# Patient Record
Sex: Female | Born: 1955 | Race: White | Hispanic: No | Marital: Married | Smoking: Current every day smoker
Health system: Southern US, Community
[De-identification: ages and names within clinical notes are randomized; demographics above are authoritative.]

## PROBLEM LIST (undated history)

## (undated) ENCOUNTER — Emergency Department (HOSPITAL_COMMUNITY): Discharge status: Absent without leave | Payer: Medicare Other

## (undated) DIAGNOSIS — F32A Depression, unspecified: Secondary | ICD-10-CM

## (undated) DIAGNOSIS — F1210 Cannabis abuse, uncomplicated: Secondary | ICD-10-CM

## (undated) DIAGNOSIS — Z973 Presence of spectacles and contact lenses: Secondary | ICD-10-CM

## (undated) DIAGNOSIS — R918 Other nonspecific abnormal finding of lung field: Secondary | ICD-10-CM

## (undated) DIAGNOSIS — G8929 Other chronic pain: Secondary | ICD-10-CM

## (undated) DIAGNOSIS — T148XXA Other injury of unspecified body region, initial encounter: Secondary | ICD-10-CM

## (undated) DIAGNOSIS — Z720 Tobacco use: Secondary | ICD-10-CM

## (undated) DIAGNOSIS — M545 Low back pain, unspecified: Secondary | ICD-10-CM

## (undated) DIAGNOSIS — M1990 Unspecified osteoarthritis, unspecified site: Secondary | ICD-10-CM

## (undated) DIAGNOSIS — M542 Cervicalgia: Secondary | ICD-10-CM

## (undated) DIAGNOSIS — R519 Headache, unspecified: Secondary | ICD-10-CM

## (undated) DIAGNOSIS — J449 Chronic obstructive pulmonary disease, unspecified: Secondary | ICD-10-CM

## (undated) DIAGNOSIS — Z972 Presence of dental prosthetic device (complete) (partial): Secondary | ICD-10-CM

## (undated) DIAGNOSIS — R0600 Dyspnea, unspecified: Secondary | ICD-10-CM

## (undated) DIAGNOSIS — M76899 Other specified enthesopathies of unspecified lower limb, excluding foot: Secondary | ICD-10-CM

## (undated) DIAGNOSIS — M79609 Pain in unspecified limb: Secondary | ICD-10-CM

## (undated) DIAGNOSIS — M1710 Unilateral primary osteoarthritis, unspecified knee: Secondary | ICD-10-CM

## (undated) DIAGNOSIS — N059 Unspecified nephritic syndrome with unspecified morphologic changes: Secondary | ICD-10-CM

## (undated) HISTORY — DX: Pain in unspecified limb: M79.609

## (undated) HISTORY — PX: TONSILLECTOMY: SUR1361

## (undated) HISTORY — PX: ABDOMINAL HYSTERECTOMY: SHX81

## (undated) HISTORY — DX: Other specified enthesopathies of unspecified lower limb, excluding foot: M76.899

## (undated) HISTORY — DX: Unilateral primary osteoarthritis, unspecified knee: M17.10

## (undated) HISTORY — DX: Low back pain, unspecified: M54.50

## (undated) HISTORY — DX: Cervicalgia: M54.2

## (undated) HISTORY — DX: Low back pain: M54.5

## (undated) HISTORY — PX: BREAST BIOPSY: SHX20

## (undated) HISTORY — PX: BACK SURGERY: SHX140

## (undated) HISTORY — PX: HEMORRHOID SURGERY: SHX153

---

## 1998-09-07 ENCOUNTER — Ambulatory Visit (HOSPITAL_COMMUNITY): Admission: RE | Admit: 1998-09-07 | Discharge: 1998-09-07 | Payer: Self-pay | Admitting: Orthopedic Surgery

## 1998-10-18 ENCOUNTER — Ambulatory Visit (HOSPITAL_COMMUNITY): Admission: RE | Admit: 1998-10-18 | Discharge: 1998-10-18 | Payer: Self-pay | Admitting: Orthopedic Surgery

## 1998-10-18 ENCOUNTER — Encounter: Payer: Self-pay | Admitting: Orthopedic Surgery

## 1999-07-27 ENCOUNTER — Encounter: Payer: Self-pay | Admitting: Orthopedic Surgery

## 1999-07-27 ENCOUNTER — Encounter (INDEPENDENT_AMBULATORY_CARE_PROVIDER_SITE_OTHER): Payer: Self-pay | Admitting: Specialist

## 1999-07-27 ENCOUNTER — Inpatient Hospital Stay (HOSPITAL_COMMUNITY): Admission: RE | Admit: 1999-07-27 | Discharge: 1999-07-30 | Payer: Self-pay | Admitting: Orthopedic Surgery

## 2000-05-09 ENCOUNTER — Other Ambulatory Visit: Admission: RE | Admit: 2000-05-09 | Discharge: 2000-05-09 | Payer: Self-pay | Admitting: Internal Medicine

## 2001-05-23 ENCOUNTER — Other Ambulatory Visit: Admission: RE | Admit: 2001-05-23 | Discharge: 2001-05-23 | Payer: Self-pay | Admitting: Internal Medicine

## 2001-11-05 ENCOUNTER — Encounter (INDEPENDENT_AMBULATORY_CARE_PROVIDER_SITE_OTHER): Payer: Self-pay | Admitting: Specialist

## 2001-11-05 ENCOUNTER — Encounter: Payer: Self-pay | Admitting: Orthopedic Surgery

## 2001-11-06 ENCOUNTER — Inpatient Hospital Stay (HOSPITAL_COMMUNITY): Admission: EM | Admit: 2001-11-06 | Discharge: 2001-11-07 | Payer: Self-pay | Admitting: Orthopedic Surgery

## 2002-07-26 ENCOUNTER — Emergency Department (HOSPITAL_COMMUNITY): Admission: EM | Admit: 2002-07-26 | Discharge: 2002-07-27 | Payer: Self-pay | Admitting: Emergency Medicine

## 2002-12-28 ENCOUNTER — Other Ambulatory Visit: Admission: RE | Admit: 2002-12-28 | Discharge: 2002-12-28 | Payer: Self-pay | Admitting: Obstetrics and Gynecology

## 2003-01-06 ENCOUNTER — Ambulatory Visit (HOSPITAL_COMMUNITY): Admission: RE | Admit: 2003-01-06 | Discharge: 2003-01-06 | Payer: Self-pay | Admitting: General Surgery

## 2003-01-06 ENCOUNTER — Encounter: Payer: Self-pay | Admitting: General Surgery

## 2003-01-26 ENCOUNTER — Encounter (INDEPENDENT_AMBULATORY_CARE_PROVIDER_SITE_OTHER): Payer: Self-pay

## 2003-01-26 ENCOUNTER — Ambulatory Visit (HOSPITAL_COMMUNITY): Admission: RE | Admit: 2003-01-26 | Discharge: 2003-01-26 | Payer: Self-pay | Admitting: General Surgery

## 2003-04-05 ENCOUNTER — Encounter: Admission: RE | Admit: 2003-04-05 | Discharge: 2003-04-05 | Payer: Self-pay | Admitting: Neurosurgery

## 2003-04-05 ENCOUNTER — Encounter: Payer: Self-pay | Admitting: Neurosurgery

## 2003-05-12 ENCOUNTER — Encounter: Payer: Self-pay | Admitting: Neurosurgery

## 2003-05-17 ENCOUNTER — Inpatient Hospital Stay (HOSPITAL_COMMUNITY): Admission: RE | Admit: 2003-05-17 | Discharge: 2003-05-21 | Payer: Self-pay | Admitting: Neurosurgery

## 2003-05-17 ENCOUNTER — Encounter: Payer: Self-pay | Admitting: Neurosurgery

## 2003-06-22 ENCOUNTER — Encounter: Admission: RE | Admit: 2003-06-22 | Discharge: 2003-06-22 | Payer: Self-pay | Admitting: Neurosurgery

## 2003-06-22 ENCOUNTER — Encounter: Payer: Self-pay | Admitting: Neurosurgery

## 2003-07-14 ENCOUNTER — Ambulatory Visit (HOSPITAL_COMMUNITY): Admission: RE | Admit: 2003-07-14 | Discharge: 2003-07-14 | Payer: Self-pay | Admitting: Neurosurgery

## 2003-08-02 ENCOUNTER — Other Ambulatory Visit: Admission: RE | Admit: 2003-08-02 | Discharge: 2003-08-02 | Payer: Self-pay | Admitting: Obstetrics and Gynecology

## 2003-08-26 ENCOUNTER — Encounter: Admission: RE | Admit: 2003-08-26 | Discharge: 2003-08-26 | Payer: Self-pay | Admitting: Neurosurgery

## 2003-08-26 ENCOUNTER — Encounter: Payer: Self-pay | Admitting: Neurosurgery

## 2003-12-28 ENCOUNTER — Encounter: Admission: RE | Admit: 2003-12-28 | Discharge: 2003-12-28 | Payer: Self-pay | Admitting: Neurosurgery

## 2004-08-22 ENCOUNTER — Emergency Department (HOSPITAL_COMMUNITY): Admission: EM | Admit: 2004-08-22 | Discharge: 2004-08-22 | Payer: Self-pay | Admitting: *Deleted

## 2004-08-24 ENCOUNTER — Other Ambulatory Visit: Admission: RE | Admit: 2004-08-24 | Discharge: 2004-08-24 | Payer: Self-pay | Admitting: Obstetrics and Gynecology

## 2005-05-14 ENCOUNTER — Ambulatory Visit (HOSPITAL_BASED_OUTPATIENT_CLINIC_OR_DEPARTMENT_OTHER): Admission: RE | Admit: 2005-05-14 | Discharge: 2005-05-14 | Payer: Self-pay | Admitting: Otolaryngology

## 2005-05-14 ENCOUNTER — Ambulatory Visit (HOSPITAL_COMMUNITY): Admission: RE | Admit: 2005-05-14 | Discharge: 2005-05-14 | Payer: Self-pay | Admitting: Otolaryngology

## 2005-05-14 ENCOUNTER — Encounter (INDEPENDENT_AMBULATORY_CARE_PROVIDER_SITE_OTHER): Payer: Self-pay | Admitting: Specialist

## 2005-09-10 ENCOUNTER — Other Ambulatory Visit: Admission: RE | Admit: 2005-09-10 | Discharge: 2005-09-10 | Payer: Self-pay | Admitting: Obstetrics and Gynecology

## 2005-11-26 ENCOUNTER — Ambulatory Visit: Payer: Self-pay | Admitting: Internal Medicine

## 2005-12-13 ENCOUNTER — Ambulatory Visit: Payer: Self-pay | Admitting: Internal Medicine

## 2006-09-26 ENCOUNTER — Other Ambulatory Visit: Admission: RE | Admit: 2006-09-26 | Discharge: 2006-09-26 | Payer: Self-pay | Admitting: Obstetrics and Gynecology

## 2007-06-24 ENCOUNTER — Ambulatory Visit (HOSPITAL_COMMUNITY): Admission: RE | Admit: 2007-06-24 | Discharge: 2007-06-24 | Payer: Self-pay | Admitting: Anesthesiology

## 2008-01-14 ENCOUNTER — Encounter: Admission: RE | Admit: 2008-01-14 | Discharge: 2008-01-14 | Payer: Self-pay | Admitting: Internal Medicine

## 2008-03-15 ENCOUNTER — Emergency Department (HOSPITAL_COMMUNITY): Admission: EM | Admit: 2008-03-15 | Discharge: 2008-03-15 | Payer: Self-pay | Admitting: Family Medicine

## 2008-12-20 ENCOUNTER — Encounter: Admission: RE | Admit: 2008-12-20 | Discharge: 2008-12-20 | Payer: Self-pay | Admitting: Internal Medicine

## 2009-11-29 DIAGNOSIS — M489 Spondylopathy, unspecified: Secondary | ICD-10-CM | POA: Insufficient documentation

## 2009-11-29 DIAGNOSIS — F419 Anxiety disorder, unspecified: Secondary | ICD-10-CM | POA: Insufficient documentation

## 2009-11-29 DIAGNOSIS — E785 Hyperlipidemia, unspecified: Secondary | ICD-10-CM | POA: Insufficient documentation

## 2010-03-28 ENCOUNTER — Ambulatory Visit: Payer: Self-pay | Admitting: Internal Medicine

## 2010-04-06 ENCOUNTER — Ambulatory Visit (HOSPITAL_COMMUNITY): Admission: RE | Admit: 2010-04-06 | Discharge: 2010-04-06 | Payer: Self-pay | Admitting: Internal Medicine

## 2010-06-28 ENCOUNTER — Ambulatory Visit: Payer: Self-pay | Admitting: Internal Medicine

## 2010-09-01 ENCOUNTER — Encounter (INDEPENDENT_AMBULATORY_CARE_PROVIDER_SITE_OTHER): Payer: Self-pay | Admitting: Family Medicine

## 2010-09-01 ENCOUNTER — Ambulatory Visit: Payer: Self-pay | Admitting: Internal Medicine

## 2010-09-01 LAB — CONVERTED CEMR LAB
ALT: <8 units/L (ref 0–35)
AST: 12 units/L (ref 0–37)
Basophils Absolute: 0 10*3/uL (ref 0.0–0.1)
Basophils Relative: 0 % (ref 0–1)
Cholesterol: 157 mg/dL (ref 0–200)
Creatinine, Ser: 0.53 mg/dL (ref 0.40–1.20)
Eosinophils Absolute: 0.1 10*3/uL (ref 0.0–0.7)
Eosinophils Relative: 2 % (ref 0–5)
HCT: 43.8 % (ref 36.0–46.0)
Hemoglobin: 14.5 g/dL (ref 12.0–15.0)
MCHC: 33.1 g/dL (ref 30.0–36.0)
Monocytes Absolute: 0.5 10*3/uL (ref 0.1–1.0)
RDW: 13 % (ref 11.5–15.5)
TSH: 1.064 microintl units/mL (ref 0.350–4.500)
Total Bilirubin: 0.3 mg/dL (ref 0.3–1.2)
Total CHOL/HDL Ratio: 3.4
VLDL: 28 mg/dL (ref 0–40)
Vitamin B-12: >2000 pg/mL — ABNORMAL HIGH (ref 211–911)

## 2010-09-18 ENCOUNTER — Ambulatory Visit (HOSPITAL_COMMUNITY): Admission: RE | Admit: 2010-09-18 | Discharge: 2010-09-18 | Payer: Self-pay | Admitting: Internal Medicine

## 2010-10-27 ENCOUNTER — Encounter
Admission: RE | Admit: 2010-10-27 | Discharge: 2010-12-13 | Payer: Self-pay | Source: Home / Self Care | Attending: Physical Medicine and Rehabilitation | Admitting: Physical Medicine and Rehabilitation

## 2010-11-01 ENCOUNTER — Ambulatory Visit: Payer: Self-pay | Admitting: Physical Medicine and Rehabilitation

## 2010-11-15 ENCOUNTER — Ambulatory Visit: Payer: Self-pay | Admitting: Physical Medicine and Rehabilitation

## 2010-12-13 ENCOUNTER — Ambulatory Visit: Payer: Self-pay | Admitting: Physical Medicine and Rehabilitation

## 2011-01-19 ENCOUNTER — Encounter
Admission: RE | Admit: 2011-01-19 | Discharge: 2011-01-26 | Payer: Self-pay | Source: Home / Self Care | Attending: Physical Medicine and Rehabilitation | Admitting: Physical Medicine and Rehabilitation

## 2011-01-26 ENCOUNTER — Ambulatory Visit
Admission: RE | Admit: 2011-01-26 | Discharge: 2011-01-26 | Payer: Self-pay | Source: Home / Self Care | Attending: Physical Medicine and Rehabilitation | Admitting: Physical Medicine and Rehabilitation

## 2011-02-26 ENCOUNTER — Encounter: Payer: Self-pay | Attending: Physical Medicine and Rehabilitation

## 2011-02-26 ENCOUNTER — Ambulatory Visit (HOSPITAL_BASED_OUTPATIENT_CLINIC_OR_DEPARTMENT_OTHER): Payer: Self-pay | Admitting: Physical Medicine and Rehabilitation

## 2011-02-26 DIAGNOSIS — K5900 Constipation, unspecified: Secondary | ICD-10-CM | POA: Insufficient documentation

## 2011-02-26 DIAGNOSIS — M1710 Unilateral primary osteoarthritis, unspecified knee: Secondary | ICD-10-CM

## 2011-02-26 DIAGNOSIS — M542 Cervicalgia: Secondary | ICD-10-CM

## 2011-02-26 DIAGNOSIS — F17200 Nicotine dependence, unspecified, uncomplicated: Secondary | ICD-10-CM | POA: Insufficient documentation

## 2011-02-26 DIAGNOSIS — M545 Low back pain, unspecified: Secondary | ICD-10-CM

## 2011-02-26 DIAGNOSIS — G894 Chronic pain syndrome: Secondary | ICD-10-CM

## 2011-02-26 DIAGNOSIS — R059 Cough, unspecified: Secondary | ICD-10-CM | POA: Insufficient documentation

## 2011-02-26 DIAGNOSIS — R05 Cough: Secondary | ICD-10-CM | POA: Insufficient documentation

## 2011-02-26 DIAGNOSIS — M76899 Other specified enthesopathies of unspecified lower limb, excluding foot: Secondary | ICD-10-CM | POA: Insufficient documentation

## 2011-02-26 DIAGNOSIS — G5780 Other specified mononeuropathies of unspecified lower limb: Secondary | ICD-10-CM | POA: Insufficient documentation

## 2011-03-27 ENCOUNTER — Ambulatory Visit: Payer: Self-pay

## 2011-03-27 ENCOUNTER — Ambulatory Visit (HOSPITAL_BASED_OUTPATIENT_CLINIC_OR_DEPARTMENT_OTHER): Payer: Self-pay

## 2011-03-27 ENCOUNTER — Encounter: Payer: Self-pay | Attending: Physical Medicine and Rehabilitation

## 2011-03-27 DIAGNOSIS — M545 Low back pain, unspecified: Secondary | ICD-10-CM | POA: Insufficient documentation

## 2011-03-27 DIAGNOSIS — M1710 Unilateral primary osteoarthritis, unspecified knee: Secondary | ICD-10-CM | POA: Insufficient documentation

## 2011-03-27 DIAGNOSIS — Z79899 Other long term (current) drug therapy: Secondary | ICD-10-CM | POA: Insufficient documentation

## 2011-03-27 DIAGNOSIS — M542 Cervicalgia: Secondary | ICD-10-CM | POA: Insufficient documentation

## 2011-03-27 DIAGNOSIS — M25569 Pain in unspecified knee: Secondary | ICD-10-CM | POA: Insufficient documentation

## 2011-03-27 DIAGNOSIS — F17200 Nicotine dependence, unspecified, uncomplicated: Secondary | ICD-10-CM | POA: Insufficient documentation

## 2011-03-27 DIAGNOSIS — G8929 Other chronic pain: Secondary | ICD-10-CM | POA: Insufficient documentation

## 2011-03-27 DIAGNOSIS — M546 Pain in thoracic spine: Secondary | ICD-10-CM | POA: Insufficient documentation

## 2011-03-27 DIAGNOSIS — Z981 Arthrodesis status: Secondary | ICD-10-CM | POA: Insufficient documentation

## 2011-03-27 DIAGNOSIS — M2040 Other hammer toe(s) (acquired), unspecified foot: Secondary | ICD-10-CM | POA: Insufficient documentation

## 2011-03-27 DIAGNOSIS — M79609 Pain in unspecified limb: Secondary | ICD-10-CM

## 2011-04-24 ENCOUNTER — Encounter: Payer: Self-pay | Attending: Physical Medicine and Rehabilitation

## 2011-04-24 DIAGNOSIS — M25569 Pain in unspecified knee: Secondary | ICD-10-CM | POA: Insufficient documentation

## 2011-04-24 DIAGNOSIS — M545 Low back pain, unspecified: Secondary | ICD-10-CM | POA: Insufficient documentation

## 2011-04-24 DIAGNOSIS — M542 Cervicalgia: Secondary | ICD-10-CM

## 2011-04-24 DIAGNOSIS — Z981 Arthrodesis status: Secondary | ICD-10-CM | POA: Insufficient documentation

## 2011-04-24 DIAGNOSIS — M546 Pain in thoracic spine: Secondary | ICD-10-CM | POA: Insufficient documentation

## 2011-04-24 DIAGNOSIS — Z79899 Other long term (current) drug therapy: Secondary | ICD-10-CM | POA: Insufficient documentation

## 2011-04-24 DIAGNOSIS — G8929 Other chronic pain: Secondary | ICD-10-CM | POA: Insufficient documentation

## 2011-04-24 DIAGNOSIS — M1710 Unilateral primary osteoarthritis, unspecified knee: Secondary | ICD-10-CM

## 2011-04-24 DIAGNOSIS — M79609 Pain in unspecified limb: Secondary | ICD-10-CM | POA: Insufficient documentation

## 2011-05-18 NOTE — Op Note (Signed)
NAMEMEGHNA, HAGMANN              ACCOUNT NO.:  [id number]   MEDICAL RECORD NO.:  [id number]          PATIENT TYPE:  AMB   LOCATION:  DSC                          FACILITY:  [facility]   PHYSICIAN:  Jefry H. Pollyann Kennedy, MD     DATE OF BIRTH:  [birth year]   DATE OF PROCEDURE:  05/14/2005  DATE OF DISCHARGE:                                 OPERATIVE REPORT   PREOPERATIVE DIAGNOSIS:  Right attic cholesteatoma.   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   PROCEDURE:  Right tympanoplasty with mastoidectomy canal wall up.   SURGEON:  Dr. Pollyann Kennedy   ANESTHESIA:  General endotracheal anesthesia was used.   COMPLICATIONS:  None.   FINDINGS:  Large cholesteatoma sac with erosion of the scutum.  The sac was  filling the attic and the aditus and part of the superior mastoid cavity as  well as the anterior epitympanum.  The cholesteatoma was all lateral to the  body of the incus.  The ossicular chain was intact.  There were no  dehiscences of the otic capsule, tegmen, or the facial nerve.   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  She has a history of eustachian tube dysfunction in the past and  a had ventilation tube in that right ear several years back.  Risks,  benefits, alternatives, and complications of the procedure were explained to  the patient, and she did understand and agreed to surgery.   PROCEDURE:  The patient was taken to the operating room and placed on the  operating table in a supine position.  Following induction of general  endotracheal anesthesia, table was turned, and the patient was draped in a  standard fashion.  The right ear was draped for otologic surgery.  The  microscope was brought into view.  Xylocaine with epinephrine was  infiltrated into four quadrants of the external auditory canal as well as  the post auricular  sulcus.  Radial canal incisions were created with a  sickle knife at 4 o'clock and 8 o'clock.  A round knife was used to connect  the incisions lateral to the annulus.  The skin was very thin and inflamed  in this area, and the skin tore very easily during the incisions.  Postauricular incision was created, and the ear was dissected forward.  A  graft was harvested, loose areolar tissue superficial to the temporalis  fascia.  This was pressed and dried on the back table.  The linea and  temporalis and mastoid periosteum were incised with the scalpel, and the ear  was brought forward with the periosteum and the vascular strip.  Perkins  retractor was used to secure the flap in place.  The ear canal and drum were  then examined.  A large amount of crusting material was carefully dissected  off of the attic area using an otologic pick.  Behind this was a large  amount of cholesteatoma matrix.  A round knife was used to elevate the  tympanomeatal flap off the annulus.  The chorda tympani nerve was identified  and preserved.  The hypotympanum and mesotympanum were clear.  The  epitympanum was where all the disease was localized to.  The incus long  process, malleus long process, and stapes round window and oval window were  all clear of any disease.  The mastoidectomy was then accomplished.  A large  cutting bur was used to open the mastoid cortex.  The air cells were  entered.  The canal wall was preserved.  The cholesteatoma matrix was  identified in the aditus area, and this area was opened widely up to the  tegmen, keeping the tegmen intact and anteriorly into the root of the  zygoma.  Down on the canal wall as well, the bridge was opened, and the  facial recess was explored as well.  The external genu of the facial nerve  was without dehiscence, and the disease was all lateral to this and  superior.  The mastoid sac was carefully dissected out of the aditus and the  attic area.  The sac was  dissected off of the body of the incus without any  evidence of localization medial to the incus.  The anterior epitympanum was  also dissected free of all cholesteatoma matrix.  A large amount of matrix  was sent for pathologic evaluation.  Careful inspection revealed no further  evidence of epithelium in the area.  The mastoid cavity was then polished  with various sizes of diamond burs, clearing out all granulation tissue and  then planed mucosal tissue.  The mastoid cavity and middle ear were packed  with saline-soaked Gelfoam.  The graft was then placed medial to the  tympanic membrane remnant and up, covering the entire atticotomy defect up  onto the ear canal bone.  The native tympanomeatal flap was replaced to its  normal position.  The ear canal was packed with Ciprodex-soaked Gelfoam.  The mastoid periosteum was reapproximated with chromic suture.  The  postauricular incision was reapproximated with a chromic suture in a  subcuticular fashion.  Benzoin and Steri-Strips were applied.  The ear  canals then reinspected, and a vascular strip was laid down on the posterior  ear canal bone.  An additional load Gelfoam packing was used.  The external  meatus was then packed with Bacitracin and a cotton ball, and a mastoid  dressing was applied.  The patient was then awakened, extubated, and  transferred to recovery in stable condition.      JHR/MEDQ  D:  05/14/2005  T:  05/14/2005  Job:  [id number]

## 2011-05-18 NOTE — Op Note (Signed)
[facility]  Patient:    Alyssa [name], Alyssa [name] Visit Number: [id number] MRN: [id number]          Service Type: SUR Location: 4W [id number] 01 Attending Physician:  Skip Mayer Dictated by:   Georges Lynch Darrelyn Hillock, M.D. Proc. Date: 11/05/01 Admit Date:  11/05/2001                             Operative Report  PREOPERATIVE DIAGNOSES: 1. Large herniated disk, central and to the right at the L3-4. 2. Spinal stenosis at L3-4. 3. Previous central decompressive lumbar laminectomy and diskectomy at L4-5 by    Dr. Fannie Knee a few years ago.  POSTOPERATIVE DIAGNOSES: 1. Large herniated disk, central and to the right at the L3-4. 2. Spinal stenosis at L3-4. 3. Previous central decompressive lumbar laminectomy and diskectomy at L4-5 by    Dr. Fannie Knee a few years ago.  OPERATION: 1. Complete bilateral decompressive lumbar laminectomy at L3-4. 2. Foraminotomy at L3-4 bilaterally. 3. Microdiskectomy at L3-4 on the right.  SURGEON:  Georges Lynch. Darrelyn Hillock, M.D.  ASSISTANT:  Javier Docker, M.D.  DESCRIPTION OF PROCEDURE:  Under general anesthesia, the patient on the spinal frame, routine orthopedic prepping and draping of the back was carried out. She had 1 g of IV Ancef.  At this time, two needles were placed in the back for localization purposes.  Note, she did have previous surgery as I mentioned before by Dr. Fannie Knee a few years ago.  At this time, we went down and made the incision in the lower lumbar region after she had 1 g of IV Ancef.  Following this, the muscle was stripped from the lamina of L3 and L4 region, and we then inserted self-retaining retractors, and several x-rays were taken to verify our exact position.  We went down and removed the remaining part of the spinous process of L3, and a portion of 2 above, and went distally to remove all the scar tissue, and we did a complete bilateral decompressive lumbar laminectomy at L3-4.  There was quite a bit of  scarring over the dura.  We easily removed that, and removed the remaining ligamentum flavum in that area.  She had a marked thickening of the ligamentum flavum that was contributing to her spinal stenosis.  We experienced a sufficient amount of venous bleeding from the lateral recesses, and we were able to easily cauterize the veins with a bipolar.  We gently retracted the dura, identified the root at L3-4, and noted a large extruded disk.  We were able to easily tease out with the nerve hook.  Once this was done, and the nerve root was freed up, we then packed the area above and below with cottonoid, and went down and made a cruciate incision in the posterior longitudinal ligament, and did a complete diskectomy.  We went completely across the midline.  We utilized the hockey stick and the Epstein curet to clean out the space.  After this was done, we were able to easily pass the hockey stick up under the dura, as well as down into the foramen.  We also decompressed the opposite left side as well, but we did just decompress the lateral recess on the opposite side.  We thoroughly irrigated out the area.  We had good hemostasis. Several passes were made into the disk space to make sure there were no other loose pieces of disk in the space or  outside the space.  We then loosely applied some thrombin soaked Gelfoam.  We had good hemostasis.  We closed the wound in layers in the usual fashion.  A sterile Neosporin dressing was applied. Dictated by:   Georges Lynch Darrelyn Hillock, M.D. Attending Physician:  Skip Mayer DD:  11/05/01 TD:  11/06/01 Job: [id number]

## 2011-05-18 NOTE — H&P (Signed)
[facility]  Patient:    Alyssa [name], Alyssa [name] Visit Number: [id number] MRN: [id number]          Service Type: Attending:  Georges Lynch. Darrelyn Hillock, M.D. Dictated by:   Marcie Bal Troncale, P.A.C. Adm. Date:  11/05/01                           History and Physical  DATE OF BIRTH:  1956-03-02  SOCIAL SECURITY NUMBER:  [phone]  CHIEF COMPLAINT:  Low back pain.  HISTORY OF PRESENT ILLNESS:  Alyssa [name] is a 55 year old female who presents with complaints of lower back pain.  She has previously undergone a central decompressive laminectomy by Dr. Paul Dykes. Grant Ruts. at level L4-5 in the year 2000.  She had done well after this surgery.  However, November 13th of this summer, she ended up tripping and falling, re-injuring her lower back. She began having lower back pain as well as radicular symptoms into her right more than left leg, with numbing, tingling and some pain.  Because of her findings, she underwent an MRI study which showed a focal right-sided paracentral disk protrusion at L3-4 which was encroaching the nerve roots within the canal.  Because of this finding and her clinical symptoms, it was felt she may benefit from surgical intervention.  REVIEW OF SYSTEMS:  She reports a history of migraine headaches.  No fever or chills.  No diplopia or blurred vision.  Does report insomnia.  Currently having some rhinorrhea she feels secondary to her allergies.  No earache or sore throat.  No chest pain, shortness of breath or cough.  No abdominal pain, nausea, vomiting or diarrhea.  Does report constipation secondary to pain medications.  No melena or bright red blood per rectum.  No urinary frequency, dysuria or hematuria.  PAST MEDICAL HISTORY:  Significant for Brights disease (glomerulonephritis at age 51); also reports "nerves."  Denies CAD, MI, stroke, seizures, cancer, diabetes, hypertension or lung disease.  ALLERGIES:  CELEBREX, causing it to "hurt  her stomach."  CURRENT MEDICATIONS: 1. Vioxx. 2. Robaxin. 3. Percocet, which she thinks may be giving her nightmares. 4. Xanax 0.5 mg -- she takes one during the day and one at night.  PAST SURGICAL HISTORY:  As per the HPI as well as a hysterectomy and hemorrhoid and fistula surgery.  FAMILY HISTORY:  Significant for hypertension in her father.  Mother had arthritis, thyroid disorder and heart disease.  SOCIAL HISTORY:  She smokes a half a pack of cigarettes a day.  She denies alcohol use.  She is married and has two adult children who are alive and well.  Patient is a homemaker.  Dr. Pearletha Furl. Jacky Kindle is her medical physician.  PHYSICAL EXAMINATION:  VITALS:  She is afebrile.  Pulse 78.  Respiratory rate 16.  Blood pressure 130/88.  GENERAL:  This is a well-developed, well-nourished female in no acute distress.  HEENT:  Head is normocephalic, atraumatic.  Pupils are equal, round and reactive to light and extraocular movements are grossly intact.  Oropharynx clear without redness, exudate or lesions.  She does have dentures.  NECK:  Supple without no cervical lymphadenopathy.  CHEST:  Clear to auscultation bilaterally with no wheezes or crackles.  HEART:  Regular rate and rhythm with no murmurs, rubs, or gallops.  ABDOMEN:  Soft, nontender, nondistended, with no masses.  She has positive bowel sounds.  BREASTS:  Not examined as not pertinent to present illness.  GENITOURINARY:  Not examined as not pertinent to present illness.  EXTREMITIES:  She has a positive straight leg raise in both lower extremities, 2+ DP and PT pulses, 5/5 motor strength in both lower extremities.  Sensation is grossly intact to light touch.  IMPRESSION: 1. Herniated nucleus pulposus, L3-4. 2. "Nerves." 3. History of glomerulonephritis.  PLAN:  Patient will be admitted to [facility] to undergo a central laminectomy and microdiskectomy, L3-4, on November 05, 2001 by Dr. Georges Lynch. Gioffre.  Preoperative labs and signed surgical consents will be obtained.  All questions have been encouraged and answered. Dictated by:   Marcie Bal Troncale, P.A.C. Attending:  Georges Lynch. Darrelyn Hillock, M.D. DD:  10/30/01 TD:  10/30/01 Job: [id number]

## 2011-05-18 NOTE — Op Note (Signed)
[name], Alyssa [name]                        ACCOUNT NO.:  [id number]   MEDICAL RECORD NO.:  [id number]                   PATIENT TYPE:  AMB   LOCATION:  DAY                                  FACILITY:  [facility]   PHYSICIAN:  Timothy E. Earlene Plater, M.D.              DATE OF BIRTH:  November 02, 1956   DATE OF PROCEDURE:  01/26/2003  DATE OF DISCHARGE:                                 OPERATIVE REPORT   PREOPERATIVE DIAGNOSIS:  Mass, right breast.   POSTOPERATIVE DIAGNOSIS:  Mass, right breast.   PROCEDURE:  Excision of mass, right breast.   SURGEON:  Timothy E. Earlene Plater, M.D.   ANESTHESIA:  Local with standby.   INDICATIONS:  The patient is 65 and presented with a new painful mass in the  right breast.  Mammograms are negative.  CT scans of the chest and axilla  were, otherwise, unremarkable and because of her fear of cancer and the  presence of a painful mass, we elected to proceed with an excisional biopsy  at this time.  She is in full agreement.  She was seen, evaluated by  anesthesia and identified, and the site marked in the permanent zone.   DESCRIPTION OF PROCEDURE:  She was taken to the operating room, placed  supine and IV sedation given.  She was turned into the correct position and  the right breast was prepped and draped in the usual fashion.  Marcaine  0.25% with epinephrine was used throughout for local anesthesia.  The mass  was located high in the left upper outer quadrant in the tail of the breast  at the junction of the breast and axilla.   A curvilinear incision made in the skin lines, the subcutaneous tissue  dissected, the deep tissue identified.  Thickened fibroglandular tissue was  identified and this was completely removed in two segments.  Bleeding was  controlled with cautery.  Careful bimanual exploration of the wound and  axilla revealed no other questionable areas.  Bleeding controlled, the wound  closed in layers with 3-0 Monocryl, Steri-Strips applied, counts  correct,  and a dry sterile dressing applied.   She was removed to the recovery room in good condition.  Written verbal  instructions were given.  I gave her Darvocet-N 100 mg, #36, for pain. She  is on chronic pain medications, otherwise.  She will see Dr. Thyra Breed  tomorrow and return to Dr. Fredia Beets for her routine care.  Followup in my  office in two weeks.                                               Timothy E. Earlene Plater, M.D.    TED/MEDQ  D:  01/26/2003  T:  01/26/2003  Job:  [id number]   cc:   Geoffry Paradise, M.D.  Lavina Hamman, M.D.   Thyra Breed, M.D.

## 2011-05-18 NOTE — Discharge Summary (Signed)
   Alyssa [name], Alyssa [name]                        ACCOUNT NO.:  [id number]   MEDICAL RECORD NO.:  [id number]                   PATIENT TYPE:  INP   LOCATION:  3013                                 FACILITY:  [facility]   PHYSICIAN:  Kathaleen Maser. Pool, M.D.                 DATE OF BIRTH:  October 17, 1956   DATE OF ADMISSION:  05/17/2003  DATE OF DISCHARGE:  05/21/2003                                 DISCHARGE SUMMARY   SERVICE:  Neurosurgery.   FINAL DIAGNOSIS:  L3-4, L4-5, and L5-S1 degenerative disk disease/stenosis  with chronic pain.   OPERATIONS, TREATMENTS:  L3-4, L4-5, and L5-S1 decompression and fusion with  instrumentation.   HOSPITAL COURSE:  [name] was admitted to the hospital and taken to the  operating room where she underwent an uncomplicated L3-4, L4-5, and L5-S1  decompression and fusion with instrumentation.  Postoperatively she did very  well.  Her back and lower extremity pain are much improved.  Strength and  sensation are intact.  The wound is healing well.   CONDITION ON DISCHARGE:  Improved.   DISCHARGE DISPOSITION:  The patient will follow up in one week in my office.                                               Henry A. Pool, M.D.    HAP/MEDQ  D:  06/23/2003  T:  06/24/2003  Job:  [id number]

## 2011-05-18 NOTE — Op Note (Signed)
NAMEALANYA, VUKELICH                        ACCOUNT NO.:  [id number]   MEDICAL RECORD NO.:  [id number]                   PATIENT TYPE:  INP   LOCATION:  3172                                 FACILITY:  [facility]   PHYSICIAN:  Kathaleen Maser. Pool, M.D.                 DATE OF BIRTH:  [birth year]   DATE OF PROCEDURE:  05/17/2003  DATE OF DISCHARGE:                                 OPERATIVE REPORT   PREOPERATIVE DIAGNOSIS:  L3-4, L4-5 and L5-S1 degenerative disk disease with  instability and chronic pain.   POSTOPERATIVE DIAGNOSIS:  L3-4, L4-5 and L5-S1 degenerative disk disease  with instability and chronic pain.   OPERATION PERFORMED:  Re-exploration of L3-4 laminectomy with redo  laminectomy and foraminotomies.  L4-5 re-exploration of laminectomy with  decompressive foraminotomy.  L5-S1 decompressive laminectomy with  foraminotomy.  L3-4, L4-5 and L5-S1 posterior lumbar interbody fusion  utilizing tangent wedges and local autograft.  L3 through S1 posterolateral  fusion utilizing pedicle screw instrumentation and local autograft.   SURGEON:  Kathaleen Maser. Pool, M.D.   ASSISTANT:  Reinaldo Meeker, M.D.   ANESTHESIA:  General endotracheal.   INDICATIONS FOR PROCEDURE:  The patient is a 55 year old female with a  history of two previous lumbar laminectomies with chronic pain and radicular  symptoms failing all conservative management.  The patient has evidence of  severe facet arthropathy at L3-4 and L4-5 consistent with instability.  She  has severe degenerative disk disease at L3-4, L4-5 and most strikingly at L5-  S1.  We have discussed options available for management including the  possibility of undergoing three-level decompression, fusion and  instrumentation for hopeful improvement of the symptoms.  The patient is  aware of the possible risks and benefits of the surgery.  She wishes to  proceed with surgery.   DESCRIPTION OF PROCEDURE:  The patient was taken to the operating room  and  placed on the table in the supine position.  After adequate level of  anesthesia was achieved, the patient was positioned prone onto a Wilson  frame and appropriately padded.  The patient's lumbar region was prepped and  draped sterilely.  A 10 blade was used to make a linear skin incision  extending from L2 down to S1.  This was carried down sharply in the midline.  A subperiosteal dissection was then performed exposing the lamina of L2, L3,  L4, L5 and S1.  Deep self-retaining retractor was placed.  Intraoperative  fluoroscopy was used, the levels were confirmed.  The transverse processes  of L3, L4, L5 and the sacral ala were then dissected free bilaterally.  The  patient's previous laminectomy defects at L3-4 and L4-5 were dissected free  using dental instruments and the electrocautery.  Complete laminectomy was  then performed at L5 using Leksell rongeurs, Kerrison rongeurs and the high  speed drill to remove the entire lamina of L5 and the inferior  facet joint  complex at L5 bilaterally.  __________ facetectomy at S1 were also  performed.  Ligamentum flavum was then elevated and resected in piecemeal  fashion using Kerrison rongeurs.  The underlying thecal sac and exiting L5  and S1 nerve roots were identified.  Wide decompressive foraminotomies were  performed along the exiting course of the nerve roots.  Decompression then  proceeded to the L4-5 level.  The patient's wide laminectomy defect was  dissected free.  Residual lamina was removed using Leksell rongeurs,  Kerrison rongeurs and high speed drill.  Residual inferior facet joints were  removed bilaterally at L4 as well as residual L5 superior facet joint  complex.  Epidural scar was dissected free and removed.  The underlying  thecal sac and exiting L4 and L5 nerve roots were identified.  wide  foraminotomies were performed along the course of the exiting nerve roots.  Attention was then placed at L3-4.  Once again,  epidural scar was gradually  resected.  Complete laminectomy at L3 was performed as were complete  inferior facetectomies at L3.  Superior facetectomies at L4 were performed  bilaterally.  Ligamentum flavum and scar was elevated and resected piecemeal  fashion.  Underlying thecal sac and exiting L3 and L4 nerve roots were  identified and widely decompressed.  The epidural venous plexus was  coagulated and cut at all three levels.  Bilateral diskectomies were  performed at all three levels without difficulty.  Disk spaces were then  distracted at L3-4 on the left.  Thecal sac and nerve roots were protected  on the right.  Disk space was then reamed and then cut with a 10 mm tangent  chisel.  Soft tissues were removed from the interspace.  A 10 x 26 mm  tangent wedge was then impacted into place and recessed approximately 1 mm  from the posterior cortical margin.  Distractor was removed from the  patient's left side.  The thecal sac and nerve roots were then protected on  the left side and the disk space was once again cut with a 10 mm box cutter  and then cut with a 10 mm chisel.  Soft tissue was removed.  Morselized  autograft was packed in the interspace.  A second 5 mm tangent wedge was  then placed and recessed approximately 2 mm posterior to the cortical  surface.  The procedure was then repeated at the L4-5 level and then later  repeated at the L5-S1 level, this time using 8 mm tangent wedges at L5-S1.  Transverse processes of L3, 4, 5, and S1 were isolated.  The pedicles were  identified using surface landmarks and intraoperative fluoroscopy.  Superficial bone overlying the pedicle was removed using the high speed  drill.  Each pedicle was then probed using pedicle awl.  Each pedicle awl  track was then tapped with a 5.25 mm screw tap.  6.75 x 45 mm pedicle screws  were placed at L3, 6.75 x 40 mm screws were placed at L4, L5.  6.75 x 35 mm screws were placed bilaterally at S1.  The  transverse processes and sacral  ala were then decorticated using a high speed drill.  Morselized autograft  was packed posterolaterally.  A titanium rod was then cut and contoured and  placed over the screw head of L3 to S1.  This was then attached to the screw  heads using locking caps.  The locking caps were then engaged in sequential  fashion with the construct under  compression.  A transverse connector was  placed.  Final images revealed good position of bone grafts and hardware at  the proper operative level with normal alignment of the spine.  The wound  was then irrigated with antibiotic solution.  Gelfoam was placed topically  for hemostasis which was found to be good.  The wound was then closed in  layers with Vicryl sutures.  A medium Hemovac drain was left in the epidural  space.  Steri-Strips and sterile dressing were applied.  There were no  complications.  The patient tolerated the procedure well and returned to the  recovery room postoperatively.                                                Henry A. Pool, M.D.    HAP/MEDQ  D:  05/17/2003  T:  05/17/2003  Job:  [id number]

## 2011-05-21 ENCOUNTER — Encounter: Payer: Self-pay | Attending: Physical Medicine and Rehabilitation | Admitting: Physical Medicine and Rehabilitation

## 2011-05-21 DIAGNOSIS — G8929 Other chronic pain: Secondary | ICD-10-CM | POA: Insufficient documentation

## 2011-05-21 DIAGNOSIS — M545 Low back pain, unspecified: Secondary | ICD-10-CM

## 2011-05-21 DIAGNOSIS — M542 Cervicalgia: Secondary | ICD-10-CM

## 2011-05-21 DIAGNOSIS — M2040 Other hammer toe(s) (acquired), unspecified foot: Secondary | ICD-10-CM | POA: Insufficient documentation

## 2011-05-21 DIAGNOSIS — M79609 Pain in unspecified limb: Secondary | ICD-10-CM | POA: Insufficient documentation

## 2011-05-21 DIAGNOSIS — M546 Pain in thoracic spine: Secondary | ICD-10-CM | POA: Insufficient documentation

## 2011-05-21 DIAGNOSIS — M76899 Other specified enthesopathies of unspecified lower limb, excluding foot: Secondary | ICD-10-CM | POA: Insufficient documentation

## 2011-05-21 DIAGNOSIS — R252 Cramp and spasm: Secondary | ICD-10-CM | POA: Insufficient documentation

## 2011-05-21 DIAGNOSIS — M25569 Pain in unspecified knee: Secondary | ICD-10-CM | POA: Insufficient documentation

## 2011-05-21 DIAGNOSIS — F17200 Nicotine dependence, unspecified, uncomplicated: Secondary | ICD-10-CM | POA: Insufficient documentation
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
## 2011-06-19 ENCOUNTER — Encounter: Payer: Self-pay | Attending: Physical Medicine and Rehabilitation

## 2011-06-19 DIAGNOSIS — M545 Low back pain, unspecified: Secondary | ICD-10-CM

## 2011-06-19 DIAGNOSIS — M2040 Other hammer toe(s) (acquired), unspecified foot: Secondary | ICD-10-CM | POA: Insufficient documentation

## 2011-06-19 DIAGNOSIS — R209 Unspecified disturbances of skin sensation: Secondary | ICD-10-CM | POA: Insufficient documentation

## 2011-06-19 DIAGNOSIS — M549 Dorsalgia, unspecified: Secondary | ICD-10-CM | POA: Insufficient documentation

## 2011-06-19 DIAGNOSIS — F341 Dysthymic disorder: Secondary | ICD-10-CM | POA: Insufficient documentation

## 2011-06-19 DIAGNOSIS — M25569 Pain in unspecified knee: Secondary | ICD-10-CM | POA: Insufficient documentation

## 2011-06-19 DIAGNOSIS — M542 Cervicalgia: Secondary | ICD-10-CM

## 2011-06-19 DIAGNOSIS — M1710 Unilateral primary osteoarthritis, unspecified knee: Secondary | ICD-10-CM | POA: Insufficient documentation

## 2011-06-19 DIAGNOSIS — M62838 Other muscle spasm: Secondary | ICD-10-CM | POA: Insufficient documentation

## 2011-06-19 DIAGNOSIS — M79609 Pain in unspecified limb: Secondary | ICD-10-CM

## 2011-07-17 ENCOUNTER — Encounter: Payer: Self-pay | Attending: Neurosurgery | Admitting: Neurosurgery

## 2011-07-17 DIAGNOSIS — M961 Postlaminectomy syndrome, not elsewhere classified: Secondary | ICD-10-CM

## 2011-07-17 DIAGNOSIS — M1710 Unilateral primary osteoarthritis, unspecified knee: Secondary | ICD-10-CM

## 2011-07-17 DIAGNOSIS — G8929 Other chronic pain: Secondary | ICD-10-CM | POA: Insufficient documentation

## 2011-07-17 DIAGNOSIS — M542 Cervicalgia: Secondary | ICD-10-CM | POA: Insufficient documentation

## 2011-07-17 DIAGNOSIS — M25579 Pain in unspecified ankle and joints of unspecified foot: Secondary | ICD-10-CM | POA: Insufficient documentation

## 2011-07-17 DIAGNOSIS — M545 Low back pain, unspecified: Secondary | ICD-10-CM | POA: Insufficient documentation

## 2011-07-17 DIAGNOSIS — Z981 Arthrodesis status: Secondary | ICD-10-CM | POA: Insufficient documentation

## 2011-07-17 DIAGNOSIS — M25569 Pain in unspecified knee: Secondary | ICD-10-CM | POA: Insufficient documentation

## 2011-07-18 NOTE — Assessment & Plan Note (Signed)
Alyssa [name] is patient of Dr. Pamelia Hoit, who was seen for a long history of chronic low back pain status post multiple surgeries and fusion in 2004, by Dr. Jordan Likes.  She also has some knee pain and cervicalgia, but her main complaints are back pain.  She did ask me about her right ankle today, swelling and being painful from time to time, but she declined any kind of investigative or diagnostic treatment.  She rates her pain on average 4-5, sharp burning pain. Activity level is 2-5. Sleep patterns are poor.  Pain is worse in the evening; walking and standing tend to aggravate, heat and medication tend to help.  She walks without assistance.  She walk about 5 minutes. She does not climb steps.  She drives.  She is unemployed.  She is a housewife.  REVIEW OF SYSTEMS:  Notable for those difficulties as described above as well as some night sweats, constipation, paresthesias, spasms, depression, anxiety, no suicidal thoughts, or aberrant behaviors.  PAST MEDICAL HISTORY:  Unchanged.  SOCIAL HISTORY:  Married, lives with her family.  FAMILY HISTORY:  Unchanged.  PHYSICAL EXAMINATION:  VITAL SIGNS:  Her blood pressure is 131/70, pulse 80, respirations 19, O2 sats 96 on room air. MUSCULOSKELETAL:  Motor strength is good in lower extremities. Sensation is intact.  Constitutionally, within normal limits.  She appears depressed and very tired as well as having complaints of having to wait in the past, she was glad she did not wait so long today.  Her gait is normal.  She was little slow to rise from the seated position.  IMPRESSION: 1. Chronic low back pain status post surgeries. 2. Bilateral knee pain, osteoarthritis. 3. Right ankle fracture with lingering pain. 4. Cervicalgia. 5. Neuropathic pain.  PLAN:  Refilled morphine IR 50 mg one p.o. t.i.d. #90 with no refill. She has already got an appointment to follow up with Dr. Pamelia Hoit in 1 month.  Her Oswestry score is 50.  Her  questions were encouraged and answered.     Shevon Sian L. Blima Dessert Electronically Signed    RLW/MedQ D:  07/17/2011 13:50:23  T:  07/18/2011 02:43:18  Job #:  [id number]

## 2011-08-15 ENCOUNTER — Encounter: Payer: Self-pay | Attending: Physical Medicine and Rehabilitation | Admitting: Physical Medicine and Rehabilitation

## 2011-08-15 DIAGNOSIS — M25579 Pain in unspecified ankle and joints of unspecified foot: Secondary | ICD-10-CM | POA: Insufficient documentation

## 2011-08-15 DIAGNOSIS — Z981 Arthrodesis status: Secondary | ICD-10-CM | POA: Insufficient documentation

## 2011-08-15 DIAGNOSIS — M545 Low back pain, unspecified: Secondary | ICD-10-CM | POA: Insufficient documentation

## 2011-08-15 DIAGNOSIS — M79609 Pain in unspecified limb: Secondary | ICD-10-CM

## 2011-08-15 DIAGNOSIS — M76899 Other specified enthesopathies of unspecified lower limb, excluding foot: Secondary | ICD-10-CM | POA: Insufficient documentation

## 2011-08-15 DIAGNOSIS — M1710 Unilateral primary osteoarthritis, unspecified knee: Secondary | ICD-10-CM | POA: Insufficient documentation

## 2011-08-15 DIAGNOSIS — G5600 Carpal tunnel syndrome, unspecified upper limb: Secondary | ICD-10-CM | POA: Insufficient documentation

## 2011-08-15 DIAGNOSIS — M542 Cervicalgia: Secondary | ICD-10-CM | POA: Insufficient documentation

## 2011-08-15 DIAGNOSIS — G8929 Other chronic pain: Secondary | ICD-10-CM | POA: Insufficient documentation

## 2011-08-15 DIAGNOSIS — Z79899 Other long term (current) drug therapy: Secondary | ICD-10-CM | POA: Insufficient documentation

## 2011-08-16 NOTE — Assessment & Plan Note (Signed)
Alyssa [name] is a pleasant 55 year old married woman who is a patient of Dr. Carolyne Fiscal from [facility].  Alyssa [name] was last seen by me on May 21, 2011.  Alyssa [name] has a long history of chronic low back pain beginning at age 20.  Alyssa [name] has had multiple spinal surgeries.  Her last surgery was fusion from L3 to the sacrum by Dr. Jordan Likes in May 2003.  Alyssa [name] also has chronic knee pain secondary to osteoarthritis as well as cervicalgia intermittently.  Alyssa [name] is back in today and reports average pain with meds about 4 on a scale of 10.  Pain is described as burning, tingling, aching, predominately parascapular, mid-back, low back, knees.  Pain is typically worse toward the evening, worse with activities, improves with rest, heat, medication.  Alyssa [name] reports good relief with current meds.  [facility] through [facility] include morphine 15 mg three times a day.  FUNCTIONAL STATUS:  Alyssa [name] can walk about 5 minutes at a time.  Alyssa [name] is able to drive.  Alyssa [name] is independent with self care, needs assistance with heavier household tasks.  Alyssa [name] also helps take care of her mother and sister.  REVIEW OF SYSTEMS:  Alyssa [name] reports occasional bladder problems, weakness, numbness, tingling, spasms, depression, anxiety.  Denies suicidal ideation.  Alyssa [name] also reports occasional constipation, abdominal pain, limb swelling, coughing.  I asked her to follow with primary care physician.  I did have a discussion with her today regarding management of her constipation while on morphine.  I discussed the importance of using a stool softener and increasing fiber intake as well as fluids and occasionally using bowel stimulants.  No changes in past medical, social, or family history.  Alyssa [name] continues to smoke a pack cigarettes a day, caution against that.  PHYSICAL EXAMINATION:  VITAL SIGNS:  Blood pressure is 134/87, pulse is 69, respirations 16, 97% saturation on room air. GENERAL:  Well-developed mildly obese woman who does not  appear in any distress. NEUROLOGIC:  Alyssa [name] is oriented x3.  Speech is clear.  Affect is bright. Alyssa [name] is alert, cooperative and pleasant.  Follows commands without difficulty, answers questions appropriately.  Cranial nerves, coordinations are intact.  Reflexes are 2+ in the biceps, triceps, brachioradialis, 2+ patellar and Achilles tendons.  Toes are downgoing. No clonus, tremors are noted or abnormal tone noted.  Manual muscle testing reveals good strength in both upper and lower extremities. Decreased sensation to light touch in the lower extremities is appreciated.  Transitions from sitting to standing is without difficulty.  Gait is stable, tandem gait.  Romberg tests are all performed adequately.  Mild limitations in lumbar motion are appreciated. Right ankle no point tenderness, warm, no erythema.  IMPRESSION: 1. Lumbago, status post 3 spinal surgeries outlined in previous notes,     last surgery was done in May 2004, L3-S1 fusion with posterior     pedicle screws. 2. Bilateral knee osteoarthritis. 3. Chronic ankle pain status post remote fracture. 4. Cervicalgia intermittently. 5. Left lower extremity pain, most likely neuropathic in nature with     cramping in the left lower extremity at times.  Currently not a     problem. 6. History of carpal tunnel syndrome. 7. Bilateral trochanteric bursitis, worse on left than on right,     stable.  PLAN:  The patient is complaining a little more of the right ankle pain. I have examined it.  Alyssa [name] is not interested in any workup at this time for this ankle.  We will refill her  pain medication, morphine immediate release 15 mg one p.o. t.i.d. #90.  Last urine drug screen was done April 25, 2011, and was consistent.  Her pill counts are appropriate. Alyssa [name] takes her medications as prescribed without any aberrant behavior. We will continue to monitor her pain medication usage.  We will have nurse practitioner see over the next couple of months.   I will see her back in 3 months.  I have answered all her questions.  Alyssa [name] is comfortable with our plan at this time.  Should her right ankle to be more problematic for her, I have suggested we consider radiographs.  Alyssa [name] will think about it in the upcoming visit.     Brantley Stage, M.D. Electronically Signed    DMK/MedQ D:  08/15/2011 14:41:02  T:  08/16/2011 00:41:03  Job #:  [id number]

## 2011-09-12 ENCOUNTER — Encounter: Payer: Self-pay | Attending: Neurosurgery | Admitting: Neurosurgery

## 2011-09-12 DIAGNOSIS — M545 Low back pain, unspecified: Secondary | ICD-10-CM | POA: Insufficient documentation

## 2011-09-12 DIAGNOSIS — R252 Cramp and spasm: Secondary | ICD-10-CM | POA: Insufficient documentation

## 2011-09-12 DIAGNOSIS — M1710 Unilateral primary osteoarthritis, unspecified knee: Secondary | ICD-10-CM

## 2011-09-12 DIAGNOSIS — M25579 Pain in unspecified ankle and joints of unspecified foot: Secondary | ICD-10-CM | POA: Insufficient documentation

## 2011-09-12 DIAGNOSIS — G8929 Other chronic pain: Secondary | ICD-10-CM | POA: Insufficient documentation

## 2011-09-12 DIAGNOSIS — M79609 Pain in unspecified limb: Secondary | ICD-10-CM | POA: Insufficient documentation

## 2011-09-12 DIAGNOSIS — M542 Cervicalgia: Secondary | ICD-10-CM

## 2011-09-12 NOTE — Assessment & Plan Note (Signed)
This is a patient of Dr. Leretha Dykes, is seen for a long history of chronic low back pain.  She has also had some ankle pain and leg pain. The patient tells me she has been having a lot of cramps in her lower extremities.  Dr. Pamelia Hoit recommended at her last appointment that we get some x-rays to look at the ankles and she has again declined due to cost.  She reports her pain is unchanged, basically rating at 4-5, it is a sharp, burning, aching type pain.  General activity level is 2-5. Sleep patterns are poor.  Pain is worse with walking and standing. Rest, heat, and medication tend to help.  She walks without assistance. She can walk about 5 minutes at a time.  She does not climb steps.  She does not drive.  She works as a housewife and caregiver.  She needs some help with household duties.  REVIEW OF SYSTEMS:  Notable for difficulties as described above as well as some night sweats, constipation, some paresthesias, spasms, depression, and anxiety.  There are no suicidal thoughts or aberrant behaviors.  Her Oswestry score is 50.  PAST MEDICAL HISTORY:  Unchanged.  SOCIAL HISTORY:  She is married, lives with her family.  FAMILY HISTORY:  Unchanged.  PHYSICAL EXAMINATION:  VITAL SIGNS:  Her blood pressure is 137/78, pulse 83, respirations 16, O2 sats 94 on room air. MUSCULOSKELETAL:  Her motor strength is normal and within normal limits in her upper and lower extremities.  Sensation is intact. NEUROLOGIC:  Constitutionally, she is within normal limits.  She is alert and oriented x3.  ASSESSMENT: 1. Lumbago. 2. Bilateral knee osteoarthritis. 3. Chronic ankle pain. 4. Cervicalgia. 5. Generalized lower extremity pain.  PLAN: 1. Refill morphine sulfate IR 15 one p.o. t.i.d. p.r.n., 90 with no     refill. 2. We are going to try on Flexeril 10 mg 1 p.o. b.i.d., 60 with no     refill. 3. I did talk to her about a non-prescription remedy for her cramps,     told her she could  use yellow mustard sublingually when her cramps     are worst, it tends to work, I am not sure of the scientific     modalities behind it, but I know from personal experience it does     work and she said she would try this.  We will see her back here in     the clinic in 1 month.  Questions were encouraged and answered.     Wilfred Dayrit L. Blima Dessert Electronically Signed    RLW/MedQ D:  09/12/2011 14:10:40  T:  09/12/2011 21:31:08  Job #:  [id number]

## 2011-10-10 ENCOUNTER — Encounter: Payer: Self-pay | Attending: Neurosurgery | Admitting: Neurosurgery

## 2011-10-10 DIAGNOSIS — M1710 Unilateral primary osteoarthritis, unspecified knee: Secondary | ICD-10-CM

## 2011-10-10 DIAGNOSIS — M76899 Other specified enthesopathies of unspecified lower limb, excluding foot: Secondary | ICD-10-CM

## 2011-10-10 DIAGNOSIS — M545 Low back pain, unspecified: Secondary | ICD-10-CM | POA: Insufficient documentation

## 2011-10-10 DIAGNOSIS — M542 Cervicalgia: Secondary | ICD-10-CM

## 2011-10-10 DIAGNOSIS — G8929 Other chronic pain: Secondary | ICD-10-CM | POA: Insufficient documentation

## 2011-10-10 DIAGNOSIS — M79609 Pain in unspecified limb: Secondary | ICD-10-CM | POA: Insufficient documentation

## 2011-10-10 NOTE — Assessment & Plan Note (Signed)
This patient of Dr. Pamelia Hoit is seen for history of chronic low back pain.  She also has history of low back surgery with Dr. Jordan Likes.  She rates her pain today, unchanged at about a 4-5.  It is a sharp burning, aching type pain.  General activity level is 2-5.  Sleep patterns are poor.  Pain is worse walking and standing.  Rest, heat, and medication tend to help.  She walks without assistance.  She can walk about 5 minutes at a time.  She does not climb steps.  She can drive.  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  REVIEW OF SYSTEMS:  Notable for the difficulties described above as well as constipation, night sweats, tingling, numbness, spasm, depression, anxiety.  No suicidal thoughts or aberrant behaviors.  Oswestry score is 52.  Last UDS was consistent and she is due for 1 now.  Pill count is correct.  PAST MEDICAL HISTORY/SOCIAL HISTORY/FAMILY HISTORY:  Unchanged.  PHYSICAL EXAMINATION:  Blood pressure 110/58, pulse 57 respirations 14, O2 sats 94 on room air.  She has normal reflexes and sensation.  Her strength appears to be intact in the lower extremities. Constitutionally within normal limits.  She is alert and oriented x3. She has a normal gait.  ASSESSMENT: 1. Lumbago. 2. Bilateral knee osteoarthritis. 3. Cervicalgia. 4. Neuropathic lower extremity pain. 5. History of bilateral trochanter bursitis.  PLAN:  Refill morphine sulfate 15 mg IR 1 p.o. t.i.d. p.r.n. #90 with no refill, Flexeril 10 mg 1 p.o. b.i.d. p.r.n., #60 with 3 refills. Questions were encouraged and answered.  She will see Dr. Pamelia Hoit next month.     Cyndal Kasson L. Blima Dessert Electronically Signed    RLW/MedQ D:  10/10/2011 14:17:31  T:  10/10/2011 21:23:40  Job #:  [id number]

## 2011-11-12 ENCOUNTER — Encounter: Payer: Self-pay | Attending: Physical Medicine and Rehabilitation | Admitting: Physical Medicine and Rehabilitation

## 2011-11-12 DIAGNOSIS — M545 Low back pain, unspecified: Secondary | ICD-10-CM

## 2011-11-12 DIAGNOSIS — M1710 Unilateral primary osteoarthritis, unspecified knee: Secondary | ICD-10-CM | POA: Insufficient documentation

## 2011-11-12 DIAGNOSIS — IMO0002 Reserved for concepts with insufficient information to code with codable children: Secondary | ICD-10-CM | POA: Insufficient documentation

## 2011-11-12 DIAGNOSIS — M79609 Pain in unspecified limb: Secondary | ICD-10-CM | POA: Insufficient documentation

## 2011-11-12 DIAGNOSIS — M76899 Other specified enthesopathies of unspecified lower limb, excluding foot: Secondary | ICD-10-CM | POA: Insufficient documentation

## 2011-11-12 DIAGNOSIS — G8929 Other chronic pain: Secondary | ICD-10-CM | POA: Insufficient documentation

## 2011-11-12 DIAGNOSIS — M542 Cervicalgia: Secondary | ICD-10-CM | POA: Insufficient documentation

## 2011-11-12 DIAGNOSIS — X58XXXS Exposure to other specified factors, sequela: Secondary | ICD-10-CM | POA: Insufficient documentation

## 2011-11-12 DIAGNOSIS — M25579 Pain in unspecified ankle and joints of unspecified foot: Secondary | ICD-10-CM | POA: Insufficient documentation

## 2011-11-12 DIAGNOSIS — Z981 Arthrodesis status: Secondary | ICD-10-CM | POA: Insufficient documentation

## 2011-11-12 NOTE — Assessment & Plan Note (Signed)
Alyssa [name] is a 55 year old woman who is seen here at our [facility] for multiple chronic pain complaints. She has a long history of low back pain status post L3-S1 fusion back in May 2004.  She has bilateral knee osteoarthritis, chronic ankle pain status post remote fracture, intermittent cervicalgia, history of carpal tunnel syndrome in the past and bilateral trochanteric bursitis.  Her average pain is between a 4 and 5 with medications.  She describes her pain is sharp, burning, aching in nature.  Pain interferes with her activity moderately.  Sleep tendency poor.  Pain is worse with walking and standing and improves with rest, heat, medication.  She reports complete relief with her current meds.  FUNCTIONAL STATUS:  She can walk 5 minutes at a time.  She is able to drive.  She is independent with self-care.  She helps take care of her mother and does grocery shopping for her sister.  She reports a new problem with possible prolapse of her bladder.  She is going to see a Careers adviser in a couple of days.  She has an appointment on Wednesday.  She also reports intermittent numbness, tingling, spasms, depression, anxiety.  Denies suicidal ideation.  REVIEW OF SYSTEMS:  Otherwise noncontributory.  PAST MEDICAL/ SOCIAL/FAMILY HISTORY:  Unchanged.  PHYSICAL EXAMINATION:  VITAL SIGNS:  Blood pressure is 133/75, pulse 54, respiration 18, 93% saturated on room air.  She is 191 pounds and is 5 feet 5 inch. NEUROLOGIC:  She is well-developed obese woman who does not appear in any distress.  She is oriented x3.  Speech is clear.  Affect is bright. She is alert, cooperative, and pleasant.  Follows commands without difficulty.  Answers my questions appropriately.  Cranial nerves, coordination are grossly intact.  Her reflexes are diminished in upper and lower extremities.  No abnormal tone, clonus, or tremors are noted. Motor strength is good in both lower  extremities without focal deficit. No new sensory changes are apparent.  She has limitations in lumbar motion in all planes.  She transitions from sitting to standing.  Her gait is slow, nonantalgic.  Lumbar motion is limited in all planes.  IMPRESSION: 1. Lumbago status post 3 spinal surgeries outlined in previous notes.     Last surgery was done May 2004, L3-S1 fusion with pedicle screws. 2. Bilateral knee osteoarthritis.\ 3. Chronic ankle pain status post remote fracture. 4. Intermittent cervicalgia. 5. Intermittent neuropathic lower extremity pain. 6. History of carpal tunnel syndrome. 7. Bilateral trochanteric bursitis, worse on left than on the right.  PLAN:  Ms. Sinning has limited financial resources.  At this time, she prefers just to have her pain medication refilled, morphine sulfate IR 15 mg 1 p.o. t.i.d. #90.  She has concerns about possible upcoming surgery for her possible bladder prolapse.  I have asked her to make sure she has her surgeon contact our clinic if he intends to prescribe narcotics.  We understand that he may be managing her postop as well. Her last urine drug screen was October 10, 2011 and it was consistent.  I have answered all her questions.  She is comfortable with treatment plan.  She will see nurse practitioner next month and I will see her back in January.     Brantley Stage, M.D. Electronically Signed    DMK/MedQ D:  11/12/2011 13:28:52  T:  11/12/2011 19:36:00  Job #:  [id number]

## 2011-12-11 ENCOUNTER — Encounter: Payer: Self-pay | Attending: Neurosurgery | Admitting: Neurosurgery

## 2011-12-11 DIAGNOSIS — M1710 Unilateral primary osteoarthritis, unspecified knee: Secondary | ICD-10-CM | POA: Insufficient documentation

## 2011-12-11 DIAGNOSIS — M25579 Pain in unspecified ankle and joints of unspecified foot: Secondary | ICD-10-CM | POA: Insufficient documentation

## 2011-12-11 DIAGNOSIS — M545 Low back pain, unspecified: Secondary | ICD-10-CM | POA: Insufficient documentation

## 2011-12-11 DIAGNOSIS — G8929 Other chronic pain: Secondary | ICD-10-CM | POA: Insufficient documentation

## 2011-12-11 DIAGNOSIS — M542 Cervicalgia: Secondary | ICD-10-CM | POA: Insufficient documentation

## 2011-12-11 DIAGNOSIS — G894 Chronic pain syndrome: Secondary | ICD-10-CM

## 2011-12-12 NOTE — Assessment & Plan Note (Signed)
This is a patient of Dr. Pamelia Hoit who has seen for low back, lower extremity pain as well as neck pain.  She reports no change in her pain. It is at a 4.  It is a sharp, burning, aching type pain.  General activity level is 2-5.  Sleep patterns are poor.  Pain is worse in the evening.  Walking and standing aggravate.  Rest, heat and medication help.  She walks without assistance.  She walk about 5 minutes at a time.  She does not climb steps.  She does drive.  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  REVIEW OF SYSTEMS:  Notable for difficulties described above as well as some weight gain, night sweats, constipation, paresthesias, spasms, depression, anxiety.  No suicidal thoughts or aberrant behaviors.  Last pill count and UDS consistent.  PAST MEDICAL HISTORY, SOCIAL HISTORY AND FAMILY HISTORY:  Unchanged.  PHYSICAL EXAMINATION:  Blood pressure is 122/75, pulse 90, respirations 20, O2 sats 98 on room air.  Motor strength and sensation are intact. Constitutionally, she is within normal limits.  She is alert and oriented x3.  She has a normal gait.  ASSESSMENT: 1. Lumbago. 2. Bilateral knee osteoarthritis. 3. Chronic right ankle pain. 4. Cervicalgia.  PLAN:  Refill morphine sulfate 15 mg IR 1 p.o. t.i.d. p.r.n., 90 with no refills.  Questions were encouraged and answered.  I will see her back in a month.     Alyssa [name] Electronically Signed    RLW/MedQ D:  12/11/2011 13:32:10  T:  12/12/2011 02:24:49  Job #:  [id number]

## 2012-01-09 ENCOUNTER — Encounter: Payer: Self-pay | Attending: Neurosurgery | Admitting: Neurosurgery

## 2012-01-09 DIAGNOSIS — M545 Low back pain, unspecified: Secondary | ICD-10-CM | POA: Insufficient documentation

## 2012-01-09 DIAGNOSIS — E669 Obesity, unspecified: Secondary | ICD-10-CM | POA: Insufficient documentation

## 2012-01-09 DIAGNOSIS — M542 Cervicalgia: Secondary | ICD-10-CM

## 2012-01-09 DIAGNOSIS — G8929 Other chronic pain: Secondary | ICD-10-CM | POA: Insufficient documentation

## 2012-01-09 DIAGNOSIS — M1710 Unilateral primary osteoarthritis, unspecified knee: Secondary | ICD-10-CM

## 2012-01-09 DIAGNOSIS — M25579 Pain in unspecified ankle and joints of unspecified foot: Secondary | ICD-10-CM | POA: Insufficient documentation

## 2012-01-09 DIAGNOSIS — M79609 Pain in unspecified limb: Secondary | ICD-10-CM | POA: Insufficient documentation

## 2012-01-10 NOTE — Assessment & Plan Note (Signed)
This is a patient of Dr. Leretha Dykes seen for low back and lower extremity pain.  She reports no change in her pain at a 4.  It is a sharp, burning, and aching-type pain.  General activity level is 2-5. Pain is worse in the evening.  Sleep patterns are poor.  Walking and standing aggravate.  Rest, heat, and medication help.  She walks without assistance.  She can climb steps.  She does not drive.  She works as a Museum/gallery exhibitions officer.  She needs help with household duties and shopping.  REVIEW OF SYSTEMS:  Notable for difficulties as described above as well as some night sweats, weight gain, paresthesias, spasms, depression, and anxiety.  No suicidal thoughts or aberrant behaviors.  Pill count and UDS is consistent.  PAST MEDICAL HISTORY/SOCIAL HISTORY/FAMILY HISTORY:  Unchanged.  PHYSICAL EXAMINATION:  VITAL SIGNS:  Blood pressure is 122/68, pulse 84, respirations 14, and O2 sat is 97 on room air. NEUROLOGIC:  Motor strength and sensation are intact. CONSTITUTIONAL:  She is mildly obese.  She is alert and oriented x3. She has normal gait.  ASSESSMENT: 1. Lumbago. 2. Bilateral knee osteoarthritis. 3. Chronic right ankle pain. 4. Cervicalgia.  PLAN:  Refill morphine sulfate 15 mg 1 p.o. t.i.d., #90 with no refill. Questions were encouraged and answered.  She will be back in a month.     Jann Milkovich L. Blima Dessert Electronically Signed    RLW/MedQ D:  01/09/2012 14:34:19  T:  01/10/2012 05:16:41  Job #:  [id number]

## 2012-02-08 ENCOUNTER — Encounter: Payer: Self-pay | Attending: Physical Medicine and Rehabilitation | Admitting: Physical Medicine and Rehabilitation

## 2012-02-08 DIAGNOSIS — M545 Low back pain, unspecified: Secondary | ICD-10-CM

## 2012-02-08 DIAGNOSIS — G894 Chronic pain syndrome: Secondary | ICD-10-CM

## 2012-02-08 DIAGNOSIS — M79609 Pain in unspecified limb: Secondary | ICD-10-CM

## 2012-02-08 DIAGNOSIS — M1710 Unilateral primary osteoarthritis, unspecified knee: Secondary | ICD-10-CM | POA: Insufficient documentation

## 2012-02-08 DIAGNOSIS — G5600 Carpal tunnel syndrome, unspecified upper limb: Secondary | ICD-10-CM | POA: Insufficient documentation

## 2012-02-08 DIAGNOSIS — M25579 Pain in unspecified ankle and joints of unspecified foot: Secondary | ICD-10-CM | POA: Insufficient documentation

## 2012-02-08 DIAGNOSIS — Z8781 Personal history of (healed) traumatic fracture: Secondary | ICD-10-CM | POA: Insufficient documentation

## 2012-02-08 DIAGNOSIS — F17200 Nicotine dependence, unspecified, uncomplicated: Secondary | ICD-10-CM | POA: Insufficient documentation

## 2012-02-08 DIAGNOSIS — M542 Cervicalgia: Secondary | ICD-10-CM | POA: Insufficient documentation

## 2012-02-08 DIAGNOSIS — M76899 Other specified enthesopathies of unspecified lower limb, excluding foot: Secondary | ICD-10-CM | POA: Insufficient documentation

## 2012-02-08 DIAGNOSIS — G8929 Other chronic pain: Secondary | ICD-10-CM | POA: Insufficient documentation

## 2012-02-08 DIAGNOSIS — M25569 Pain in unspecified knee: Secondary | ICD-10-CM | POA: Insufficient documentation

## 2012-02-08 DIAGNOSIS — Z981 Arthrodesis status: Secondary | ICD-10-CM | POA: Insufficient documentation

## 2012-02-09 NOTE — Assessment & Plan Note (Signed)
HISTORY OF PRESENT ILLNESS:  [name] is a pleasant 56 year old woman who is followed [facility] at [facility].  She is back in today for refill of her pain medications.  She is seen here for chronic pain complaints predominantly related to her knees and her low back.  She was last seen by a nurse practitioner last month.  Her average pain is about 4 on a scale of 10 with medication.  She reports essentially complete relief with the current medications she uses.  Pain interferes moderately with activity levels.  Sleep is affected. Pain is worse with walking and standing.  Improves with heat and rest as well as medications.  Pain is described as sharp, burning, and aching in nature.  FUNCTIONAL STATUS:  She can walk about 5 minutes.  She has difficulty with stairs, especially going down.  She is able to drive.  She is independent with self-care.  REVIEW OF SYSTEMS:  Negative for problems with bowel or bladder.  Denies suicidal ideation.  Admits to depression, anxiety, spasms with numbness and tingling.  Other problems are noncontributory.  I asked her to follow up with her primary care for these problems.  PAST MEDICAL HISTORY:  Unchanged.  She continues to smoke.  I cautioned her against this.  FAMILY HISTORY:  Unchanged as well.  MEDICATIONS PRESCRIB[facility]OUGH [facility]:  Morphine sulfate immediate release 15 mg 3 times a day and p.r.n. Flexeril.  PHYSICAL EXAMINATION:  VITAL SIGNS:  Blood pressure is 111/67, pulse 73, respirations 16, 98% saturated on room air, weight is 193 pounds and is 64 inches tall. GENERAL:  She is a well-developed, obese woman who does not appear in any distress.  She is oriented x3. NEUROLOGIC:  Speech is clear.  Affect is bright.  She is alert, cooperative, and pleasant.  Follows commands without difficulty. Answers my questions appropriately.  Cranial nerves and coordination are intact.  Reflexes  are diminished in the lower extremities without abnormal tone, clonus, or tremors.  Motor strength is 5/5 at hip flexors, knee extensors, knee flexors, dorsiflexors, and plantar flexors.  She reports numbness in both feet.  She is able to transition from sitting to standing easily.  Her gait is normal, non-antalgic.  Tandem gait and Romberg test are performed adequately.  Limitations are noted in lumbar motion in all planes. She has crepitus, especially in the left knee and some mild lateral joint line tenderness is noted as well.  She has some tenderness along the trochanter on the right.  IMPRESSION: 1. Bilateral knee osteoarthritis. 2. Lumbago, status post 3 lumbar surgeries.  Last surgery was done in     May 2004, fusion of L3 to the sacrum with posterior pedicle screws. 3. Right trochanteric bursitis. 4. Chronic ankle pain status post remote fracture. 5. Intermittent cervicalgia. 6. Left lower extremity pain, most likely neuropathic in nature with     intermittent cramping of the lower extremities at times. 7. History of carpal tunnel syndrome.  PLAN:  The patient is not interested in radiographs at this time or any type of injection or physical therapy to help manage her pain.  She is requesting a refill of her morphine.  Her last urine drug screen was done on October 10, 2011 and was consistent.  Her pill counts are appropriate.  She takes her medications as prescribed.  No evidence of aberrant behavior has been observed.  I have answered all her questions. She is comfortable with  the plan.     Brantley Stage, M.D.    DMK/MedQ D:  02/08/2012 14:06:15  T:  02/09/2012 06:58:10  Job #:  [id number]

## 2012-03-04 ENCOUNTER — Encounter: Payer: Self-pay | Admitting: *Deleted

## 2012-03-04 ENCOUNTER — Ambulatory Visit: Payer: Self-pay | Admitting: Neurosurgery

## 2012-03-04 ENCOUNTER — Encounter: Payer: Self-pay | Attending: Neurosurgery | Admitting: *Deleted

## 2012-03-04 VITALS — BP 127/67 | HR 67 | Resp 18 | Ht 64.0 in | Wt 192.0 lb

## 2012-03-04 DIAGNOSIS — M545 Low back pain, unspecified: Secondary | ICD-10-CM | POA: Insufficient documentation

## 2012-03-04 DIAGNOSIS — M542 Cervicalgia: Secondary | ICD-10-CM | POA: Insufficient documentation

## 2012-03-04 DIAGNOSIS — G8929 Other chronic pain: Secondary | ICD-10-CM | POA: Insufficient documentation

## 2012-03-04 DIAGNOSIS — F17200 Nicotine dependence, unspecified, uncomplicated: Secondary | ICD-10-CM | POA: Insufficient documentation

## 2012-03-04 DIAGNOSIS — M25569 Pain in unspecified knee: Secondary | ICD-10-CM | POA: Insufficient documentation

## 2012-03-04 DIAGNOSIS — M76899 Other specified enthesopathies of unspecified lower limb, excluding foot: Secondary | ICD-10-CM | POA: Insufficient documentation

## 2012-03-04 DIAGNOSIS — G8921 Chronic pain due to trauma: Secondary | ICD-10-CM | POA: Insufficient documentation

## 2012-03-04 DIAGNOSIS — Z981 Arthrodesis status: Secondary | ICD-10-CM | POA: Insufficient documentation

## 2012-03-04 DIAGNOSIS — G5600 Carpal tunnel syndrome, unspecified upper limb: Secondary | ICD-10-CM | POA: Insufficient documentation

## 2012-03-04 DIAGNOSIS — M79609 Pain in unspecified limb: Secondary | ICD-10-CM | POA: Insufficient documentation

## 2012-03-04 DIAGNOSIS — M1710 Unilateral primary osteoarthritis, unspecified knee: Secondary | ICD-10-CM | POA: Insufficient documentation

## 2012-03-04 MED ORDER — MORPHINE SULFATE 15 MG PO TABS: 15.0000 mg | ORAL_TABLET | Freq: Three times a day (TID) | ORAL | Status: DC | PRN

## 2012-03-04 NOTE — Progress Notes (Signed)
Had fever, chills, vomiting, diarrhea over weekend. Better now although fatigued. Recent rt wrist tendon injury, occurred while removing laundry from machine. Opioid Informed Consent read and signed today.

## 2012-04-01 ENCOUNTER — Encounter: Payer: Self-pay | Admitting: Physical Medicine and Rehabilitation

## 2012-04-09 ENCOUNTER — Ambulatory Visit: Payer: Self-pay | Admitting: Physical Medicine and Rehabilitation

## 2012-04-14 ENCOUNTER — Encounter: Payer: Self-pay | Admitting: Physical Medicine and Rehabilitation

## 2012-04-14 ENCOUNTER — Encounter: Payer: Self-pay | Attending: Neurosurgery | Admitting: *Deleted

## 2012-04-14 VITALS — BP 135/83 | HR 68 | Resp 16 | Ht 65.0 in | Wt 192.0 lb

## 2012-04-14 DIAGNOSIS — M79609 Pain in unspecified limb: Secondary | ICD-10-CM | POA: Insufficient documentation

## 2012-04-14 DIAGNOSIS — M25579 Pain in unspecified ankle and joints of unspecified foot: Secondary | ICD-10-CM | POA: Insufficient documentation

## 2012-04-14 DIAGNOSIS — E669 Obesity, unspecified: Secondary | ICD-10-CM | POA: Insufficient documentation

## 2012-04-14 DIAGNOSIS — G8929 Other chronic pain: Secondary | ICD-10-CM | POA: Insufficient documentation

## 2012-04-14 DIAGNOSIS — M545 Low back pain, unspecified: Secondary | ICD-10-CM | POA: Insufficient documentation

## 2012-04-14 DIAGNOSIS — M542 Cervicalgia: Secondary | ICD-10-CM | POA: Insufficient documentation

## 2012-04-14 DIAGNOSIS — M1710 Unilateral primary osteoarthritis, unspecified knee: Secondary | ICD-10-CM | POA: Insufficient documentation

## 2012-04-14 MED ORDER — MORPHINE SULFATE 15 MG PO TABS: 15.0000 mg | ORAL_TABLET | Freq: Three times a day (TID) | ORAL | Status: DC | PRN

## 2012-04-14 NOTE — Progress Notes (Signed)
Pt states pain medication is still giving her relief, no changes. Advised to keep medication in a safe place.

## 2012-04-14 NOTE — Progress Notes (Unsigned)
  Subjective:    Patient ID: Alyssa [name], female    DOB: 01/21/1956, 56 y.o.   MRN: [id number]  HPI  Pain Inventory Average Pain 4 Pain Right Now 4 My pain is sharp, burning and aching  In the last 24 hours, has pain interfered with the following? General activity 5 Relation with others 5 Enjoyment of life 2 What TIME of day is your pain at its worst? evening Sleep (in general) Poor  Pain is worse with: walking and standing Pain improves with: rest, heat/ice and medication Relief from Meds: 10  Mobility walk without assistance how many minutes can you walk? 5 ability to climb steps?  no do you drive?  yes Do you have any goals in this area?  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  no  Physicians involved in your care Any changes since last visit?  no      Review of Systems  Constitutional: Positive for diaphoresis and unexpected weight change.       Night sweats and weight gain  Gastrointestinal: Positive for constipation.  Musculoskeletal: Positive for back pain and arthralgias.       Spasms  Neurological: Positive for numbness.  Psychiatric/Behavioral: Positive for dysphoric mood. The patient is nervous/anxious.   All other systems reviewed and are negative.       Objective:   Physical Exam        Assessment & Plan:

## 2012-04-18 ENCOUNTER — Encounter: Payer: Self-pay | Admitting: Physical Medicine and Rehabilitation

## 2012-05-12 ENCOUNTER — Encounter: Payer: Self-pay | Attending: Neurosurgery | Admitting: Physical Medicine and Rehabilitation

## 2012-05-12 ENCOUNTER — Encounter: Payer: Self-pay | Admitting: Physical Medicine and Rehabilitation

## 2012-05-12 VITALS — BP 102/63 | HR 68 | Resp 16 | Ht 65.0 in | Wt 195.0 lb

## 2012-05-12 DIAGNOSIS — M25571 Pain in right ankle and joints of right foot: Secondary | ICD-10-CM | POA: Insufficient documentation

## 2012-05-12 DIAGNOSIS — M79609 Pain in unspecified limb: Secondary | ICD-10-CM | POA: Insufficient documentation

## 2012-05-12 DIAGNOSIS — IMO0002 Reserved for concepts with insufficient information to code with codable children: Secondary | ICD-10-CM

## 2012-05-12 DIAGNOSIS — E669 Obesity, unspecified: Secondary | ICD-10-CM | POA: Insufficient documentation

## 2012-05-12 DIAGNOSIS — M25562 Pain in left knee: Secondary | ICD-10-CM | POA: Insufficient documentation

## 2012-05-12 DIAGNOSIS — M25569 Pain in unspecified knee: Secondary | ICD-10-CM

## 2012-05-12 DIAGNOSIS — M545 Low back pain, unspecified: Secondary | ICD-10-CM | POA: Insufficient documentation

## 2012-05-12 DIAGNOSIS — G8929 Other chronic pain: Secondary | ICD-10-CM | POA: Insufficient documentation

## 2012-05-12 DIAGNOSIS — M542 Cervicalgia: Secondary | ICD-10-CM | POA: Insufficient documentation

## 2012-05-12 DIAGNOSIS — M1710 Unilateral primary osteoarthritis, unspecified knee: Secondary | ICD-10-CM | POA: Insufficient documentation

## 2012-05-12 DIAGNOSIS — M25579 Pain in unspecified ankle and joints of unspecified foot: Secondary | ICD-10-CM | POA: Insufficient documentation

## 2012-05-12 MED ORDER — MORPHINE SULFATE 15 MG PO TABS: 15.0000 mg | ORAL_TABLET | Freq: Three times a day (TID) | ORAL | Status: DC | PRN

## 2012-05-12 NOTE — Progress Notes (Signed)
Subjective:    Patient ID: Alyssa [name], female    DOB: 1956/07/27, 56 y.o.   MRN: [id number]  HPI   Alyssa [name] is a 56 year old woman who is [facility] at [facility] for multiple chronic pain complaints.  She has a long history of low back pain status post L3-S1 fusion back in  May 2004. She has bilateral knee osteoarthritis, chronic ankle pain  status post remote fracture, intermittent cervicalgia, history of carpal  tunnel syndrome in the past and bilateral trochanteric bursitis.  Her average pain is between a 4 and 5 with medications. She describes  her pain is sharp, burning, aching in nature. Pain interferes with her  activity moderately. Sleep tendency poor. Pain is worse with walking  and standing and improves with rest, heat, medication. She reports  complete relief with her current meds.     Problems in the past with bladder prolapse.    FUNCTIONAL STATUS: She can walk 5 minutes at a time. She is able to  drive. She is independent with self-care. She helps take care of her  mother and does grocery shopping for her sister.   She also reports intermittent numbness, tingling, spasms,  depression, anxiety. Denies suicidal ideation.          Pain Inventory Average Pain 4 Pain Right Now 4 My pain is sharp, burning and aching  In the last 24 hours, has pain interfered with the following? General activity 5 Relation with others 5 Enjoyment of life 2 What TIME of day is your pain at its worst? evening Sleep (in general) Poor  Pain is worse with: walking and standing Pain improves with: rest and heat/ice Relief from Meds: 10  Mobility walk without assistance how many minutes can you walk? 5 min ability to climb steps?  no do you drive?  yes Do you have any goals in this area?  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  no  Physicians involved in your care Any changes since last visit?  no       Review of Systems  Constitutional: Positive for diaphoresis and unexpected weight change.  HENT: Negative.   Eyes: Negative.   Respiratory: Negative.   Cardiovascular: Negative.   Gastrointestinal: Positive for constipation.  Genitourinary: Negative.   Musculoskeletal: Negative.   Skin: Negative.   Neurological: Negative.   Hematological: Negative.   Psychiatric/Behavioral: Negative.        Objective:   Physical ExamCranial nerves,  coordination are grossly intact.   Her reflexes are diminished in upper  and lower extremities. No abnormal tone, clonus, or tremors are noted.   SLR negative bilat    Motor strength is good in both lower extremities without focal deficit.    No new sensory changes are apparent. She has limitations in lumbar  motion in all planes.    She transitions from sitting to standing. Her gait is slow,  nonantalgic.   Lumbar motion is limited in all planes.               Assessment & Plan:  1. Lumbago status post 3 spinal surgeries outlined in previous notes.  Last surgery was done May 2004, L3-S1 fusion with pedicle screws.  2. Bilateral knee osteoarthritis.\  3. Right Chronic ankle pain status post remote fracture.  4. Intermittent cervicalgia.  5. Intermittent neuropathic lower extremity pain.  6. History of carpal tunnel syndrome.  7. Bilateral trochanteric bursitis, worse on left than on the right.    PLAN: Ms. Start has limited financial resources. At this time, she  prefers just to have her pain medication refilled, morphine sulfate IR  15 mg 1 p.o. t.i.d. #90.  Her last urine drug screen was  October 10, 2011 and it was consistent. I have answered all her  questions. She is comfortable with treatment plan. She will see nurse  practitioner next two months, [place] in 3 months

## 2012-05-12 NOTE — Patient Instructions (Signed)
F/u with PA over next 2 months and Azoria Abbett in 3 months

## 2012-06-05 DIAGNOSIS — K589 Irritable bowel syndrome without diarrhea: Secondary | ICD-10-CM | POA: Insufficient documentation

## 2012-06-12 ENCOUNTER — Encounter: Payer: Self-pay | Attending: Physical Medicine and Rehabilitation | Admitting: Physical Medicine and Rehabilitation

## 2012-06-12 ENCOUNTER — Encounter: Payer: Self-pay | Admitting: Physical Medicine and Rehabilitation

## 2012-06-12 VITALS — BP 134/76 | HR 73 | Resp 14 | Ht 65.0 in | Wt 194.0 lb

## 2012-06-12 DIAGNOSIS — R209 Unspecified disturbances of skin sensation: Secondary | ICD-10-CM | POA: Insufficient documentation

## 2012-06-12 DIAGNOSIS — M25569 Pain in unspecified knee: Secondary | ICD-10-CM | POA: Insufficient documentation

## 2012-06-12 DIAGNOSIS — M545 Low back pain, unspecified: Secondary | ICD-10-CM | POA: Insufficient documentation

## 2012-06-12 DIAGNOSIS — M25561 Pain in right knee: Secondary | ICD-10-CM

## 2012-06-12 DIAGNOSIS — K5900 Constipation, unspecified: Secondary | ICD-10-CM | POA: Insufficient documentation

## 2012-06-12 DIAGNOSIS — G8929 Other chronic pain: Secondary | ICD-10-CM | POA: Insufficient documentation

## 2012-06-12 DIAGNOSIS — M25579 Pain in unspecified ankle and joints of unspecified foot: Secondary | ICD-10-CM | POA: Insufficient documentation

## 2012-06-12 MED ORDER — MORPHINE SULFATE 15 MG PO TABS: 15.0000 mg | ORAL_TABLET | Freq: Three times a day (TID) | ORAL | Status: DC | PRN

## 2012-06-12 NOTE — Patient Instructions (Signed)
Continue with medication, continue with applying ice, at least every other hour for 10 min.. Advised patient to apply anti inflammatory creme to her right knee.

## 2012-06-12 NOTE — Progress Notes (Signed)
Subjective:    Patient ID: Alyssa [name], female    DOB: June 10, 1956, 56 y.o.   MRN: [id number]  HPI The patient complains about chronic knee pain, mildly swollen right knee . The patient also complains about numbness and tingling in both feet. The problem has gotten better since the last 2 weeks.   Pain Inventory Average Pain 4 Pain Right Now 4 My pain is sharp, burning and aching  In the last 24 hours, has pain interfered with the following? General activity 5 Relation with others 5 Enjoyment of life 2 What TIME of day is your pain at its worst? evening Sleep (in general) Poor  Pain is worse with: walking and standing Pain improves with: rest and heat/ice Relief from Meds: 10  Mobility walk without assistance how many minutes can you walk? 5 ability to climb steps?  no do you drive?  yes Do you have any goals in this area?  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  no  Physicians involved in your care Any changes since last visit?  no   Family History  Problem Relation Age of Onset  . Stroke Mother   . Diabetes Mother   . Diabetes Father    History   Social History  . Marital Status: Married    Spouse Name: N/A    Number of Children: N/A  . Years of Education: N/A   Social History Main Topics  . Smoking status: Current Everyday Smoker -- 1.0 packs/day  . Smokeless tobacco: Never Used  . Alcohol Use: None  . Drug Use: None  . Sexually Active: None   Other Topics Concern  . None   Social History Narrative  . None   Past Surgical History  Procedure Date  . Abdominal hysterectomy    Past Medical History  Diagnosis Date  . Cervicalgia   . Pain in limb   . Lumbago   . Primary localized osteoarthrosis, lower leg   . Enthesopathy of hip region    BP  134/76  Pulse 73  Resp 14  Ht 5\' 5"  (1.651 m)  Wt 194 lb (87.998 kg)  BMI 32.28 kg/m2  SpO2 95%     Review of Systems  Constitutional: Positive for unexpected weight change.  Gastrointestinal: Positive for constipation.  Psychiatric/Behavioral: Positive for dysphoric mood.  All other systems reviewed and are negative.       Objective:   Physical Exam Symmetric normal motor tone is noted throughout. Normal muscle bulk. Muscle testing reveals 5/5 muscle strength of the upper extremity, and 5/5 of the lower extremity. Full range of motion in upper and lower extremities. ROM of spine is  restricted. Fine motor movements are normal in both hands. Sensory is intact and symmetric to light touch, pinprick and proprioception. DTR in the upper and lower extremity are present and symmetric 2+. No clonus is noted.  Patient arises from chair with mild difficulty.  Mild effusion of right knee, in lateral aspect, no redness or heat noted.     Assessment & Plan:  This is a 56 year old female with 1. Chronic low back pain 2. Bilateral chronic knee pain 3. Chronic ankle pain on the right  Plan : Continue this medication, advised patient to apply ice on her right knee for 10 minutes every hour ; Also to elevate  the knee and to wear a brace when on her feet. I wanted to prescribe Voltaren gel for her, but she said that would be too expensive. Suggested that she should get aspirin cream over-the-counter and apply this tid to her right knee. Patient was very depressed and exhausted today, she is taking care of her mother and of her disabled child, she also has her daughter and her grand daughter living in the home which creates a lot of conflict. Advised the patient to think about her own health, and somehow try to change the current situation. Medication was refilled today. Follow up in 1 month with Dr. Pamelia Hoit.

## 2012-07-07 ENCOUNTER — Encounter: Payer: Self-pay | Attending: Neurosurgery | Admitting: Physical Medicine and Rehabilitation

## 2012-07-07 ENCOUNTER — Encounter: Payer: Self-pay | Admitting: Physical Medicine and Rehabilitation

## 2012-07-07 VITALS — BP 114/68 | HR 71 | Resp 16 | Wt 195.4 lb

## 2012-07-07 DIAGNOSIS — G8929 Other chronic pain: Secondary | ICD-10-CM | POA: Insufficient documentation

## 2012-07-07 DIAGNOSIS — M79609 Pain in unspecified limb: Secondary | ICD-10-CM | POA: Insufficient documentation

## 2012-07-07 DIAGNOSIS — M542 Cervicalgia: Secondary | ICD-10-CM | POA: Insufficient documentation

## 2012-07-07 DIAGNOSIS — M545 Low back pain, unspecified: Secondary | ICD-10-CM | POA: Insufficient documentation

## 2012-07-07 DIAGNOSIS — IMO0002 Reserved for concepts with insufficient information to code with codable children: Secondary | ICD-10-CM

## 2012-07-07 DIAGNOSIS — E669 Obesity, unspecified: Secondary | ICD-10-CM | POA: Insufficient documentation

## 2012-07-07 DIAGNOSIS — M25569 Pain in unspecified knee: Secondary | ICD-10-CM

## 2012-07-07 DIAGNOSIS — M25579 Pain in unspecified ankle and joints of unspecified foot: Secondary | ICD-10-CM | POA: Insufficient documentation

## 2012-07-07 DIAGNOSIS — M1710 Unilateral primary osteoarthritis, unspecified knee: Secondary | ICD-10-CM | POA: Insufficient documentation

## 2012-07-07 MED ORDER — MORPHINE SULFATE 15 MG PO TABS: 15.0000 mg | ORAL_TABLET | Freq: Three times a day (TID) | ORAL | Status: DC | PRN

## 2012-07-07 NOTE — Progress Notes (Signed)
Subjective:    Patient ID: Alyssa [name], female    DOB: 31-May-1956, 56 y.o.   MRN: [id number]  HPI  Alyssa [name] is a 56 year old woman who is seen for multiple chronic pain complaints.  She has a long history of low back pain status post L3-S1 fusion back in  May 2004. She has bilateral knee osteoarthritis, chronic ankle pain  status post remote fracture, intermittent cervicalgia, history of carpal  tunnel syndrome in the past and bilateral trochanteric bursitis.  Her average pain is between a 4 and 5 with medications. She describes  her pain is sharp, burning, aching in nature. Pain interferes with her  activity moderately. Sleep tendency poor. Pain is worse with walking  and standing and improves with rest, heat, medication. She reports  complete relief with her current meds.  Problems in the past with bladder prolapse.   Occasional abdominal pain, needs to F/u with PCP     Pain Inventory Average Pain 4 Pain Right Now 4 My pain is intermittent, sharp, burning, tingling and aching  In the last 24 hours, has pain interfered with the following? General activity 5 Relation with others 5 Enjoyment of life 2 What TIME of day is your pain at its worst? evening Sleep (in general) Poor  Pain is worse with: walking and standing Pain improves with: rest, heat/ice and medication Relief from Meds: 10  Mobility walk without assistance how many minutes can you walk? 5 ability to climb steps?  no do you drive?  yes  Function I need assistance with the following:  household duties and shopping  Neuro/Psych numbness tingling spasms depression anxiety  Prior Studies Any changes since last visit?  no  Physicians involved in your care Any changes since last visit?  no   Family History  Problem Relation Age of Onset  . Stroke Mother   . Diabetes Mother   . Diabetes Father    History   Social History  . Marital Status: Married    Spouse Name: N/A    Number of  Children: N/A  . Years of Education: N/A   Social History Main Topics  . Smoking status: Current Everyday Smoker -- 1.0 packs/day  . Smokeless tobacco: Never Used  . Alcohol Use: None  . Drug Use: None  . Sexually Active: None   Other Topics Concern  . None   Social History Narrative  . None   Past Surgical History  Procedure Date  . Abdominal hysterectomy    Past Medical History  Diagnosis Date  . Cervicalgia   . Pain in limb   . Lumbago   . Primary localized osteoarthrosis, lower leg   . Enthesopathy of hip region    BP 114/68  Pulse 71  Resp 16  Wt 195 lb 6.4 oz (88.633 kg)  SpO2 100%     Review of Systems  Constitutional: Positive for diaphoresis and unexpected weight change.  Gastrointestinal: Positive for constipation.  Musculoskeletal: Positive for back pain.       Spasms/cramps  Neurological: Positive for numbness.       Tingling  Psychiatric/Behavioral: Positive for dysphoric mood. The patient is nervous/anxious.   All other systems reviewed and are negative.       Objective:   Physical Exam Cranial nerves,  coordination are grossly intact.  Her reflexes are diminished in upper  and lower extremities. No abnormal tone, clonus, or tremors are noted.  SLR negative bilat  Motor strength is good in both lower extremities  without focal deficit.  No new sensory changes are apparent. She has limitations in lumbar  motion in all planes.  She transitions from sitting to standing. Her gait is slow,  nonantalgic.  Lumbar motion is limited in all planes.   Right knee is examined. Full range of motion is noted. Mild effusion appreciated No medial lateral or AP and stability appreciated  WithFlexion and extension crepitus is noted. Consistent with patellofemoral arthritis.       Assessment & Plan:  1. Lumbago status post 3 spinal surgeries outlined in previous notes.  Last surgery was done May 2004, L3-S1 fusion with pedicle screws.  2. Bilateral  knee osteoarthritis. She reports the knees overall are improved would like to defer injection at this time.Patellofemoral Pain noted today  3. Right Chronic ankle pain status post remote fracture.   4. Intermittent cervicalgia.   5. Intermittent neuropathic lower extremity pain.   6. History of carpal tunnel syndrome.   7. Bilateral trochanteric bursitis, worse on left than on the right.    Continue this medication, advised patient to apply ice on her right knee for 10 minutes every hour ; Also to elevate the knee and to wear a brace when on her feet. I would like to prescribe Voltaren gel for her, but she said that would be too expensive.  I have also recommended aspirin cream over-the-counter and apply this tid to her right knee.  Overall it is doing better.  Continue to use MSContin for chronic low back pain.  F/U PCP for abdominal pain.  Uses miralex and dulcolax for constipation, this is not a problem currently.  PLAN:   Ms. Ake has limited financial resources. At this time, she  prefers just to have her pain medication refilled, morphine sulfate IR  15 mg 1 p.o. t.i.d. #90.   Her last urine drug screen was  October 10, 2011 and it was consistent. I have answered all her  questions. She is comfortable with treatment plan.  The Opioid medication pill count was appropriate.  No reported significant side effects of Opioid medications noted.  No aberrant behavior noted.  Patient cautioned regarding operation of machinery or vehicles.  Patient understands risk and benefits of these medications.  There is no indication or report of risk to self or others.

## 2012-07-07 NOTE — Patient Instructions (Signed)
Patellofemoral Pain Your exam shows your knee pain is probably due to a problem with the knee cap, the patella. This problem is also called patellofemoral pain, runner's knee, or chondromalacia. Most of the time, this problem is due to overuse of the knee joint. Repeated bending and straightening can irritate the underside of the knee cap. When this happens, activities such as running, walking, climbing, biking or jumping usually produce pain. Pain may also occur after prolonged sitting. Other patellofemoral symptoms can include joint stiffness, swelling, and a snapping or grinding sensation with movement. Rest and rehabilitation are usually successful in treating this problem. Surgery is rarely needed. Treatment includes correcting any mechanical factors that could hurt the normal working of the knee. This could be weak thigh muscles or foot problems. Avoid repetitive activities of the knee until the pain and other symptoms improve. Apply ice packs over the knee for 20 to 30 minutes every 2 to 4 hours to reduce pain and swelling. Only take over-the-counter or prescription medicines for pain, discomfort, or fever as directed by your caregiver. Knee braces or neoprene sleeves may help reduce irritation. Rehabilitation exercises to strengthen the quad muscle are often prescribed when your symptoms are better. Call your caregiver for a follow-up exam to evaluate your response to treatment. Document Released: 01/24/2005 Document Revised: 12/06/2011 Document Reviewed: 12/17/2005 [employer] Patient Information 2012 [employer]. 

## 2012-08-01 ENCOUNTER — Ambulatory Visit: Payer: Self-pay | Admitting: Physical Medicine and Rehabilitation

## 2012-08-13 ENCOUNTER — Encounter: Payer: Self-pay | Admitting: Physical Medicine and Rehabilitation

## 2012-08-13 ENCOUNTER — Encounter: Payer: Self-pay | Attending: Neurosurgery | Admitting: Physical Medicine and Rehabilitation

## 2012-08-13 VITALS — BP 123/70 | HR 75 | Resp 14 | Ht 65.0 in | Wt 199.0 lb

## 2012-08-13 DIAGNOSIS — M25562 Pain in left knee: Secondary | ICD-10-CM

## 2012-08-13 DIAGNOSIS — IMO0002 Reserved for concepts with insufficient information to code with codable children: Secondary | ICD-10-CM

## 2012-08-13 DIAGNOSIS — M79609 Pain in unspecified limb: Secondary | ICD-10-CM | POA: Insufficient documentation

## 2012-08-13 DIAGNOSIS — M25569 Pain in unspecified knee: Secondary | ICD-10-CM

## 2012-08-13 DIAGNOSIS — G8929 Other chronic pain: Secondary | ICD-10-CM

## 2012-08-13 DIAGNOSIS — M25579 Pain in unspecified ankle and joints of unspecified foot: Secondary | ICD-10-CM | POA: Insufficient documentation

## 2012-08-13 DIAGNOSIS — M542 Cervicalgia: Secondary | ICD-10-CM | POA: Insufficient documentation

## 2012-08-13 DIAGNOSIS — E669 Obesity, unspecified: Secondary | ICD-10-CM | POA: Insufficient documentation

## 2012-08-13 DIAGNOSIS — M545 Low back pain, unspecified: Secondary | ICD-10-CM

## 2012-08-13 DIAGNOSIS — M1710 Unilateral primary osteoarthritis, unspecified knee: Secondary | ICD-10-CM | POA: Insufficient documentation

## 2012-08-13 MED ORDER — MORPHINE SULFATE 15 MG PO TABS: 15.0000 mg | ORAL_TABLET | Freq: Three times a day (TID) | ORAL | Status: DC | PRN

## 2012-08-13 NOTE — Progress Notes (Signed)
Subjective:    Patient ID: Alyssa [name], female    DOB: [birth year], 56 y.o.   MRN: [id number]  HPI  Alyssa [name] is a 57 year old woman who is seen for multiple chronic pain complaints.  She has a long history of low back pain status post L3-S1 fusion back in  May 2004.   She has bilateral knee osteoarthritis, chronic ankle pain  status post remote fracture, intermittent cervicalgia, history of carpal  tunnel syndrome in the past and bilateral trochanteric bursitis.   Her average pain is between a 4 and 5 with medications. She describes  her pain is sharp, burning, aching in nature. Pain interferes with her  activity moderately. Sleep tendency poor. Pain is worse with walking  and standing and improves with rest, heat, medication.   She reports complete relief with her current meds.    Occasional abdominal pain, needs to F/u with PCP    Patient on lyrica for many years.   Pain Inventory Average Pain 4 Pain Right Now 4 My pain is sharp, burning, tingling and aching  In the last 24 hours, has pain interfered with the following? General activity 5 Relation with others 5 Enjoyment of life 5 What TIME of day is your pain at its worst? evening Sleep (in general) Poor  Pain is worse with: walking and standing Pain improves with: heat/ice and medication Relief from Meds: 10  Mobility walk without assistance how many minutes can you walk? 5 ability to climb steps?  no do you drive?  yes Do you have any goals in this area?  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  yes  Neuro/Psych numbness tingling spasms depression anxiety  Prior Studies Any changes since last visit?  no  Physicians involved in your care Any changes since last visit?  no   Family History  Problem Relation Age of Onset  . Stroke Mother   . Diabetes Mother   . Diabetes  Father    History   Social History  . Marital Status: Married    Spouse Name: N/A    Number of Children: N/A  . Years of Education: N/A   Social History Main Topics  . Smoking status: Current Everyday Smoker -- 1.0 packs/day  . Smokeless tobacco: Never Used  . Alcohol Use: None  . Drug Use: None  . Sexually Active: None   Other Topics Concern  . None   Social History Narrative  . None   Past Surgical History  Procedure Date  . Abdominal hysterectomy    Past Medical History  Diagnosis Date  . Cervicalgia   . Pain in limb   . Lumbago   . Primary localized osteoarthrosis, lower leg   . Enthesopathy of hip region    BP 123/70  Pulse 75  Resp 14  Ht 5\' 5"  (1.651 m)  Wt 199 lb (90.266 kg)  BMI 33.12 kg/m2  SpO2 98%     Review of Systems  Constitutional: Positive for diaphoresis and unexpected weight change.  Gastrointestinal: Positive for constipation.  Musculoskeletal: Positive for myalgias, back pain, arthralgias and gait problem.  Neurological: Positive for numbness.  Psychiatric/Behavioral: Positive for dysphoric mood. The patient is nervous/anxious.   All other systems reviewed and are negative.       Objective:   Physical Exam  Cranial nerves,  coordination are grossly intact.  Her reflexes are diminished  in upper  and lower extremities. No abnormal tone, clonus, or tremors are noted.  SLR negative bilat  Motor strength is good in both lower extremities without focal deficit.  No new sensory changes are apparent. She has limitations in lumbar  motion in all planes.  She transitions from sitting to standing. Her gait is slow,  nonantalgic.  Lumbar motion is limited in all planes.  Right knee is examined. Full range of motion is noted. Mild effusion appreciated  No medial lateral or AP and stability appreciated  WithFlexion and extension crepitus is noted. Consistent with patellofemoral arthritis.        Assessment & Plan:  1. Lumbago  status post 3 spinal surgeries outlined in previous notes.      Last surgery was done May 2004, L3-S1 fusion with pedicle screws.  2. Bilateral knee osteoarthritis. She reports the knees overall are improved would like to defer injection at this time.Patellofemoral Pain noted today  3. Right Chronic ankle pain status post remote fracture.  4. Intermittent cervicalgia.  5. Intermittent neuropathic lower extremity pain.  6. History of carpal tunnel syndrome.  7. Bilateral trochanteric bursitis, worse on left than on the right.  Continue this medication, advised patient to apply ice on her right knee for 10 minutes every hour ; Also to elevate the knee and to wear a brace when on her feet. I would like to prescribe Voltaren gel for her, but she said that would be too expensive.  I have also recommended aspirin cream over-the-counter and apply this tid to her right knee.  Overall knee is doing better.  Continue to use MSContin for chronic low back pain.  F/U PCP for abdominal pain.  Uses miralex and dulcolax for constipation, this is not a problem currently.    PLAN:  [name] has limited financial resources. At this time, she  prefers just to have her pain medication refilled, morphine sulfate IR  15 mg 1 p.o. t.i.d. #90.     Her last urine drug screen was:   Notes Recorded by Caryl Ada, CMA on 04/18/2012 at 10:52 AM Consistent    Last Resulted: 04/14/12 10:33 AM                 I have answered all her  questions. She is comfortable with treatment plan.  The Opioid medication pill count was appropriate. No reported significant side effects of Opioid medications noted. No aberrant behavior noted. Patient cautioned regarding operation of machinery or vehicles. Patient understands risk and benefits of these medications. There is no indication or report of risk to self or others.

## 2012-08-13 NOTE — Patient Instructions (Addendum)
I have refilled your pain medications for you today.  Please make sure you keep them locked up and in a secure location.  Please refrain from driving or operating machinery while taking these medications.  I will see you back in one month.  Please don't forget to bring your bottles and so we can monitor your medications.    Hip Bursitis Bursitis is a puffiness (swelling) and soreness of a fluid-filled sac (bursa). This sac covers and protects the joint. HOME CARE Put ice on the injured area.  Put ice in a plastic bag.  Place a towel between your skin and the bag.  Leave the ice on for 15 to 20 minutes, 3 to 4 times a day.  Rest the painful joint as much as possible. Move your joint at least 4 times a day. When pain lessens, start normal, slow movements and normal activities.      Get a massage to lessen pain.    GET HELP RIGHT AWAY IF: Your pain increases or does not improve during treatment.  You have a fever.  You feel heat coming from the affected area.  You see redness and puffiness around the affected area.  You have any questions or concerns.  MAKE SURE YOU: Understand these instructions.  Will watch your condition.  Will get help right away if you are not well or get worse.  Document Released: 01/19/2011 Document Revised: 12/06/2011 Document Reviewed: 01/19/2011 [facility] Patient Information 2012 [place], [place].Hip Bursitis Bursitis is a swelling and soreness (inflammation) of a fluid-filled sac (bursa). This sac overlies and protects the joints.  CAUSES   Injury.   Overuse of the muscles surrounding the joint.   Arthritis.   Gout.   Infection.   Cold weather.   Inadequate warm-up and conditioning prior to activities.    The cause may not be known.  SYMPTOMS   Mild to severe irritation.   Tenderness and swelling over the outside of the hip.   Pain with motion of the hip.   If the bursa becomes infected, a fever may be present. Redness,  tenderness, and warmth will develop over the hip.  Symptoms usually lessen in 3 to 4 weeks with treatment, but can come back.   TREATMENT If conservative treatment does not work, your caregiver may advise draining the bursa and injecting cortisone into the area. This may speed up the healing process. This may also be used as an initial treatment of choice.   HOME CARE INSTRUCTIONS   Apply ice to the affected area for 15 to 20 minutes every 3 to 4 hours while awake for the first 2 days. Put the ice in a plastic bag and place a towel between the bag of ice and your skin.   Rest the painful joint as much as possible, but continue to put the joint through a normal range of motion at least 4 times per day. When the pain lessens, begin normal, slow movements and usual activities to help prevent stiffness of the hip.   Only take over-the-counter or prescription medicines for pain, discomfort, or fever as directed by your caregiver.   Use crutches to limit weight bearing on the hip joint, if advised.   Elevate your painful hip to reduce swelling. Use pillows for propping and cushioning your legs and hips.   Gentle massage may provide comfort and decrease swelling.  SEEK IMMEDIATE MEDICAL CARE IF:   Your pain increases even during treatment, or you are not improving.   You  have a fever.   You have heat and inflammation over the involved bursa.   You have any other questions or concerns.  MAKE SURE YOU:   Understand these instructions.   Will watch your condition.   Will get help right away if you are not doing well or get worse.  Document Released: 06/08/2002 Document Revised: 12/06/2011 Document Reviewed: 01/05/2009 [facility] Patient Information 2012 [place], [place].    Degenerative Arthritis You have osteoarthritis. This is the wear and tear arthritis that comes with aging. It is also called degenerative arthritis. This is common in people past middle age. It is caused by stress on  the joints. The large weight bearing joints of the lower extremities are most often affected. The knees, hips, back, neck, and hands can become painful, swollen, and stiff. This is the most common type of arthritis. It comes on with age, carrying too much weight, or from an injury. Treatment includes resting the sore joint until the pain and swelling improve. Crutches or a walker may be needed for severe flares. Only take over-the-counter or prescription medicines for pain, discomfort, or fever as directed by your caregiver. Local heat therapy may improve motion. Cortisone shots into the joint are sometimes used to reduce pain and swelling during flares. Osteoarthritis is usually not crippling and progresses slowly. There are things you can do to decrease pain:  Avoid high impact activities.   Exercise regularly.   Low impact exercises such as walking, biking and swimming help to keep the muscles strong and keep normal joint function.   Stretching helps to keep your range of motion.   Lose weight if you are overweight. This reduces joint stress.  In severe cases when you have pain at rest or increasing disability, joint surgery may be helpful. See your caregiver for follow-up treatment as recommended.  SEEK IMMEDIATE MEDICAL CARE IF:   You have severe joint pain.   Marked swelling and redness in your joint develops.   You develop a high fever.  Document Released: 12/17/2005 Document Revised: 12/06/2011 Document Reviewed: 05/19/2007 [facility] Patient Information 2012 [place], [place].Osteoarthritis Osteoarthritis is the most common form of arthritis. It is redness, soreness, and swelling (inflammation) affecting the cartilage. Cartilage acts as a cushion, covering the ends of bones where they meet to form a joint. CAUSES  Over time, the cartilage begins to wear away. This causes bone to rub on bone. This produces pain and stiffness in the affected joints. Factors that contribute to this problem  are:  Excessive body weight.   Age.   Overuse of joints.  SYMPTOMS   People with osteoarthritis usually experience joint pain, swelling, or stiffness.   Over time, the joint may lose its normal shape.   Small deposits of bone (osteophytes) may grow on the edges of the joint.   Bits of bone or cartilage can break off and float inside the joint space. This may cause more pain and damage.   Osteoarthritis can lead to depression, anxiety, feelings of helplessness, and limitations on daily activities.  The most commonly affected joints are in the:  Ends of the fingers.   Thumbs.   Neck.   Lower back.   Knees.   Hips.  DIAGNOSIS  Diagnosis is mostly based on your symptoms and exam. Tests may be helpful, including:  X-rays of the affected joint.   A computerized magnetic scan (MRI).   Blood tests to rule out other types of arthritis.   Joint fluid tests. This involves using a  needle to draw fluid from the joint and examining the fluid under a microscope.  TREATMENT  Goals of treatment are to control pain, improve joint function, maintain a normal body weight, and maintain a healthy lifestyle. Treatment approaches may include:  A prescribed exercise program with rest and joint relief.   Weight control with nutritional education.   Pain relief techniques such as:   Properly applied heat and cold.   Electric pulses delivered to nerve endings under the skin (transcutaneous electrical nerve stimulation, TENS).   Massage.   Certain supplements. Ask your caregiver before using any supplements, especially in combination with prescribed drugs.   Medicines to control pain, such as:   Acetaminophen.   Nonsteroidal anti-inflammatory drugs (NSAIDs), such as naproxen.   Narcotic or central-acting agents, such as tramadol. This drug carries a risk of addiction and is generally prescribed for short-term use.   Corticosteroids. These can be given orally or as injection. This  is a short-term treatment, not recommended for routine use.   Surgery to reposition the bones and relieve pain (osteotomy) or to remove loose pieces of bone and cartilage. Joint replacement may be needed in advanced states of osteoarthritis.  HOME CARE INSTRUCTIONS  Your caregiver can recommend specific types of exercise. These may include:  Strengthening exercises. These are done to strengthen the muscles that support joints affected by arthritis. They can be performed with weights or with exercise bands to add resistance.   Aerobic activities. These are exercises, such as brisk walking or low-impact aerobics, that get your heart pumping. They can help keep your lungs and circulatory system in shape.   Range-of-motion activities. These keep your joints limber.   Balance and agility exercises. These help you maintain daily living skills.  Learning about your condition and being actively involved in your care will help improve the course of your osteoarthritis. SEEK MEDICAL CARE IF:   You feel hot or your skin turns red.   You develop a rash in addition to your joint pain.   You have an oral temperature above 102 F (38.9 C).  FOR MORE INFORMATION  National Institute of Arthritis and Musculoskeletal and Skin Diseases: www.niams.http://www.myers.net/ [employer] on Aging: https://walker.com/ American College of Rheumatology: www.rheumatology.org Document Released: 12/17/2005 Document Revised: 12/06/2011 Document Reviewed: 03/30/2010 [facility] Patient Information 2012 [place], [place].Wear and Tear Disorders of the Knee (Arthritis, Osteoarthritis) Everyone will experience wear and tear injuries (arthritis, osteoarthritis) of the knee. These are the changes we all get as we age. They come from the joint stress of daily living. The amount of cartilage damage in your knee and your symptoms determine if you need surgery. Mild problems require approximately two months recovery time. More severe problems take  several months to recover. With mild problems, your surgeon may find worn and rough cartilage surfaces. With severe changes, your surgeon may find cartilage that has completely worn away and exposed the bone. Loose bodies of bone and cartilage, bone spurs (excess bone growth), and injuries to the menisci (cushions between the large bones of your leg) are also common. All of these problems can cause pain. For a mild wear and tear problem, rough cartilage may simply need to be shaved and smoothed. For more severe problems with areas of exposed bone, your surgeon may use an instrument for roughing up the bone surfaces to stimulate new cartilage growth. Loose bodies are usually removed. Torn menisci may be trimmed or repaired. ABOUT THE ARTHROSCOPIC PROCEDURE Arthroscopy is a surgical technique. It allows your  orthopedic surgeon to diagnose and treat your knee injury with accuracy. The surgeon looks into your knee through a small scope. The scope is like a small (pencil-sized) telescope. Arthroscopy is less invasive than open knee surgery. You can expect a more rapid recovery. After the procedure, you will be moved to a recovery area until most of the effects of the medication have worn off. Your caregiver will discuss the test results with you. RECOVERY The severity of the arthritis and the type of procedure performed will determine recovery time. Other important factors include age, physical condition, medical conditions, and the type of rehabilitation program. Strengthening your muscles after arthroscopy helps guarantee a better recovery. Follow your caregiver's instructions. Use crutches, rest, elevate, ice, and do knee exercises as instructed. Your caregivers will help you and instruct you with exercises and other physical therapy required to regain your mobility, muscle strength, and functioning following surgery. Only take over-the-counter or prescription medicines for pain, discomfort, or fever as directed  by your caregiver.  SEEK MEDICAL CARE IF:   There is increased bleeding (more than a small spot) from the wound.   You notice redness, swelling, or increasing pain in the wound.   Pus is coming from wound.   You develop an unexplained oral temperature above 102 F (38.9 C) , or as your caregiver suggests.   You notice a foul smell coming from the wound or dressing.   You have severe pain with motion of the knee.  SEEK IMMEDIATE MEDICAL CARE IF:   You develop a rash.   You have difficulty breathing.   You have any allergic problems.  MAKE SURE YOU:   Understand these instructions.   Will watch your condition.   Will get help right away if you are not doing well or get worse.  Document Released: 12/14/2000 Document Revised: 12/06/2011 Document Reviewed: 05/12/2008 [facility] Patient Information 2012 [place], [place].

## 2012-09-10 ENCOUNTER — Encounter: Payer: Self-pay | Attending: Neurosurgery | Admitting: Physical Medicine and Rehabilitation

## 2012-09-10 ENCOUNTER — Encounter: Payer: Self-pay | Admitting: Physical Medicine and Rehabilitation

## 2012-09-10 VITALS — BP 105/80 | HR 70 | Resp 16 | Ht 65.0 in | Wt 196.0 lb

## 2012-09-10 DIAGNOSIS — M1710 Unilateral primary osteoarthritis, unspecified knee: Secondary | ICD-10-CM | POA: Insufficient documentation

## 2012-09-10 DIAGNOSIS — M79609 Pain in unspecified limb: Secondary | ICD-10-CM | POA: Insufficient documentation

## 2012-09-10 DIAGNOSIS — M25569 Pain in unspecified knee: Secondary | ICD-10-CM

## 2012-09-10 DIAGNOSIS — M545 Low back pain, unspecified: Secondary | ICD-10-CM | POA: Insufficient documentation

## 2012-09-10 DIAGNOSIS — G8929 Other chronic pain: Secondary | ICD-10-CM | POA: Insufficient documentation

## 2012-09-10 DIAGNOSIS — M25579 Pain in unspecified ankle and joints of unspecified foot: Secondary | ICD-10-CM | POA: Insufficient documentation

## 2012-09-10 DIAGNOSIS — M25562 Pain in left knee: Secondary | ICD-10-CM

## 2012-09-10 DIAGNOSIS — IMO0002 Reserved for concepts with insufficient information to code with codable children: Secondary | ICD-10-CM

## 2012-09-10 DIAGNOSIS — E669 Obesity, unspecified: Secondary | ICD-10-CM | POA: Insufficient documentation

## 2012-09-10 DIAGNOSIS — M542 Cervicalgia: Secondary | ICD-10-CM | POA: Insufficient documentation

## 2012-09-10 MED ORDER — MORPHINE SULFATE 15 MG PO TABS: 15.0000 mg | ORAL_TABLET | Freq: Three times a day (TID) | ORAL | Status: DC | PRN

## 2012-09-10 NOTE — Progress Notes (Signed)
Subjective:    Patient ID: Alyssa [name], female    DOB: November 27, 1956, 56 y.o.   MRN: [id number]  HPI  Alyssa [name] is a 56 year old woman who is seen for multiple chronic pain complaints.   She has a long history of low back pain status post L3-S1 fusion back in   May 2004.    She has bilateral knee osteoarthritis, chronic ankle pain   status post remote fracture, intermittent cervicalgia, history of carpal   tunnel syndrome in the past and bilateral trochanteric bursitis.    Her average pain is between a 4 and 5 with medications. She describes   her pain is sharp, burning, aching in nature. Pain interferes with her   activity moderately. Sleep tendency poor. Pain is worse with walking   and standing and improves with rest, heat, medication.    She reports complete relief with her current meds.      Occasional abdominal pain, needs to F/u with PCP      Patient on lyrica for many years.      Pain Inventory Average Pain 4 Pain Right Now 4 My pain is sharp, burning, tingling and aching  In the last 24 hours, has pain interfered with the following? General activity 4 Relation with others 4 Enjoyment of life 1 What TIME of day is your pain at its worst? evening Sleep (in general) Poor  Pain is worse with: walking and standing Pain improves with: heat/ice and medication Relief from Meds: 10  Mobility walk without assistance ability to climb steps?  no do you drive?  yes  Function I need assistance with the following:  household duties and shopping  Neuro/Psych numbness tingling spasms depression anxiety  Prior Studies Any changes since last visit?  no  Physicians involved in your care Any changes since last visit?  no   Family History  Problem Relation Age of Onset  . Stroke Mother   . Diabetes Mother   . Diabetes Father    History   Social History  . Marital Status: Married    Spouse Name: N/A    Number of Children: N/A  . Years of  Education: N/A   Social History Main Topics  . Smoking status: Current Every Day Smoker -- 1.0 packs/day  . Smokeless tobacco: Never Used  . Alcohol Use: None  . Drug Use: None  . Sexually Active: None   Other Topics Concern  . None   Social History Narrative  . None   Past Surgical History  Procedure Date  . Abdominal hysterectomy    Past Medical History  Diagnosis Date  . Cervicalgia   . Pain in limb   . Lumbago   . Primary localized osteoarthrosis, lower leg   . Enthesopathy of hip region    There were no vitals taken for this visit.      Review of Systems  Constitutional: Positive for diaphoresis and unexpected weight change.  HENT: Negative.   Eyes: Negative.   Respiratory: Negative.   Gastrointestinal: Positive for constipation.  Genitourinary: Negative.   Musculoskeletal: Positive for back pain.  Neurological: Positive for numbness.       Tingling  Hematological: Negative.   Psychiatric/Behavioral:       Depression/Anxiety       Objective:   Physical Exam  Cranial nerves,   coordination are grossly intact.   Her reflexes are diminished in upper   and lower extremities. No abnormal tone, clonus, or tremors are noted.   SLR  negative bilat   Motor strength is good in both lower extremities without focal deficit.   No new sensory changes are apparent. She has limitations in lumbar   motion in all planes.   She transitions from sitting to standing. Her gait is slow,   nonantalgic.   Lumbar motion is limited in all planes.   Right knee is examined. Full range of motion is noted. Mild effusion appreciated   No medial lateral or AP and stability appreciated   WithFlexion and extension crepitus is noted. Consistent with patellofemoral arthritis.          Assessment & Plan:  1. Lumbago status post 3 spinal surgeries outlined in previous notes.       Last surgery was done May 2004, L3-S1 fusion with pedicle screws.    2. Bilateral knee  osteoarthritis. She reports the knees overall are improved would like to defer injection at this time.Patellofemoral Pain noted today    3. Right Chronic ankle pain status post remote fracture.    4. Intermittent cervicalgia.    5. Intermittent neuropathic lower extremity pain.    6. History of carpal tunnel syndrome.    7. Bilateral trochanteric bursitis, worse on left than on the right.    Continue this medication, advised patient to apply ice on her right knee for 10 minutes every hour ; Also to elevate the knee and to wear a brace when on her feet. I would like to prescribe Voltaren gel for her, but she said that would be too expensive.   I have also recommended aspirin cream over-the-counter and apply this tid to her right knee.   Overall knee is doing better.    Continue to use MSContin for chronic low back pain.   F/U PCP for abdominal pain.   Uses miralex and dulcolax for constipation, this is not a problem currently.      PLAN:   Alyssa [name] has limited financial resources. At this time, she   prefers just to have her pain medication refilled, morphine sulfate IR   15 mg 1 p.o. t.i.d. #90.   Her last urine drug screen was:  Notes Recorded by Caryl Ada, CMA on 04/18/2012 at 10:52 AM Consistent    Last Resulted: 04/14/12 10:33 AM                         I have answered all her   questions. She is comfortable with treatment plan.   The Opioid medication pill count was appropriate. No reported significant side effects of Opioid medications noted. No aberrant behavior noted. Patient cautioned regarding operation of machinery or vehicles. Patient understands risk and benefits of these medications. There is no indication or report of risk to self or others.

## 2012-09-10 NOTE — Patient Instructions (Addendum)
I will see you back in one month  Please keep your pain meds locked up in a secure location.

## 2012-10-08 ENCOUNTER — Encounter: Payer: Self-pay | Admitting: Physical Medicine and Rehabilitation

## 2012-10-08 ENCOUNTER — Encounter: Payer: Self-pay | Attending: Neurosurgery | Admitting: Physical Medicine and Rehabilitation

## 2012-10-08 VITALS — BP 106/62 | HR 78 | Resp 16 | Ht 64.0 in | Wt 194.0 lb

## 2012-10-08 DIAGNOSIS — M25579 Pain in unspecified ankle and joints of unspecified foot: Secondary | ICD-10-CM | POA: Insufficient documentation

## 2012-10-08 DIAGNOSIS — M25571 Pain in right ankle and joints of right foot: Secondary | ICD-10-CM

## 2012-10-08 DIAGNOSIS — E669 Obesity, unspecified: Secondary | ICD-10-CM | POA: Insufficient documentation

## 2012-10-08 DIAGNOSIS — M25569 Pain in unspecified knee: Secondary | ICD-10-CM

## 2012-10-08 DIAGNOSIS — M542 Cervicalgia: Secondary | ICD-10-CM | POA: Insufficient documentation

## 2012-10-08 DIAGNOSIS — M79609 Pain in unspecified limb: Secondary | ICD-10-CM | POA: Insufficient documentation

## 2012-10-08 DIAGNOSIS — M25562 Pain in left knee: Secondary | ICD-10-CM

## 2012-10-08 DIAGNOSIS — M1710 Unilateral primary osteoarthritis, unspecified knee: Secondary | ICD-10-CM | POA: Insufficient documentation

## 2012-10-08 DIAGNOSIS — G8929 Other chronic pain: Secondary | ICD-10-CM | POA: Insufficient documentation

## 2012-10-08 DIAGNOSIS — M545 Low back pain, unspecified: Secondary | ICD-10-CM | POA: Insufficient documentation

## 2012-10-08 DIAGNOSIS — IMO0002 Reserved for concepts with insufficient information to code with codable children: Secondary | ICD-10-CM

## 2012-10-08 MED ORDER — MORPHINE SULFATE 15 MG PO TABS: 15.0000 mg | ORAL_TABLET | Freq: Three times a day (TID) | ORAL | Status: DC | PRN

## 2012-10-08 NOTE — Patient Instructions (Addendum)
Please keep your medications locked up and in a secure location.  I will see you back in one month.

## 2012-10-08 NOTE — Progress Notes (Signed)
Subjective:    Patient ID: Alyssa [name], female    DOB: 1956-08-10, 56 y.o.   MRN: [id number]  HPI Alyssa [name] is a 56 year old woman who is seen for multiple chronic pain complaints.  She has a long history of low back pain status post L3-S1 fusion back in May 2004.   She is here for a refill of her morphine 15 mg 3 times a day.   She has bilateral knee osteoarthritis, chronic ankle pain  status post remote fracture, intermittent cervicalgia, history of carpal  tunnel syndrome in the past and bilateral trochanteric bursitis.    Her average pain is between a 4 and 5 with medications. She describes  her pain is sharp, burning, aching in nature. Pain interferes with her  activity moderately. Sleep tendency poor. Pain is worse with walking  and standing and improves with rest, heat, medication.  She reports complete relief with her current meds.    Occasional abdominal pain, needs to F/u with PCP    Patient on lyrica for many years.    Alyssa [name] reports current stress from family situation at home.   Pain Inventory Average Pain 4 Pain Right Now 4 My pain is sharp, burning, tingling and aching  In the last 24 hours, has pain interfered with the following? General activity 4 Relation with others 4 Enjoyment of life 1 What TIME of day is your pain at its worst? evening Sleep (in general) Poor  Pain is worse with: walking and standing Pain improves with: heat/ice and medication Relief from Meds: 10  Mobility walk without assistance ability to climb steps?  no do you drive?  yes  Function I need assistance with the following:  household duties and shopping  Neuro/Psych numbness tingling spasms depression anxiety  Prior Studies Any changes since last visit?  no  Physicians involved in your care Any changes since last visit?  no   Family History  Problem Relation Age of Onset  . Stroke Mother   . Diabetes Mother   . Diabetes Father    History    Social History  . Marital Status: Married    Spouse Name: N/A    Number of Children: N/A  . Years of Education: N/A   Social History Main Topics  . Smoking status: Current Every Day Smoker -- 1.0 packs/day  . Smokeless tobacco: Never Used  . Alcohol Use: None  . Drug Use: None  . Sexually Active: None   Other Topics Concern  . None   Social History Narrative  . None   Past Surgical History  Procedure Date  . Abdominal hysterectomy    Past Medical History  Diagnosis Date  . Cervicalgia   . Pain in limb   . Lumbago   . Primary localized osteoarthrosis, lower leg   . Enthesopathy of hip region    There were no vitals taken for this visit.      Review of Systems  Constitutional: Negative.   HENT: Negative.   Eyes: Negative.   Respiratory: Negative.   Cardiovascular: Negative.   Gastrointestinal: Negative.   Genitourinary: Negative.   Musculoskeletal: Positive for myalgias, back pain and arthralgias.  Skin: Negative.   Neurological: Positive for numbness.  Hematological: Negative.   Psychiatric/Behavioral: Negative.        Objective:   Physical Exam  Cranial nerves,  coordination are grossly intact.  Her reflexes are diminished in upper  and lower extremities. No abnormal tone, clonus, or tremors are noted.  SLR  negative bilat  Motor strength is good in both lower extremities without focal deficit.  No new sensory changes are apparent. She has limitations in lumbar  motion in all planes.  She transitions from sitting to standing. Her gait is slow,  nonantalgic.  Lumbar motion is limited in all planes.  Right knee is examined. Full range of motion is noted. Mild effusion appreciated  No medial lateral or AP and stability appreciated  With flexion and extension crepitus is noted. Consistent with patellofemoral arthritis.        Assessment & Plan:  1. Lumbago status post 3 spinal surgeries outlined in previous notes.  Last surgery was done May  2004, L3-S1 fusion with pedicle screws.  2. Bilateral knee osteoarthritis. She reports the knees overall are improved would like to defer injection at this time.Patellofemoral Pain noted today  3. Right Chronic ankle pain status post remote fracture.  4. Intermittent cervicalgia.  5. Intermittent neuropathic lower extremity pain.  6. History of carpal tunnel syndrome.   7. Trochanteric bursitis improved  8. Stressful home environment  I have suggested counseling to help her deal with her home environment. I think she would benefit but she defers this currently. She states finances and time are the barriers.  She denies harm to self of others.    Continue to use MSContin for chronic low back pain.   Uses miralex and dulcolax for constipation, this is not a problem currently.  PLAN:  Alyssa [name] has limited financial resources. At this time, she  prefers just to have her pain medication refilled, morphine sulfate IR  15 mg 1 p.o. t.i.d. #90.   Her last urine drug screen was:  Notes Recorded by Caryl Ada, CMA on 04/18/2012 at 10:52 AM  Consistent  Last Resulted: 04/14/12 10:33 AM   I have answered all her  questions. She is comfortable with treatment plan.  The Opioid medication pill count was appropriate. No reported significant side effects of Opioid medications noted. No aberrant behavior noted. Patient cautioned regarding operation of machinery or vehicles. Patient understands risk and benefits of these medications. There is no indication or report of risk to self or others.

## 2012-10-10 ENCOUNTER — Ambulatory Visit: Payer: Self-pay | Admitting: Physical Medicine and Rehabilitation

## 2012-11-03 ENCOUNTER — Encounter: Payer: Self-pay | Admitting: Physical Medicine and Rehabilitation

## 2012-11-03 ENCOUNTER — Encounter: Payer: Self-pay | Attending: Neurosurgery | Admitting: Physical Medicine and Rehabilitation

## 2012-11-03 VITALS — BP 122/65 | HR 72 | Resp 14 | Ht 64.0 in | Wt 191.0 lb

## 2012-11-03 DIAGNOSIS — IMO0002 Reserved for concepts with insufficient information to code with codable children: Secondary | ICD-10-CM

## 2012-11-03 DIAGNOSIS — M179 Osteoarthritis of knee, unspecified: Secondary | ICD-10-CM

## 2012-11-03 DIAGNOSIS — M549 Dorsalgia, unspecified: Secondary | ICD-10-CM | POA: Insufficient documentation

## 2012-11-03 DIAGNOSIS — M25571 Pain in right ankle and joints of right foot: Secondary | ICD-10-CM

## 2012-11-03 DIAGNOSIS — M25561 Pain in right knee: Secondary | ICD-10-CM

## 2012-11-03 DIAGNOSIS — Z5181 Encounter for therapeutic drug level monitoring: Secondary | ICD-10-CM

## 2012-11-03 DIAGNOSIS — M25579 Pain in unspecified ankle and joints of unspecified foot: Secondary | ICD-10-CM

## 2012-11-03 DIAGNOSIS — Z760 Encounter for issue of repeat prescription: Secondary | ICD-10-CM

## 2012-11-03 DIAGNOSIS — G8929 Other chronic pain: Secondary | ICD-10-CM

## 2012-11-03 DIAGNOSIS — M25569 Pain in unspecified knee: Secondary | ICD-10-CM

## 2012-11-03 DIAGNOSIS — M545 Low back pain, unspecified: Secondary | ICD-10-CM

## 2012-11-03 DIAGNOSIS — M1710 Unilateral primary osteoarthritis, unspecified knee: Secondary | ICD-10-CM

## 2012-11-03 MED ORDER — MORPHINE SULFATE 15 MG PO TABS: 15.0000 mg | ORAL_TABLET | Freq: Three times a day (TID) | ORAL | Status: DC | PRN

## 2012-11-03 NOTE — Patient Instructions (Addendum)
Please keep your pain medications locked up and in a secure location.   do not drive or operate machinery while taking this medicine as it can make you sedated  I will see you back in one month for refill of your medication.  - Weight can influence pain in more than one way. Carrying a small bag of groceries with a few items may not cause any difficulties, but a full 10 lb (4.5 kg) bag of groceries can cause or worsen hand or knee pain depending upon the vulnerable joints. Pain can also occur from carrying too much body weight.

## 2012-11-03 NOTE — Progress Notes (Signed)
Subjective:    Patient ID: Alyssa [name], female    DOB: 1956-09-13, 56 y.o.   MRN: [id number]  HPI Alyssa [name] is a 56 year old woman who is seen for multiple chronic pain complaints.  She has a long history of low back pain status post L3-S1 fusion back in May 2004.   She is here for a refill of her morphine 15 mg 3 times a day.   She has bilateral knee osteoarthritis, chronic ankle pain  status post remote fracture, intermittent cervicalgia, history of carpal  tunnel syndrome in the past and bilateral trochanteric bursitis.   Her average pain is between a 4 and 5 with medications. She describes  her pain is constant sharp, burning, aching in nature. Pain interferes with her  activity moderately. Sleep tendency poor. Pain is worse with walking  and standing and improves with rest, heat, medication.   She reports complete relief with her current meds.   Occasional abdominal pain, needs to F/u with PCP  Patient on lyrica for many years.   Alyssa [name] reports current stress from family situation at home.   Pain Inventory Average Pain 4 Pain Right Now 4 My pain is sharp, burning, tingling and aching  In the last 24 hours, has pain interfered with the following? General activity 4 Relation with others 4 Enjoyment of life 1 What TIME of day is your pain at its worst? evening Sleep (in general) Poor  Pain is worse with: walking and standing Pain improves with: heat/ice and medication Relief from Meds: 10  Mobility walk without assistance how many minutes can you walk? 5 ability to climb steps?  no do you drive?  yes Do you have any goals in this area?  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  no  Physicians involved in your care Any changes since last visit?  no   Family History    Problem Relation Age of Onset  . Stroke Mother   . Diabetes Mother   . Diabetes Father    History   Social History  . Marital Status: Married    Spouse Name: N/A    Number of Children: N/A  . Years of Education: N/A   Social History Main Topics  . Smoking status: Current Every Day Smoker -- 1.0 packs/day  . Smokeless tobacco: Never Used  . Alcohol Use: None  . Drug Use: None  . Sexually Active: None   Other Topics Concern  . None   Social History Narrative  . None   Past Surgical History  Procedure Date  . Abdominal hysterectomy    Past Medical History  Diagnosis Date  . Cervicalgia   . Pain in limb   . Lumbago   . Primary localized osteoarthrosis, lower leg   . Enthesopathy of hip region    BP 122/65  Pulse 72  Resp 14  Ht 5\' 4"  (1.626 m)  Wt 191 lb (86.637 kg)  BMI 32.79 kg/m2  SpO2 100%     Review of Systems  Constitutional: Positive for unexpected weight change.  Musculoskeletal: Positive for myalgias, back pain and arthralgias.  Neurological: Positive for numbness.  Psychiatric/Behavioral: Positive for dysphoric mood. The patient is nervous/anxious.   All other systems reviewed and are negative.       Objective:   Physical Exam Cranial nerves,  coordination are  grossly intact.  Her reflexes are diminished in upper  and lower extremities. No abnormal tone, clonus, or tremors are noted.  SLR negative bilat  Motor strength is good in both lower extremities without focal deficit.  No new sensory changes are apparent. She has limitations in lumbar  motion in all planes.  She transitions from sitting to standing. Her gait is slow,  nonantalgic stable. Lumbar motion is limited in all planes.  Right knee is examined. Full range of motion is noted. No  effusion appreciated. Crepitus with  flexion/extension No medial lateral or AP and stability appreciated  Consistent with patellofemoral arthritis.         Assessment & Plan:  1. Lumbago  status post 3 spinal surgeries outlined in previous notes. Stable Last surgery was done May 2004, L3-S1 fusion with pedicle screws.   2. Medication refill/monitering: noted below. 3. Bilateral knee osteoarthritis. She reports the knees overall are improved would like to defer injection at this time.Patellofemoral Pain noted today  4. Right Chronic ankle pain status post remote fracture. Stable 5. Intermittent cervicalgia. Stable 6. Intermittent neuropathic lower extremity pain. Stable 7. History of carpal tunnel syndrome. Stable 8. Trochanteric bursitis improved  9. Stressful home environment: recommend counseling  I have suggested counseling to help her deal with her home environment. I think she would benefit but she defers this currently. She states finances and time are the barriers.    She denies HI/SI  Plan:  Encourage to exercise and stay active.  Pt reports she is using bicycle and has lost 5 pounds   Continue to use MSContin for chronic low back pain.  Uses miralex and dulcolax for constipation, this is not a problem currently.   Alyssa [name] has limited financial resources. Her pain complaints are stable. At this time, she  prefers just to have her pain medication refilled, morphine sulfate IR  15 mg 1 p.o. t.i.d. #90.   Her last urine drug screen was:  Notes Recorded by Caryl Ada, CMA on 04/18/2012 at 10:52 AM  Consistent  Last Resulted: 04/14/12 10:33 AM  I have answered all her  questions. She is comfortable with treatment plan.  The Opioid medication pill count was appropriate. No reported significant side effects of Opioid medications noted. No aberrant behavior noted. Patient cautioned regarding operation of machinery or vehicles. Patient understands risk and benefits of these medications. There is no indication or report of risk to self or others.   [place] Controlled Substance Reporting accessed 11/04/2013showed no evidence of aberrent narcotic use

## 2012-12-01 ENCOUNTER — Encounter: Payer: Self-pay | Attending: Neurosurgery | Admitting: Physical Medicine and Rehabilitation

## 2012-12-01 ENCOUNTER — Encounter: Payer: Self-pay | Admitting: Physical Medicine and Rehabilitation

## 2012-12-01 VITALS — BP 130/74 | HR 72 | Resp 14 | Ht 65.0 in | Wt 195.0 lb

## 2012-12-01 DIAGNOSIS — M79609 Pain in unspecified limb: Secondary | ICD-10-CM | POA: Insufficient documentation

## 2012-12-01 DIAGNOSIS — M545 Low back pain, unspecified: Secondary | ICD-10-CM | POA: Insufficient documentation

## 2012-12-01 DIAGNOSIS — E669 Obesity, unspecified: Secondary | ICD-10-CM | POA: Insufficient documentation

## 2012-12-01 DIAGNOSIS — M25569 Pain in unspecified knee: Secondary | ICD-10-CM

## 2012-12-01 DIAGNOSIS — Z5181 Encounter for therapeutic drug level monitoring: Secondary | ICD-10-CM

## 2012-12-01 DIAGNOSIS — M25579 Pain in unspecified ankle and joints of unspecified foot: Secondary | ICD-10-CM | POA: Insufficient documentation

## 2012-12-01 DIAGNOSIS — Z760 Encounter for issue of repeat prescription: Secondary | ICD-10-CM

## 2012-12-01 DIAGNOSIS — G8929 Other chronic pain: Secondary | ICD-10-CM | POA: Insufficient documentation

## 2012-12-01 DIAGNOSIS — M1710 Unilateral primary osteoarthritis, unspecified knee: Secondary | ICD-10-CM | POA: Insufficient documentation

## 2012-12-01 DIAGNOSIS — M542 Cervicalgia: Secondary | ICD-10-CM | POA: Insufficient documentation

## 2012-12-01 MED ORDER — MORPHINE SULFATE 15 MG PO TABS: 15.0000 mg | ORAL_TABLET | Freq: Three times a day (TID) | ORAL | Status: DC | PRN

## 2012-12-01 NOTE — Progress Notes (Signed)
Subjective:    Patient ID: Alyssa [name], female    DOB: 18-Sep-1956, 56 y.o.   MRN: [id number]  HPI  Alyssa [name] is a 56 year old woman who is seen for multiple chronic pain complaints.  She has a long history of low back pain status post L3-S1 fusion back in May 2004.  She is here for a refill of her morphine 15 mg 3 times a day.  She has bilateral knee osteoarthritis, chronic ankle pain  status post remote fracture, intermittent cervicalgia, history of carpal  tunnel syndrome in the past and bilateral trochanteric bursitis.  Her average pain is between a 4 and 5 with medications. She describes  her pain is constant sharp, burning, aching in nature. Pain interferes with her  activity moderately. Sleep tendency poor. Pain is worse with walking  and standing and improves with rest, heat, medication.  She reports complete relief with her current meds.  Occasional abdominal pain, needs to F/u with PCP  Patient on lyrica for many years.  Alyssa [name] reports current stress from family situation at home.      Pain Inventory Average Pain 4 Pain Right Now 4 My pain is sharp, burning, tingling and aching  In the last 24 hours, has pain interfered with the following? General activity 4 Relation with others 4 Enjoyment of life 1 What TIME of day is your pain at its worst? evening Sleep (in general) Poor  Pain is worse with: walking and standing Pain improves with: heat/ice Relief from Meds: 10  Mobility walk without assistance how many minutes can you walk? 5 Do you have any goals in this area?  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  no  Physicians involved in your care Any changes since last visit?  no   Family History  Problem Relation Age of Onset  . Stroke Mother   . Diabetes Mother   .  Diabetes Father    History   Social History  . Marital Status: Married    Spouse Name: N/A    Number of Children: N/A  . Years of Education: N/A   Social History Main Topics  . Smoking status: Current Every Day Smoker -- 1.0 packs/day  . Smokeless tobacco: Never Used  . Alcohol Use: None  . Drug Use: None  . Sexually Active: None   Other Topics Concern  . None   Social History Narrative  . None   Past Surgical History  Procedure Date  . Abdominal hysterectomy    Past Medical History  Diagnosis Date  . Cervicalgia   . Pain in limb   . Lumbago   . Primary localized osteoarthrosis, lower leg   . Enthesopathy of hip region    BP 130/74  Pulse 72  Resp 14  Ht 5\' 5"  (1.651 m)  Wt 195 lb (88.451 kg)  BMI 32.45 kg/m2  SpO2 96%     Review of Systems  Constitutional: Positive for unexpected weight change.  Gastrointestinal: Positive for constipation.  Neurological: Positive for numbness.  Psychiatric/Behavioral: The patient is nervous/anxious.   All other systems reviewed and are negative.          Physical Exam Objective:  Cranial nerves,  coordination are grossly intact.  Her reflexes are diminished in upper  and lower extremities. No abnormal tone, clonus, or tremors are noted.  SLR  negative bilat  Motor strength is good in both lower extremities without focal deficit.  No new sensory changes are apparent. She has limitations in lumbar  motion in all planes.  She transitions from sitting to standing. Her gait is slow,  nonantalgic stable.  Lumbar motion is limited in all planes.  Right knee is examined. Full range of motion is noted. No effusion appreciated. Crepitus with flexion/extension  No medial lateral or AP and stability appreciated  Consistent with patellofemoral arthritis.            Assessment & Plan:  1. Lumbago status post 3 spinal surgeries outlined in previous notes. Stable  Last surgery was done May 2004, L3-S1 fusion with  pedicle screws.  2. Medication refill/monitering: noted below.  3. Bilateral knee osteoarthritis. She reports the knees overall are improved would like to defer injection at this time.Patellofemoral Pain noted today  4. Right Chronic ankle pain status post remote fracture. Stable  5. Intermittent cervicalgia. Stable  6. Intermittent neuropathic lower extremity pain. Stable  7. History of carpal tunnel syndrome. Stable  8. Trochanteric bursitis improved  9. Stressful home environment: recommend counseling  I have suggested counseling to help her deal with her home environment. I think she would benefit but she defers this currently. She states finances and time are the barriers.  She denies HI/SI    Plan:  Encourage to exercise and stay active. Pt reports she is using bicycle and has lost 5 pounds month before but gained some over Thanksgiving. Continue to use MSContin for chronic low back pain.  Uses miralex and dulcolax for constipation, this is not a problem currently.  Alyssa [name] has limited financial resources. Her pain complaints are stable. At this time, she  prefers just to have her pain medication refilled, morphine sulfate IR  15 mg 1 p.o. t.i.d. #90.   Her last urine drug screen was:  11.04.13 consistant  I have answered all her  questions. She is comfortable with treatment plan.  The Opioid medication pill count was appropriate. No reported significant side effects of Opioid medications noted. No aberrant behavior noted. Patient cautioned regarding operation of machinery or vehicles. Patient understands risk and benefits of these medications. There is no indication or report of risk to self or others.   [place] Controlled Substance Reporting accessed 11/03/2012 showed no evidence of aberrent narcotic use

## 2012-12-01 NOTE — Patient Instructions (Addendum)
I have refilled your pain medication.  Please keep your medications locked up and in a secure location.  I will see you back in one month

## 2012-12-12 ENCOUNTER — Telehealth: Payer: Self-pay | Admitting: Physical Medicine and Rehabilitation

## 2012-12-12 NOTE — Telephone Encounter (Signed)
For Dr. Pamelia Hoit: Patient has questions regarding the different levels of service selected for her October and November visits.  She is a self-pay, and has full responsibility for payment of her charges.  In October, she was charged for a level II visit [id number] but in November she was charged a level IV (19147).  She is wondering if it is a mistake.  I did explain to her that there are different levels of service based on time and the level of complexity of her case.  If the charges are correct, can you help explain what the difference in the two visits were, that would create the need for a higher charge in November? (I see that she was also charged a level IV in December, as well, but she has not received an invoice for that visit, yet.) Thanks, [employer].

## 2012-12-17 NOTE — Telephone Encounter (Signed)
Alyssa [name] and I have discussed this and  we will resolve the issue. Please let pt know we are working on it.

## 2013-01-14 ENCOUNTER — Encounter: Payer: Self-pay | Admitting: Physical Medicine and Rehabilitation

## 2013-01-14 ENCOUNTER — Encounter: Payer: Self-pay | Attending: Neurosurgery | Admitting: Physical Medicine and Rehabilitation

## 2013-01-14 VITALS — BP 103/64 | HR 70 | Resp 14 | Ht 65.0 in | Wt 195.0 lb

## 2013-01-14 DIAGNOSIS — Z5181 Encounter for therapeutic drug level monitoring: Secondary | ICD-10-CM

## 2013-01-14 DIAGNOSIS — M545 Low back pain, unspecified: Secondary | ICD-10-CM | POA: Insufficient documentation

## 2013-01-14 DIAGNOSIS — M1710 Unilateral primary osteoarthritis, unspecified knee: Secondary | ICD-10-CM | POA: Insufficient documentation

## 2013-01-14 DIAGNOSIS — M25579 Pain in unspecified ankle and joints of unspecified foot: Secondary | ICD-10-CM | POA: Insufficient documentation

## 2013-01-14 DIAGNOSIS — M542 Cervicalgia: Secondary | ICD-10-CM | POA: Insufficient documentation

## 2013-01-14 DIAGNOSIS — E669 Obesity, unspecified: Secondary | ICD-10-CM | POA: Insufficient documentation

## 2013-01-14 DIAGNOSIS — G8929 Other chronic pain: Secondary | ICD-10-CM | POA: Insufficient documentation

## 2013-01-14 DIAGNOSIS — M25569 Pain in unspecified knee: Secondary | ICD-10-CM

## 2013-01-14 DIAGNOSIS — M79609 Pain in unspecified limb: Secondary | ICD-10-CM | POA: Insufficient documentation

## 2013-01-14 DIAGNOSIS — Z760 Encounter for issue of repeat prescription: Secondary | ICD-10-CM

## 2013-01-14 MED ORDER — MORPHINE SULFATE 15 MG PO TABS: 15.0000 mg | ORAL_TABLET | Freq: Three times a day (TID) | ORAL | Status: DC | PRN

## 2013-01-14 NOTE — Progress Notes (Signed)
Subjective:    Patient ID: Alyssa [name], female    DOB: 1956/06/08, 57 y.o.   MRN: [id number]  HPI Alyssa [name] is a 57 year old woman who is seen for multiple chronic pain complaints.  She has a long history of low back pain status post L3-S1 fusion back in May 2004.  She is here for a refill of her morphine 15 mg 3 times a day.  She has bilateral knee osteoarthritis, chronic ankle pain  status post remote fracture, intermittent cervicalgia, history of carpal  tunnel syndrome in the past and bilateral trochanteric bursitis.  Her average pain is between a 4 and 5 with medications. She describes  her pain is constant sharp, burning, aching in nature. Pain interferes with her  activity moderately. Sleep tendency poor. Pain is worse with walking  and standing and improves with rest, heat, medication.  She reports complete relief with her current meds.  Occasional abdominal pain, needs to F/u with PCP  Patient on lyrica for many years.    Patient reports able pain today  No new problems noted.  No history of falls or injuries  Patient is here for a refill of her pain medication.   Pain Inventory Average Pain 4 Pain Right Now 4 My pain is sharp, burning, tingling and aching  In the last 24 hours, has pain interfered with the following? General activity 4 Relation with others 4 Enjoyment of life 1 What TIME of day is your pain at its worst? evening Sleep (in general) Poor  Pain is worse with: walking and standing Pain improves with: heat/ice Relief from Meds: 5  Mobility walk without assistance how many minutes can you walk? 5 Do you have any goals in this area?  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  no  Physicians involved in your care Any changes since last visit?  no   Family History    Problem Relation Age of Onset  . Stroke Mother   . Diabetes Mother   . Diabetes Father    History   Social History  . Marital Status: Married    Spouse Name: N/A    Number of Children: N/A  . Years of Education: N/A   Social History Main Topics  . Smoking status: Current Every Day Smoker -- 1.0 packs/day  . Smokeless tobacco: Never Used  . Alcohol Use: None  . Drug Use: None  . Sexually Active: None   Other Topics Concern  . None   Social History Narrative  . None   Past Surgical History  Procedure Date  . Abdominal hysterectomy    Past Medical History  Diagnosis Date  . Cervicalgia   . Pain in limb   . Lumbago   . Primary localized osteoarthrosis, lower leg   . Enthesopathy of hip region    BP 103/64  Pulse 70  Resp 14  Ht 5\' 5"  (1.651 m)  Wt 195 lb (88.451 kg)  BMI 32.45 kg/m2  SpO2 95%     Review of Systems  Constitutional: Positive for unexpected weight change.  Gastrointestinal: Positive for constipation.  Musculoskeletal: Positive for back pain.  Neurological: Positive for numbness.  Psychiatric/Behavioral: The patient is nervous/anxious.   All other systems reviewed and are negative.       Objective:   Physical Exam  Patient is a well-developed well-nourished woman in  no apparent distress. She is her affect is overall bright cooperative pleasant follows commands without difficulty answers questions appropriately.  Cranial nerves and coordination are grossly intact  Reflexes are present but diminished in both lower extremities  Straight leg raise is negative  Motor strength is good in both lower extremities no new sensory changes are noted  Slightly decreased range of motion of right ankle  Crepitus noted at both knees without effusion full range of motion noted at both knees  Transitions without difficulty from sit to stand  Normal gait  Heel toe walking performed adequately tandem gait performed adequately  Limited lumbar  motion in all planes     Assessment & Plan:  1. Lumbago status post 3 spinal surgeries outlined in previous notes. Stable  Last surgery was done May 2004, L3-S1 fusion with pedicle screws.  2. Medication refill/monitering: noted below.  3. Bilateral knee osteoarthritis. She reports the knees overall are improved would like to defer injection at this time.Patellofemoral Pain noted today unchanged and stable 4. Right Chronic ankle pain status post remote fracture. Stable  5. Intermittent cervicalgia. Stable  6. Intermittent neuropathic lower extremity pain. Stable  7. History of carpal tunnel syndrome. Stable  8. Trochanteric bursitis improved  9. Stressful home environment: recommend counseling  I have suggested counseling to help her deal with her home environment. I think she would benefit but she defers this currently. She states finances and time are the barriers.  She denies HI/SI    Plan:  Encourage to exercise and stay active. Pt reports she is using bicycle and has lost 5 pounds month before but gained some over Thanksgiving.  Continue to use MSContin for chronic low back pain.  Uses miralex and dulcolax for constipation, this is not a problem currently.  Ms. Tyndall has limited financial resources. Her pain complaints are stable. At this time, she  prefers just to have her pain medication refilled, morphine sulfate IR  15 mg 1 p.o. t.i.d. #90.  Her last urine drug screen was:  11.04.13  consistant  I have answered all her  questions. She is comfortable with treatment plan.  The Opioid medication pill count was appropriate. No reported significant side effects of Opioid medications noted. No aberrant behavior noted. Patient cautioned regarding operation of machinery or vehicles. Patient understands risk and benefits of these medications. There is no indication or report of risk to self or others.  [place] Controlled Substance Reporting accessed 11/03/2012 showed no evidence of  aberrent narcotic use

## 2013-01-14 NOTE — Patient Instructions (Signed)
Please keep your pain medications locked up and in a secure location  Please bring your pain medication bottles to each visit  I will see you back in one month

## 2013-02-09 ENCOUNTER — Encounter: Payer: Self-pay | Attending: Neurosurgery | Admitting: Physical Medicine and Rehabilitation

## 2013-02-09 ENCOUNTER — Encounter: Payer: Self-pay | Admitting: Physical Medicine and Rehabilitation

## 2013-02-09 VITALS — BP 110/64 | HR 65 | Resp 14 | Ht 65.0 in | Wt 200.0 lb

## 2013-02-09 DIAGNOSIS — M545 Low back pain, unspecified: Secondary | ICD-10-CM | POA: Insufficient documentation

## 2013-02-09 DIAGNOSIS — M1710 Unilateral primary osteoarthritis, unspecified knee: Secondary | ICD-10-CM | POA: Insufficient documentation

## 2013-02-09 DIAGNOSIS — M542 Cervicalgia: Secondary | ICD-10-CM | POA: Insufficient documentation

## 2013-02-09 DIAGNOSIS — M25579 Pain in unspecified ankle and joints of unspecified foot: Secondary | ICD-10-CM | POA: Insufficient documentation

## 2013-02-09 DIAGNOSIS — M961 Postlaminectomy syndrome, not elsewhere classified: Secondary | ICD-10-CM

## 2013-02-09 DIAGNOSIS — G8929 Other chronic pain: Secondary | ICD-10-CM | POA: Insufficient documentation

## 2013-02-09 DIAGNOSIS — M79609 Pain in unspecified limb: Secondary | ICD-10-CM | POA: Insufficient documentation

## 2013-02-09 DIAGNOSIS — E669 Obesity, unspecified: Secondary | ICD-10-CM | POA: Insufficient documentation

## 2013-02-09 MED ORDER — MORPHINE SULFATE 15 MG PO TABS: 15.0000 mg | ORAL_TABLET | Freq: Three times a day (TID) | ORAL | Status: DC | PRN

## 2013-02-09 NOTE — Patient Instructions (Signed)
Continue with riding your stationary bike regularly.

## 2013-02-09 NOTE — Progress Notes (Signed)
Subjective:    Patient ID: Alyssa [name], female    DOB: [birth year], 57 y.o.   MRN: [id number]  HPI The patient complains about chronic knee pain, mildly swollen right knee . The patient also complains about chronic low back pain, and numbness and tingling in both feet.  The problem has been stable.The patient states that she only got 60 tablets of her morphine prescribed, instead of the 90, she needs a refill, because she only got 60 prescribed she is due.    Pain Inventory Average Pain 4 Pain Right Now 4 My pain is sharp, burning, tingling and aching  In the last 24 hours, has pain interfered with the following? General activity 4 Relation with others 4 Enjoyment of life 1 What TIME of day is your pain at its worst? evening Sleep (in general) Poor  Pain is worse with: walking and standing Pain improves with: heat/ice Relief from Meds: 6  Mobility walk without assistance how many minutes can you walk? 5 ability to climb steps?  yes do you drive?  yes Do you have any goals in this area?  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  no  Physicians involved in your care Any changes since last visit?  no   Family History  Problem Relation Age of Onset  . Stroke Mother   . Diabetes Mother   . Diabetes Father    History   Social History  . Marital Status: Married    Spouse Name: N/A    Number of Children: N/A  . Years of Education: N/A   Social History Main Topics  . Smoking status: Current Every Day Smoker -- 1.00 packs/day  . Smokeless tobacco: Never Used  . Alcohol Use: None  . Drug Use: None  . Sexually Active: None   Other Topics Concern  . None   Social History Narrative  . None   Past Surgical History  Procedure Laterality Date  . Abdominal hysterectomy     Past Medical  History  Diagnosis Date  . Cervicalgia   . Pain in limb   . Lumbago   . Primary localized osteoarthrosis, lower leg   . Enthesopathy of hip region    BP 110/64  Pulse 65  Resp 14  Ht 5\' 5"  (1.651 m)  Wt 200 lb (90.719 kg)  BMI 33.28 kg/m2  SpO2 96%      Review of Systems  Constitutional: Positive for unexpected weight change.  HENT: Positive for neck pain.   Gastrointestinal: Positive for constipation.  Musculoskeletal: Positive for back pain.  Neurological: Positive for numbness.  Psychiatric/Behavioral: The patient is nervous/anxious.   All other systems reviewed and are negative.       Objective:   Physical Exam  Constitutional: She is oriented to person, place, and time. She appears well-developed and well-nourished.  HENT:  Head: Normocephalic.  Musculoskeletal: She exhibits tenderness.  Neurological: She is alert and oriented to person, place, and time.  Skin: Skin is warm and dry.  Psychiatric: She has a normal mood and affect.    Symmetric normal motor tone is noted throughout. Normal muscle bulk. Muscle testing reveals 5/5 muscle strength of the upper extremity, and 5/5 of the lower extremity. Full range of motion in upper and lower extremities. ROM of spine is restricted. Fine motor movements are normal in both hands.  Sensory is intact and symmetric to light touch, pinprick and proprioception.  DTR in the upper and lower extremity are present and symmetric 2+. No clonus is noted.  Patient arises from chair with mild difficulty.  Mild effusion of right knee, in lateral aspect, no redness or heat noted.        Assessment & Plan:  This is a 57 year old female with  1. Chronic low back pain , Hx of PSF L3-S1 2004 2. Bilateral chronic knee pain  3. Chronic ankle pain on the right  Plan :  Continue with medication , continue with riding the stationary bike regularly.  Patient was exhausted today, she is taking care of her mother and of her disabled  child,  Advised the patient to think about her own health, and somehow try to change the current situation.  Medication was refilled today, medication was due, pill count appropriate.  Follow up in 1 month with Dr. Pamelia Hoit.

## 2013-02-11 ENCOUNTER — Encounter: Payer: Self-pay | Admitting: Physical Medicine and Rehabilitation

## 2013-03-11 ENCOUNTER — Encounter: Payer: Self-pay | Admitting: Physical Medicine and Rehabilitation

## 2013-03-11 ENCOUNTER — Encounter: Payer: Self-pay | Attending: Neurosurgery | Admitting: Physical Medicine and Rehabilitation

## 2013-03-11 VITALS — BP 124/74 | HR 67 | Resp 14 | Ht 65.0 in | Wt 199.0 lb

## 2013-03-11 DIAGNOSIS — IMO0002 Reserved for concepts with insufficient information to code with codable children: Secondary | ICD-10-CM

## 2013-03-11 DIAGNOSIS — E669 Obesity, unspecified: Secondary | ICD-10-CM | POA: Insufficient documentation

## 2013-03-11 DIAGNOSIS — M25569 Pain in unspecified knee: Secondary | ICD-10-CM

## 2013-03-11 DIAGNOSIS — M542 Cervicalgia: Secondary | ICD-10-CM | POA: Insufficient documentation

## 2013-03-11 DIAGNOSIS — M1710 Unilateral primary osteoarthritis, unspecified knee: Secondary | ICD-10-CM | POA: Insufficient documentation

## 2013-03-11 DIAGNOSIS — M545 Low back pain, unspecified: Secondary | ICD-10-CM

## 2013-03-11 DIAGNOSIS — M25562 Pain in left knee: Secondary | ICD-10-CM

## 2013-03-11 DIAGNOSIS — Z5181 Encounter for therapeutic drug level monitoring: Secondary | ICD-10-CM

## 2013-03-11 DIAGNOSIS — G8929 Other chronic pain: Secondary | ICD-10-CM

## 2013-03-11 DIAGNOSIS — Z760 Encounter for issue of repeat prescription: Secondary | ICD-10-CM

## 2013-03-11 DIAGNOSIS — M79609 Pain in unspecified limb: Secondary | ICD-10-CM | POA: Insufficient documentation

## 2013-03-11 DIAGNOSIS — M25579 Pain in unspecified ankle and joints of unspecified foot: Secondary | ICD-10-CM | POA: Insufficient documentation

## 2013-03-11 MED ORDER — MORPHINE SULFATE 15 MG PO TABS: 15.0000 mg | ORAL_TABLET | Freq: Three times a day (TID) | ORAL | Status: DC | PRN

## 2013-03-11 NOTE — Progress Notes (Signed)
Subjective:    Patient ID: Alyssa [name], female    DOB: 02/17/1956, 57 y.o.   MRN: [id number]  HPI   Alyssa [name] is a 57 year old woman who is seen for multiple chronic pain complaints.  She has a long history of low back pain status post L3-S1 fusion back in May 2004.  She is here for a refill of her morphine 15 mg 3 times a day.  She has bilateral knee osteoarthritis, chronic ankle pain  status post remote fracture, intermittent cervicalgia, history of carpal  tunnel syndrome in the past and bilateral trochanteric bursitis.  Her average pain is between a 4 and 5 with medications.   There are no changes in her pain. She is here for a refill of her pain medication.    Pain Inventory Average Pain 4 Pain Right Now 4 My pain is sharp, burning, tingling and aching  In the last 24 hours, has pain interfered with the following? General activity 4 Relation with others 4 Enjoyment of life 1 What TIME of day is your pain at its worst? evening Sleep (in general) Poor  Pain is worse with: walking and standing Pain improves with: heat/ice Relief from Meds: 6  Mobility walk without assistance how many minutes can you walk? 5 do you drive?  yes Do you have any goals in this area?  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  no  Physicians involved in your care Any changes since last visit?  no   Family History  Problem Relation Age of Onset  . Stroke Mother   . Diabetes Mother   . Diabetes Father    History   Social History  . Marital Status: Married    Spouse Name: N/A    Number of Children: N/A  . Years of Education: N/A   Social History Main Topics  . Smoking status: Current Every Day Smoker -- 1.00 packs/day  . Smokeless tobacco: Never Used  . Alcohol Use: None  . Drug Use: None  .  Sexually Active: None   Other Topics Concern  . None   Social History Narrative  . None   Past Surgical History  Procedure Laterality Date  . Abdominal hysterectomy     Past Medical History  Diagnosis Date  . Cervicalgia   . Pain in limb   . Lumbago   . Primary localized osteoarthrosis, lower leg   . Enthesopathy of hip region    BP 124/74  Pulse 67  Resp 14  Ht 5\' 5"  (1.651 m)  Wt 199 lb (90.266 kg)  BMI 33.12 kg/m2  SpO2 97%     Review of Systems  Constitutional: Positive for unexpected weight change.  Gastrointestinal: Positive for constipation.  Neurological: Positive for numbness.  Psychiatric/Behavioral: The patient is nervous/anxious.   All other systems reviewed and are negative.       Objective:   Physical Exam  Patient is a well-developed well-nourished woman in no apparent distress. She is her affect is overall bright cooperative pleasant follows commands without difficulty answers questions appropriately.  Cranial nerves and coordination are grossly intact  Reflexes are present but diminished in both lower extremities  Straight leg raise is negative  Motor strength is good in both lower extremities no new sensory changes are noted  Slightly decreased range of motion of right ankle  Crepitus  noted at both knees without effusion full range of motion noted at both knees  Transitions without difficulty from sit to stand  Normal gait  Heel toe walking performed adequately tandem gait performed adequately  Limited lumbar motion in all planes  No hip pain or groin pain with internal or external rotation at hips      Assessment & Plan:  1. Lumbago status post 3 spinal surgeries outlined in previous notes. Stable  Last surgery was done May 2004, L3-S1 fusion with pedicle screws.  2. Medication refill/monitering: noted below.  3. Bilateral knee osteoarthritis. She reports the knees overall are improved would like to defer injection at this  time.Patellofemoral Pain noted today unchanged and stable  4. Right Chronic ankle pain status post remote fracture. Stable  5. Intermittent cervicalgia. Stable  6. Intermittent neuropathic lower extremity pain. Stable  7. History of carpal tunnel syndrome. Stable  8. Trochanteric bursitis improved  9. Stressful home environment: recommend counseling  I have suggested counseling to help her deal with her home environment. I think she would benefit but she defers this currently. She states finances and time are the barriers.  She denies HI/SI  Plan:  Encourage to exercise and stay active. Continue to use MSContin for chronic low back pain.  Uses miralex and dulcolax for constipation, this is not a problem currently.  [name] has limited financial resources. Her pain complaints are stable. At this time, she  prefers just to have her pain medication refilled, morphine sulfate IR  15 mg 1 p.o. t.i.d. #90.  Her last urine drug screen was:  11.04.13  consistant  I have answered all her  questions. She is comfortable with treatment plan.  The Opioid medication pill count was appropriate. No reported significant side effects of Opioid medications noted. No aberrant behavior noted. Patient cautioned regarding operation of machinery or vehicles. Patient understands risk and benefits of these medications. There is no indication or report of risk to self or others.  [place] Controlled Substance Reporting accessed 11/03/2012 showed no evidence of aberrent narcotic use   F/u in one month

## 2013-03-11 NOTE — Patient Instructions (Signed)
I have refilled your medication  These make sure you keep your pain medications locked up and in a secure location  F/u one month

## 2013-04-08 ENCOUNTER — Encounter: Payer: Self-pay | Admitting: Physical Medicine and Rehabilitation

## 2013-04-08 ENCOUNTER — Encounter: Payer: Self-pay | Attending: Neurosurgery | Admitting: Physical Medicine and Rehabilitation

## 2013-04-08 VITALS — BP 143/75 | HR 74 | Resp 16 | Ht 65.0 in | Wt 198.0 lb

## 2013-04-08 DIAGNOSIS — M545 Low back pain, unspecified: Secondary | ICD-10-CM | POA: Insufficient documentation

## 2013-04-08 DIAGNOSIS — M25571 Pain in right ankle and joints of right foot: Secondary | ICD-10-CM

## 2013-04-08 DIAGNOSIS — M25579 Pain in unspecified ankle and joints of unspecified foot: Secondary | ICD-10-CM

## 2013-04-08 DIAGNOSIS — Z760 Encounter for issue of repeat prescription: Secondary | ICD-10-CM

## 2013-04-08 DIAGNOSIS — M1710 Unilateral primary osteoarthritis, unspecified knee: Secondary | ICD-10-CM

## 2013-04-08 DIAGNOSIS — G8929 Other chronic pain: Secondary | ICD-10-CM | POA: Insufficient documentation

## 2013-04-08 DIAGNOSIS — M549 Dorsalgia, unspecified: Secondary | ICD-10-CM

## 2013-04-08 DIAGNOSIS — M25562 Pain in left knee: Secondary | ICD-10-CM

## 2013-04-08 DIAGNOSIS — M25569 Pain in unspecified knee: Secondary | ICD-10-CM

## 2013-04-08 DIAGNOSIS — IMO0002 Reserved for concepts with insufficient information to code with codable children: Secondary | ICD-10-CM | POA: Insufficient documentation

## 2013-04-08 DIAGNOSIS — Z5181 Encounter for therapeutic drug level monitoring: Secondary | ICD-10-CM | POA: Insufficient documentation

## 2013-04-08 DIAGNOSIS — Z79899 Other long term (current) drug therapy: Secondary | ICD-10-CM | POA: Insufficient documentation

## 2013-04-08 MED ORDER — MORPHINE SULFATE 15 MG PO TABS: 15.0000 mg | ORAL_TABLET | Freq: Three times a day (TID) | ORAL | Status: DC | PRN

## 2013-04-08 NOTE — Patient Instructions (Addendum)
Please make sure you keep your pain medications locked up and in a secure location.  Follow up one month

## 2013-04-08 NOTE — Progress Notes (Signed)
Subjective:    Patient ID: Alyssa [name], female    DOB: 06-Feb-1956, 57 y.o.   MRN: [id number]  HPI   Alyssa [name] is a 57 year old woman who is seen for multiple chronic pain complaints.  She has a long history of low back pain status post L3-S1 fusion back in May 2004.  She is here for a refill of her morphine 15 mg 3 times a day.  She has bilateral knee osteoarthritis, chronic ankle pain  status post remote fracture, intermittent cervicalgia, history of carpal  tunnel syndrome in the past and bilateral trochanteric bursitis.  Her average pain is between a 4 and 5 with medications.  There are no changes in her pain. She is here for a refill of her pain medication.   She states that she has to pay $800.00 for a UDS.  She is thinking about trying to get off of morphine but is worried that she may not be able to function without it.  Pain Inventory Average Pain 4 Pain Right Now 4 My pain is constant, sharp, burning, tingling and aching  In the last 24 hours, has pain interfered with the following? General activity 4 Relation with others 4 Enjoyment of life 1 What TIME of day is your pain at its worst? evening Sleep (in general) Poor  Pain is worse with: walking and standing Pain improves with: heat/ice Relief from Meds: 6  Mobility walk without assistance how many minutes can you walk? 5 min ability to climb steps?  no do you drive?  yes Do you have any goals in this area?  yes  Function I need assistance with the following:  household duties and shopping  Neuro/Psych numbness tingling spasms anxiety  Prior Studies Any changes since last visit?  no  Physicians involved in your care Any changes since last visit?  no   Family History  Problem Relation Age of Onset  . Stroke Mother   . Diabetes Mother   . Diabetes Father    History   Social History  . Marital Status: Married    Spouse Name: N/A    Number of Children: N/A  . Years of Education:  N/A   Social History Main Topics  . Smoking status: Current Every Day Smoker -- 1.00 packs/day  . Smokeless tobacco: Never Used  . Alcohol Use: None  . Drug Use: None  . Sexually Active: None   Other Topics Concern  . None   Social History Narrative  . None   Past Surgical History  Procedure Laterality Date  . Abdominal hysterectomy     Past Medical History  Diagnosis Date  . Cervicalgia   . Pain in limb   . Lumbago   . Primary localized osteoarthrosis, lower leg   . Enthesopathy of hip region    BP 143/75  Pulse 74  Resp 16  Ht 5\' 5"  (1.651 m)  Wt 198 lb (89.812 kg)  BMI 32.95 kg/m2  SpO2 96%    Review of Systems  Constitutional: Positive for unexpected weight change.  Gastrointestinal: Positive for constipation.  Neurological: Positive for numbness.  Psychiatric/Behavioral: Positive for agitation.  All other systems reviewed and are negative.       Objective:   Physical Exam  Patient is a well-developed well-nourished woman in no apparent distress. She is her affect is overall bright cooperative pleasant follows commands without difficulty answers questions appropriately.  Cranial nerves and coordination are grossly intact  Reflexes are present but diminished in both  lower extremities  Straight leg raise is negative  Motor strength is good in both lower extremities no new sensory changes are noted  Slightly decreased range of motion of right ankle  Crepitus noted at both knees without effusion full range of motion noted at both knees  Transitions without difficulty from sit to stand  Normal gait  Heel toe walking performed adequately tandem gait performed adequately  Limited lumbar motion in all planes  No hip pain or groin pain with internal or external rotation at hips         Assessment & Plan:  1. Lumbago status post 3 spinal surgeries outlined in previous notes. Stable      Last surgery was done May 2004, L3-S1 fusion with pedicle screws.   2. Medication refill/monitering: noted below.  3. Bilateral knee osteoarthritis. She reports the knees overall are improved would like to defer injection at this time.Patellofemoral Pain noted today unchanged and stable  4. Right Chronic ankle pain status post remote fracture. Stable  5. Intermittent cervicalgia. Stable  6. Intermittent neuropathic lower extremity pain. Stable  7. History of carpal tunnel syndrome. Stable  8. Trochanteric bursitis improved  9. Stressful home environment: recommend counseling   I have suggested counseling to help her deal with her home environment. I think she would benefit but she defers this currently. She states finances and time are the barriers.  She denies HI/SI    Plan:  The patient expressed concern regarding the cost of her urine drug screens today. She is contemplating trying to discontinue her morphine however she is very worried that she will not be able to function without it.  She also states clinic visits are costly.  We discussed weaning and switching to tramadol.  She is not ready to try to switch medications at this time.  Encourage to exercise and stay active.  Continue to use MSContin for chronic low back pain.  Uses miralex and dulcolax for constipation, this is not a problem currently.  Ms. Overturf has limited financial resources. Her pain complaints are stable. At this time, she  prefers just to have her pain medication refilled, morphine sulfate IR  15 mg 1 p.o. t.i.d. #90.  Her last urine drug screen was:  11.04.13  consistant  I have answered all her  questions. She is comfortable with treatment plan.  The Opioid medication pill count was appropriate. No reported significant side effects of Opioid medications noted. No aberrant behavior noted. Patient cautioned regarding operation of machinery or vehicles. Patient understands risk and benefits of these medications. There is no indication or report of risk to self or others.   [place] Controlled Substance Reporting accessed 11/03/2012 showed no evidence of aberrent narcotic use  F/u in one month

## 2013-05-06 ENCOUNTER — Encounter: Payer: Self-pay | Attending: Neurosurgery | Admitting: Physical Medicine and Rehabilitation

## 2013-05-06 ENCOUNTER — Encounter: Payer: Self-pay | Admitting: Physical Medicine and Rehabilitation

## 2013-05-06 VITALS — BP 129/74 | HR 78 | Resp 14 | Ht 65.0 in | Wt 197.8 lb

## 2013-05-06 DIAGNOSIS — M542 Cervicalgia: Secondary | ICD-10-CM | POA: Insufficient documentation

## 2013-05-06 DIAGNOSIS — M25569 Pain in unspecified knee: Secondary | ICD-10-CM

## 2013-05-06 DIAGNOSIS — M25579 Pain in unspecified ankle and joints of unspecified foot: Secondary | ICD-10-CM | POA: Insufficient documentation

## 2013-05-06 DIAGNOSIS — E669 Obesity, unspecified: Secondary | ICD-10-CM | POA: Insufficient documentation

## 2013-05-06 DIAGNOSIS — Z5181 Encounter for therapeutic drug level monitoring: Secondary | ICD-10-CM

## 2013-05-06 DIAGNOSIS — M79609 Pain in unspecified limb: Secondary | ICD-10-CM | POA: Insufficient documentation

## 2013-05-06 DIAGNOSIS — M545 Low back pain, unspecified: Secondary | ICD-10-CM | POA: Insufficient documentation

## 2013-05-06 DIAGNOSIS — M1710 Unilateral primary osteoarthritis, unspecified knee: Secondary | ICD-10-CM | POA: Insufficient documentation

## 2013-05-06 DIAGNOSIS — G8929 Other chronic pain: Secondary | ICD-10-CM | POA: Insufficient documentation

## 2013-05-06 DIAGNOSIS — Z760 Encounter for issue of repeat prescription: Secondary | ICD-10-CM

## 2013-05-06 MED ORDER — MORPHINE SULFATE 15 MG PO TABS
15.0000 mg | ORAL_TABLET | Freq: Three times a day (TID) | ORAL | Status: DC | PRN
Start: 2013-05-06 — End: 2013-06-03

## 2013-05-06 NOTE — Patient Instructions (Signed)
Take your pain medications as prescribed.  Your pain medications locked up and in a secure location  Followup in one month

## 2013-05-06 NOTE — Progress Notes (Signed)
Subjective:    Patient ID: Alyssa [name], female    DOB: 12/06/1956, 57 y.o.   MRN: [id number]  HPI  Alyssa [name] is a 57 year old woman who is seen for multiple chronic pain complaints.  She has a long history of low back pain status post L3-S1 fusion back in May 2004.(Dr. Dutch Quint) She had 2 other spine surgeries prior to her fusion.   She is here for a refill of her morphine 15 mg 3 times a day.  She has bilateral knee osteoarthritis, chronic ankle pain  status post remote fracture, intermittent cervicalgia, history of carpal  tunnel syndrome in the past and bilateral trochanteric bursitis.  Her average pain is between a 4 and 5 with medications.   There are no changes in her pain. She is here for a refill of her pain medication.    Pain Inventory Average Pain 4 Pain Right Now 4 My pain is sharp, burning, tingling and aching  In the last 24 hours, has pain interfered with the following? General activity 4 Relation with others 4 Enjoyment of life 1 What TIME of day is your pain at its worst? evening Sleep (in general) Poor  Pain is worse with: walking and standing Pain improves with: heat/ice Relief from Meds: 6  Mobility walk without assistance how many minutes can you walk? 5 ability to climb steps?  yes do you drive?  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  no  Physicians involved in your care Any changes since last visit?  no   Family History  Problem Relation Age of Onset  . Stroke Mother   . Diabetes Mother   . Diabetes Father    History   Social History  . Marital Status: Married    Spouse Name: N/A    Number of Children: N/A  . Years of Education: N/A   Social History Main Topics  . Smoking status: Current Every Day Smoker -- 1.00 packs/day  .  Smokeless tobacco: Never Used  . Alcohol Use: None  . Drug Use: None  . Sexually Active: None   Other Topics Concern  . None   Social History Narrative  . None   Past Surgical History  Procedure Laterality Date  . Abdominal hysterectomy     Past Medical History  Diagnosis Date  . Cervicalgia   . Pain in limb   . Lumbago   . Primary localized osteoarthrosis, lower leg   . Enthesopathy of hip region    BP 129/74  Pulse 78  Resp 14  Ht 5\' 5"  (1.651 m)  Wt 197 lb 12.8 oz (89.721 kg)  BMI 32.92 kg/m2  SpO2 97%   Review of Systems  Constitutional: Positive for unexpected weight change.  Gastrointestinal: Positive for constipation.  Musculoskeletal:       Spasms  Neurological: Positive for numbness.       Tingling  Psychiatric/Behavioral: The patient is nervous/anxious.   All other systems reviewed and are negative.       Objective:   Physical Exam Patient is a well-developed well-nourished woman in no apparent distress. She is her affect is overall bright cooperative pleasant follows commands without difficulty answers questions appropriately.  Cranial nerves and coordination are grossly intact  Reflexes are present but diminished in both lower extremities  Straight leg raise is negative  Motor  strength is good in both lower extremities no new sensory changes are noted  Slightly decreased range of motion of right ankle  Crepitus noted at both knees without effusion full range of motion noted at both knees  Transitions without difficulty from sit to stand  Slightly uneven gait.  Heel toe walking performed adequately tandem gait performed adequately  Limited lumbar motion in all planes   No hip pain or groin pain with internal or external rotation at hips         Assessment & Plan:  1. Lumbago status post 3 spinal surgeries outlined in previous notes. Stable      Last surgery was done May 2004, L3-S1 fusion with pedicle screws. (Dr. Dutch Quint)  2. Medication  refill/monitering: noted below.  3. Bilateral knee osteoarthritis. She reports the knees overall are improved would like to defer injection at this time.Patellofemoral Pain noted today unchanged and stable  4. Right Chronic ankle pain status post remote fracture. Stable  5. Intermittent cervicalgia. Stable  6. Intermittent neuropathic lower extremity pain. Stable  7. History of carpal tunnel syndrome. Stable  8. Trochanteric bursitis improved  9. Stressful home environment: recommend counseling  I have suggested counseling to help her deal with her home environment. I think she would benefit but she defers this currently. She states finances and time are the barriers.  She denies HI/SI   Plan:  Encourage to exercise and stay active.  Continue to use MSContin for chronic low back pain.  Uses miralex and dulcolax for constipation, this is not a problem currently.  Ms. Risenhoover has limited financial resources. Her pain complaints are stable. At this time, she  prefers just to have her pain medication refilled, morphine sulfate IR  15 mg 1 p.o. t.i.d. #90.   Her last urine drug screen was:  11.04.13  consistant   I have answered all her  questions. She is comfortable with treatment plan.  The Opioid medication pill count was appropriate. No reported significant side effects of Opioid medications noted. No aberrant behavior noted. Patient cautioned regarding operation of machinery or vehicles. Patient understands risk and benefits of these medications. There is no indication or report of risk to self or others.   [place] Cont[place] Substance Reporting accessed 05/07/2012 showed no evidence of aberrent narcotic use   F/u in one month

## 2013-06-03 ENCOUNTER — Ambulatory Visit: Payer: Self-pay | Admitting: Physical Medicine and Rehabilitation

## 2013-06-03 ENCOUNTER — Encounter: Payer: Self-pay | Admitting: Physical Medicine and Rehabilitation

## 2013-06-03 ENCOUNTER — Encounter
Payer: No Typology Code available for payment source | Attending: Physical Medicine and Rehabilitation | Admitting: Physical Medicine and Rehabilitation

## 2013-06-03 VITALS — BP 125/72 | HR 71 | Resp 14 | Ht 65.0 in | Wt 198.0 lb

## 2013-06-03 DIAGNOSIS — G5600 Carpal tunnel syndrome, unspecified upper limb: Secondary | ICD-10-CM | POA: Insufficient documentation

## 2013-06-03 DIAGNOSIS — M542 Cervicalgia: Secondary | ICD-10-CM | POA: Insufficient documentation

## 2013-06-03 DIAGNOSIS — M25569 Pain in unspecified knee: Secondary | ICD-10-CM | POA: Insufficient documentation

## 2013-06-03 DIAGNOSIS — M961 Postlaminectomy syndrome, not elsewhere classified: Secondary | ICD-10-CM

## 2013-06-03 DIAGNOSIS — M25561 Pain in right knee: Secondary | ICD-10-CM

## 2013-06-03 DIAGNOSIS — Z981 Arthrodesis status: Secondary | ICD-10-CM | POA: Insufficient documentation

## 2013-06-03 DIAGNOSIS — M25579 Pain in unspecified ankle and joints of unspecified foot: Secondary | ICD-10-CM | POA: Insufficient documentation

## 2013-06-03 DIAGNOSIS — M545 Low back pain, unspecified: Secondary | ICD-10-CM | POA: Insufficient documentation

## 2013-06-03 DIAGNOSIS — G8929 Other chronic pain: Secondary | ICD-10-CM | POA: Insufficient documentation

## 2013-06-03 DIAGNOSIS — M79609 Pain in unspecified limb: Secondary | ICD-10-CM | POA: Insufficient documentation

## 2013-06-03 MED ORDER — MORPHINE SULFATE 15 MG PO TABS: 15.0000 mg | ORAL_TABLET | Freq: Three times a day (TID) | ORAL | Status: DC | PRN

## 2013-06-03 NOTE — Progress Notes (Signed)
Subjective:    Patient ID: Alyssa [name], female    DOB: [birth year], 57 y.o.   MRN: [id number]  HPI The patient complains about chronic knee pain, worse on the right knee . The patient also complains about chronic low back pain, Hx of PSF L3-S1, last surgery 2004, and numbness and tingling in both feet.  The problem has been stable.The patient states that the medication is controlling  her symptoms most of the time.   Pain Inventory Average Pain 4 Pain Right Now 4 My pain is sharp, burning, tingling and aching  In the last 24 hours, has pain interfered with the following? General activity 4 Relation with others 4 Enjoyment of life 1 What TIME of day is your pain at its worst? evening Sleep (in general) Poor  Pain is worse with: walking and standing Pain improves with: heat/ice Relief from Meds: 6  Mobility walk without assistance how many minutes can you walk? 5 ability to climb steps?  no do you drive?  yes Do you have any goals in this area?  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  no  Physicians involved in your care Primary care .   Family History  Problem Relation Age of Onset  . Stroke Mother   . Diabetes Mother   . Diabetes Father    History   Social History  . Marital Status: Married    Spouse Name: N/A    Number of Children: N/A  . Years of Education: N/A   Social History Main Topics  . Smoking status: Current Every Day Smoker -- 1.00 packs/day  . Smokeless tobacco: Never Used  . Alcohol Use: None  . Drug Use: None  . Sexually Active: None   Other Topics Concern  . None   Social History Narrative  . None   Past Surgical History  Procedure Laterality Date  . Abdominal hysterectomy     Past Medical History  Diagnosis Date  . Cervicalgia   . Pain in limb    . Lumbago   . Primary localized osteoarthrosis, lower leg   . Enthesopathy of hip region    BP 125/72  Pulse 71  Resp 14  Ht 5\' 5"  (1.651 m)  Wt 198 lb (89.812 kg)  BMI 32.95 kg/m2  SpO2 99%     Review of Systems  Constitutional: Positive for unexpected weight change.  Gastrointestinal: Positive for constipation.  Neurological: Positive for numbness.  Psychiatric/Behavioral: The patient is nervous/anxious.   All other systems reviewed and are negative.       Objective:   Physical Exam Constitutional: She is oriented to person, place, and time. She appears well-developed and well-nourished.  HENT:  Head: Normocephalic.  Musculoskeletal: She exhibits tenderness.  Neurological: She is alert and oriented to person, place, and time.  Skin: Skin is warm and dry.  Psychiatric: She has a normal mood and affect.  Symmetric normal motor tone is noted throughout. Normal muscle bulk. Muscle testing reveals 5/5 muscle strength of the upper extremity, and 5/5 of the lower extremity. Full range of motion in upper and lower extremities. ROM of spine is restricted. Fine motor movements are normal in both hands.  Sensory is intact and symmetric to light touch, pinprick and proprioception.  DTR in the upper and lower extremity are present and symmetric 2+. No clonus is noted.  Patient arises from chair with mild difficulty.          Assessment & Plan:  This is a 57 year old female with  1. Chronic low back pain , Hx of PSF L3-S1 2004  2. . Bilateral knee pain, most likely osteoarthritis, right worse than left. Did not see a recent x-ray, patient has no insurance, and therefore did not order a new x-ray at this point. She reports the knees overall are stable .  3. Right Chronic ankle pain status post remote fracture. Stable  4. Intermittent cervicalgia. Stable  5. Intermittent neuropathic lower extremity pain. Stable  6. History of carpal tunnel syndrome. Stable   Stressful home  environment: recommend counseling  I have suggested counseling to help her deal with her home environment. I think she would benefit but she defers this currently. She states finances and time are the barriers   Plan : Continue with medication , continue with riding the stationary bike regularly.  Patient was exhausted today, she is taking care of her mother and of her disabled child, Advised the patient to think about her own health, and somehow try to change the current situation. Encouraged exercising and/or walking, for relaxation and strengthening. Medication was refilled today,morphine MSIR 15mg  tid # 90, medication was due, pill count appropriate.  Follow up in 1 month with Dr. Doroteo Bradford

## 2013-06-03 NOTE — Patient Instructions (Signed)
Stay as active as tolerated. 

## 2013-07-06 ENCOUNTER — Ambulatory Visit: Payer: Self-pay | Admitting: Physical Medicine and Rehabilitation

## 2013-07-06 ENCOUNTER — Ambulatory Visit: Payer: Self-pay | Admitting: Physical Medicine & Rehabilitation

## 2013-07-07 ENCOUNTER — Ambulatory Visit (HOSPITAL_BASED_OUTPATIENT_CLINIC_OR_DEPARTMENT_OTHER): Payer: No Typology Code available for payment source | Admitting: Physical Medicine & Rehabilitation

## 2013-07-07 ENCOUNTER — Encounter: Payer: Self-pay | Admitting: Physical Medicine & Rehabilitation

## 2013-07-07 ENCOUNTER — Encounter: Payer: No Typology Code available for payment source | Attending: Neurosurgery

## 2013-07-07 VITALS — BP 112/71 | HR 76 | Resp 14 | Ht 65.0 in | Wt 195.0 lb

## 2013-07-07 DIAGNOSIS — M1710 Unilateral primary osteoarthritis, unspecified knee: Secondary | ICD-10-CM | POA: Insufficient documentation

## 2013-07-07 DIAGNOSIS — M545 Low back pain, unspecified: Secondary | ICD-10-CM

## 2013-07-07 DIAGNOSIS — E669 Obesity, unspecified: Secondary | ICD-10-CM | POA: Insufficient documentation

## 2013-07-07 DIAGNOSIS — M7061 Trochanteric bursitis, right hip: Secondary | ICD-10-CM

## 2013-07-07 DIAGNOSIS — M542 Cervicalgia: Secondary | ICD-10-CM | POA: Insufficient documentation

## 2013-07-07 DIAGNOSIS — M25579 Pain in unspecified ankle and joints of unspecified foot: Secondary | ICD-10-CM | POA: Insufficient documentation

## 2013-07-07 DIAGNOSIS — G8929 Other chronic pain: Secondary | ICD-10-CM

## 2013-07-07 DIAGNOSIS — M79609 Pain in unspecified limb: Secondary | ICD-10-CM | POA: Insufficient documentation

## 2013-07-07 DIAGNOSIS — M961 Postlaminectomy syndrome, not elsewhere classified: Secondary | ICD-10-CM

## 2013-07-07 DIAGNOSIS — M76899 Other specified enthesopathies of unspecified lower limb, excluding foot: Secondary | ICD-10-CM

## 2013-07-07 MED ORDER — MORPHINE SULFATE 15 MG PO TABS: 15.0000 mg | ORAL_TABLET | Freq: Three times a day (TID) | ORAL | Status: DC | PRN

## 2013-07-07 NOTE — Progress Notes (Signed)
Subjective:    Patient ID: Alyssa [name], female    DOB: 1956/02/11, 57 y.o.   MRN: [id number]  HPI Alyssa [name] is a 57 year old woman who is seen for multiple chronic pain complaints.  She has a long history of low back pain status post L3-S1 fusion back in  May 2004.   Cares for disabled daughter and  Mother,sister needs help as well No exercises other that ADLs Pain Inventory Average Pain 4 Pain Right Now 4 My pain is sharp, burning, tingling and aching  In the last 24 hours, has pain interfered with the following? General activity 4 Relation with others 4 Enjoyment of life 1 What TIME of day is your pain at its worst? evening Sleep (in general) Poor  Pain is worse with: walking and standing Pain improves with: heat/ice Relief from Meds: 6  Mobility walk without assistance how many minutes can you walk? 5 ability to climb steps?  no do you drive?  yes Do you have any goals in this area?  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  no  Physicians involved in your care Any changes since last visit?  no   Family History  Problem Relation Age of Onset  . Stroke Mother   . Diabetes Mother   . Diabetes Father    History   Social History  . Marital Status: Married    Spouse Name: N/A    Number of Children: N/A  . Years of Education: N/A   Social History Main Topics  . Smoking status: Current Every Day Smoker -- 1.00 packs/day  . Smokeless tobacco: Never Used  . Alcohol Use: None  . Drug Use: None  . Sexually Active: None   Other Topics Concern  . None   Social History Narrative  . None   Past Surgical History  Procedure Laterality Date  . Abdominal hysterectomy     Past Medical History  Diagnosis Date  . Cervicalgia   . Pain in limb   . Lumbago   . Primary localized  osteoarthrosis, lower leg   . Enthesopathy of hip region    BP 112/71  Pulse 76  Resp 14  Ht 5\' 5"  (1.651 m)  Wt 195 lb (88.451 kg)  BMI 32.45 kg/m2  SpO2 97%     Review of Systems  Constitutional: Positive for unexpected weight change.  Gastrointestinal: Positive for constipation.  Musculoskeletal: Positive for back pain.  Neurological: Positive for numbness.  All other systems reviewed and are negative.       Objective:   Physical Exam  Nursing note and vitals reviewed. Constitutional: She appears well-developed and well-nourished.  HENT:  Head: Normocephalic and atraumatic.  Eyes: Conjunctivae and EOM are normal. Pupils are equal, round, and reactive to light.  Neck: Normal range of motion.  Musculoskeletal:       Right knee: Normal.       Left knee: Normal.       Lumbar back: She exhibits decreased range of motion and deformity. She exhibits no tenderness and no bony tenderness.  Negative straight leg raising test No pain with hip range of motion Positive tenderness over the trochanter  on the right side Knee crepitus without pain No knee effusion   Psychiatric: She has a normal mood and affect.          Assessment &  Plan:  1. 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 She also takes pregabalin prescribed by her primary physician 2.  Knee pain with crepitus but otherwise normal examination.  Monitor, consider x-rays if this worsens

## 2013-08-06 ENCOUNTER — Ambulatory Visit (HOSPITAL_BASED_OUTPATIENT_CLINIC_OR_DEPARTMENT_OTHER): Payer: No Typology Code available for payment source | Admitting: Physical Medicine & Rehabilitation

## 2013-08-06 ENCOUNTER — Encounter: Payer: Self-pay | Admitting: Physical Medicine & Rehabilitation

## 2013-08-06 ENCOUNTER — Encounter: Payer: No Typology Code available for payment source | Attending: Neurosurgery

## 2013-08-06 VITALS — BP 118/69 | HR 79 | Resp 14 | Ht 65.0 in | Wt 196.6 lb

## 2013-08-06 DIAGNOSIS — E669 Obesity, unspecified: Secondary | ICD-10-CM | POA: Insufficient documentation

## 2013-08-06 DIAGNOSIS — M25569 Pain in unspecified knee: Secondary | ICD-10-CM

## 2013-08-06 DIAGNOSIS — G8929 Other chronic pain: Secondary | ICD-10-CM | POA: Insufficient documentation

## 2013-08-06 DIAGNOSIS — M25579 Pain in unspecified ankle and joints of unspecified foot: Secondary | ICD-10-CM | POA: Insufficient documentation

## 2013-08-06 DIAGNOSIS — M7061 Trochanteric bursitis, right hip: Secondary | ICD-10-CM

## 2013-08-06 DIAGNOSIS — M545 Low back pain, unspecified: Secondary | ICD-10-CM | POA: Insufficient documentation

## 2013-08-06 DIAGNOSIS — M76899 Other specified enthesopathies of unspecified lower limb, excluding foot: Secondary | ICD-10-CM

## 2013-08-06 DIAGNOSIS — M542 Cervicalgia: Secondary | ICD-10-CM | POA: Insufficient documentation

## 2013-08-06 DIAGNOSIS — M79609 Pain in unspecified limb: Secondary | ICD-10-CM | POA: Insufficient documentation

## 2013-08-06 DIAGNOSIS — M1710 Unilateral primary osteoarthritis, unspecified knee: Secondary | ICD-10-CM | POA: Insufficient documentation

## 2013-08-06 MED ORDER — MORPHINE SULFATE 15 MG PO TABS: 15.0000 mg | ORAL_TABLET | Freq: Three times a day (TID) | ORAL | Status: DC | PRN

## 2013-08-06 NOTE — Patient Instructions (Addendum)
Trochanteric Bursitis You have hip pain due to trochanteric bursitis. Bursitis means that the sack near the outside of the hip is filled with fluid and inflamed. This sack is made up of protective soft tissue. The pain from trochanteric bursitis can be severe and keep you from sleep. It can radiate to the buttocks or down the outside of the thigh to the knee. The pain is almost always worse when rising from the seated or lying position and with walking. Pain can improve after you take a few steps. It happens more often in people with hip joint and lumbar spine problems, such as arthritis or previous surgery. Very rarely the trochanteric bursa can become infected, and antibiotics and/or surgery may be needed. Treatment often includes an injection of local anesthetic mixed with cortisone medicine. This medicine is injected into the area where it is most tender over the hip. Repeat injections may be necessary if the response to treatment is slow. You can apply ice packs over the tender area for 30 minutes every 2 hours for the next few days. Anti-inflammatory and/or narcotic pain medicine may also be helpful. Limit your activity for the next few days if the pain continues. See your caregiver in 5-10 days if you are not greatly improved.  SEEK IMMEDIATE MEDICAL CARE IF:  You develop severe pain, fever, or increased redness.  You have pain that radiates below the knee. EXERCISES STRETCHING EXERCISES - Trochantic Bursitis  These exercises may help you when beginning to rehabilitate your injury. Your symptoms may resolve with or without further involvement from your physician, physical therapist or athletic trainer. While completing these exercises, remember:   Restoring tissue flexibility helps normal motion to return to the joints. This allows healthier, less painful movement and activity.  An effective stretch should be held for at least 30 seconds.  A stretch should never be painful. You should only  feel a gentle lengthening or release in the stretched tissue. STRETCH  Iliotibial Band  On the floor or bed, lie on your side so your injured leg is on top. Bend your knee and grab your ankle.  Slowly bring your knee back so that your thigh is in line with your trunk. Keep your heel at your buttocks and gently arch your back so your head, shoulders and hips line up.  Slowly lower your leg so that your knee approaches the floor/bed until you feel a gentle stretch on the outside of your thigh. If you do not feel a stretch and your knee will not fall farther, place the heel of your opposite foot on top of your knee and pull your thigh down farther.  Hold this stretch for __________ seconds.  Repeat __________ times. Complete this exercise __________ times per day. STRETCH Hamstrings, Supine   Lie on your back. Loop a belt or towel over the ball of your foot as shown.  Straighten your knee and slowly pull on the belt to raise your injured leg. Do not allow the knee to bend. Keep your opposite leg flat on the floor.  Raise the leg until you feel a gentle stretch behind your knee or thigh. Hold this position for ______10____ seconds.  Repeat ____2______ times. Complete this stretch ______2____ times per day. STRETCH - Quadriceps, Prone   Lie on your stomach on a firm surface, such as a bed or padded floor.  Bend your knee and grasp your ankle. If you are unable to reach, your ankle or pant leg, use a belt around  your foot to lengthen your reach.  Gently pull your heel toward your buttocks. Your knee should not slide out to the side. You should feel a stretch in the front of your thigh and/or knee.  Hold this position for ___10_______ seconds.  Repeat _______2___ times. Complete this stretch _______1___ times per day. STRETCHING - Hip Flexors, Lunge Half kneel with your knee on the floor and your opposite knee bent and directly over your ankle.  Keep good posture with your head over your  shoulders. Tighten your buttocks to point your tailbone downward; this will prevent your back from arching too much.  You should feel a gentle stretch in the front of your thigh and/or hip. If you do not feel any resistance, slightly slide your opposite foot forward and then slowly lunge forward so your knee once again lines up over your ankle. Be sure your tailbone remains pointed downward.  Hold this stretch for _____10_____ seconds.  Repeat _____2_____ times. Complete this stretch _______2___ times per day. STRETCH - Adductors, Lunge  While standing, spread your legs  Lean away from your injured leg by bending your opposite knee. You may rest your hands on your thigh for balance.  You should feel a stretch in your inner thigh. Hold for ____10______ seconds.  Repeat _______2___ times. Complete this exercise __2________ times per day. Document Released: 01/24/2005 Document Revised: 03/10/2012 Document Reviewed: 03/31/2009 [facility] Patient Information 2014 [place], [place].

## 2013-08-06 NOTE — Progress Notes (Signed)
Subjective:    Patient ID: Alyssa [name], female    DOB: Jun 16, 1956, 57 y.o.   MRN: [id number]  HPI Alyssa [name] is a 57 year old woman who is seen for multiple chronic pain complaints.  She has a long history of low back pain status post L3-S1 fusion back in  May 2004.  Cares for disabled daughter and Mother,sister needs help as well  No exercises other that ADLs  Pain Inventory Average Pain 4 Pain Right Now 4 My pain is constant, sharp, burning, tingling and aching  In the last 24 hours, has pain interfered with the following? General activity 4 Relation with others 4 Enjoyment of life 1 What TIME of day is your pain at its worst? evening Sleep (in general) Poor  Pain is worse with: walking and standing Pain improves with: therapy/exercise and medication Relief from Meds: 6  Mobility walk without assistance ability to climb steps?  no do you drive?  yes  Function not employed: date last employed . I need assistance with the following:  household duties and shopping  Neuro/Psych numbness tingling spasms anxiety  Prior Studies Any changes since last visit?  no  Physicians involved in your care Any changes since last visit?  no   Family History  Problem Relation Age of Onset  . Stroke Mother   . Diabetes Mother   . Diabetes Father    History   Social History  . Marital Status: Married    Spouse Name: N/A    Number of Children: N/A  . Years of Education: N/A   Social History Main Topics  . Smoking status: Current Every Day Smoker -- 1.00 packs/day  . Smokeless tobacco: Never Used  . Alcohol Use: None  . Drug Use: None  . Sexually Active: None   Other Topics Concern  . None   Social History Narrative  . None   Past Surgical History  Procedure Laterality Date  . Abdominal hysterectomy     Past Medical History  Diagnosis Date  . Cervicalgia   . Pain in limb   . Lumbago   . Primary localized osteoarthrosis, lower leg   . Enthesopathy  of hip region    BP 118/69  Pulse 79  Resp 14  Ht 5\' 5"  (1.651 m)  Wt 196 lb 9.6 oz (89.177 kg)  BMI 32.72 kg/m2  SpO2 95%    Review of Systems  Constitutional: Positive for unexpected weight change.  Cardiovascular:       Leg cramps  Gastrointestinal: Positive for constipation.  Musculoskeletal: Positive for myalgias and arthralgias.       Spasm  Neurological: Positive for numbness.       Tingling  Psychiatric/Behavioral: The patient is nervous/anxious.   All other systems reviewed and are negative.       Objective:   Physical Exam  Nursing note and vitals reviewed.  Constitutional: She appears well-developed and well-nourished.  HENT:  Head: Normocephalic and atraumatic.  Eyes: Conjunctivae and EOM are normal. Pupils are equal, round, and reactive to light.  Neck: Normal range of motion.  Musculoskeletal:  Right knee: Normal.  Left knee: Normal.  Lumbar back: She exhibits decreased range of motion and deformity. She exhibits no tenderness and no bony tenderness.  Negative straight leg raising test No pain with hip range of motion Positive tenderness over the trochanter on the right side Knee crepitus without pain No knee effusion  Psychiatric: She has a normal mood and affect.  Assessment & Plan:  1. 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 She also takes pregabalin prescribed by her primary physician  2. Knee pain with crepitus but otherwise normal examination. Monitor, consider x-rays if this worsens 3.  right hip trochanteric bursitis. May benefit from injection however patient is concerned about cost. We'll start out with hip stretching and strengthening exercises. RTC one month.

## 2013-09-08 ENCOUNTER — Ambulatory Visit (HOSPITAL_BASED_OUTPATIENT_CLINIC_OR_DEPARTMENT_OTHER): Payer: No Typology Code available for payment source | Admitting: Physical Medicine & Rehabilitation

## 2013-09-08 ENCOUNTER — Encounter: Payer: No Typology Code available for payment source | Attending: Neurosurgery

## 2013-09-08 ENCOUNTER — Encounter: Payer: Self-pay | Admitting: Physical Medicine & Rehabilitation

## 2013-09-08 VITALS — BP 119/75 | HR 76 | Resp 14 | Ht 65.0 in | Wt 196.4 lb

## 2013-09-08 DIAGNOSIS — G8929 Other chronic pain: Secondary | ICD-10-CM

## 2013-09-08 DIAGNOSIS — M542 Cervicalgia: Secondary | ICD-10-CM | POA: Insufficient documentation

## 2013-09-08 DIAGNOSIS — M25569 Pain in unspecified knee: Secondary | ICD-10-CM

## 2013-09-08 DIAGNOSIS — M545 Low back pain, unspecified: Secondary | ICD-10-CM | POA: Insufficient documentation

## 2013-09-08 DIAGNOSIS — E669 Obesity, unspecified: Secondary | ICD-10-CM | POA: Insufficient documentation

## 2013-09-08 DIAGNOSIS — M79609 Pain in unspecified limb: Secondary | ICD-10-CM | POA: Insufficient documentation

## 2013-09-08 DIAGNOSIS — M1710 Unilateral primary osteoarthritis, unspecified knee: Secondary | ICD-10-CM | POA: Insufficient documentation

## 2013-09-08 DIAGNOSIS — M25579 Pain in unspecified ankle and joints of unspecified foot: Secondary | ICD-10-CM | POA: Insufficient documentation

## 2013-09-08 DIAGNOSIS — M961 Postlaminectomy syndrome, not elsewhere classified: Secondary | ICD-10-CM

## 2013-09-08 MED ORDER — TRAMADOL HCL 50 MG PO TABS: 100.0000 mg | ORAL_TABLET | Freq: Three times a day (TID) | ORAL | Status: DC | PRN

## 2013-09-08 MED ORDER — MORPHINE SULFATE 15 MG PO TABS: 15.0000 mg | ORAL_TABLET | Freq: Three times a day (TID) | ORAL | Status: DC | PRN

## 2013-09-08 NOTE — Patient Instructions (Signed)
Substitute tramadol 2 tablets for 1 tablet of morphine.Do this for 3 days.  If your pain is doing well, then substitute another 2 tablets for another dose of morphine. If your pain is doing well then switch to tramadol 2 tablets 3 times per day

## 2013-09-08 NOTE — Progress Notes (Signed)
Subjective:    Patient ID: Alyssa [name], female    DOB: 1956/01/27, 57 y.o.   MRN: [id number]  HPI Alyssa [name] is a 57 year old woman who is seen for multiple chronic pain complaints.  She has a long history of low back pain status post L3-S1 fusion back in  May 2004.  Cares for disabled daughter and Mother,sister needs help as well  No exercises other that ADLs, prescribed stretching for hips  Pain Inventory Average Pain 4 Pain Right Now 4 My pain is sharp, burning, tingling, aching and numbness  In the last 24 hours, has pain interfered with the following? General activity 4 Relation with others 4 Enjoyment of life 1 What TIME of day is your pain at its worst? evening Sleep (in general) Poor  Pain is worse with: walking and standing Pain improves with: heat/ice and medication Relief from Meds: 6  Mobility walk without assistance how many minutes can you walk? 5 ability to climb steps?  no do you drive?  yes  Function I need assistance with the following:  household duties and shopping  Neuro/Psych numbness tingling spasms anxiety  Prior Studies Any changes since last visit?  no  Physicians involved in your care Any changes since last visit?  no   Family History  Problem Relation Age of Onset  . Stroke Mother   . Diabetes Mother   . Diabetes Father    History   Social History  . Marital Status: Married    Spouse Name: N/A    Number of Children: N/A  . Years of Education: N/A   Social History Main Topics  . Smoking status: Current Every Day Smoker -- 1.00 packs/day  . Smokeless tobacco: Never Used  . Alcohol Use: None  . Drug Use: None  . Sexual Activity: None   Other Topics Concern  . None   Social History Narrative  . None   Past Surgical History  Procedure Laterality Date  . Abdominal hysterectomy     Past Medical History  Diagnosis Date  . Cervicalgia   . Pain in limb   . Lumbago   . Primary localized osteoarthrosis, lower  leg   . Enthesopathy of hip region    BP 119/75  Pulse 76  Resp 14  Ht 5\' 5"  (1.651 m)  Wt 196 lb 6.4 oz (89.086 kg)  BMI 32.68 kg/m2  SpO2 97%    Review of Systems  Constitutional: Positive for unexpected weight change.  Gastrointestinal: Positive for constipation.  Musculoskeletal: Positive for back pain.       Legs cramping, spasms  Neurological: Positive for numbness.       Tingling  Psychiatric/Behavioral: The patient is nervous/anxious.   All other systems reviewed and are negative.       Objective:   Physical Exam  Nursing note and vitals reviewed. Constitutional: She is oriented to person, place, and time. She appears well-developed and well-nourished.  HENT:  Head: Normocephalic and atraumatic.  Eyes: Conjunctivae and EOM are normal. Pupils are equal, round, and reactive to light.  Neurological: She is alert and oriented to person, place, and time.  Psychiatric: She has a normal mood and affect.    Negative straight leg raising test No pain with hip range of motion no tenderness over the trochanter on the right side Knee crepitus without pain No knee effusion bilaterally Normal strength in the lower extremities      Assessment & Plan:  1. 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 She also takes pregabalin prescribed by her primary physician  The patient complains of expense for frequent visits needed to monitor opioid medication. We discussed other treatment alternatives including tramadol which does not require such intense monitoring. I've given her 180 tablets as a prescription. She can substitute 2 tablets of tramadol for one dose of morphine. If she does well with this substitution, she can substitute another 2 tablets of tramadol for a dose of morphine. If her pain does well, she can switch to 2 tablets of tramadol 3 times per day. Return to clinic one  month 2. Knee pain with crepitus but otherwise normal examination. This is doing better no x-rays for now Monitor, consider x-rays if this worsens  3. right hip trochanteric bursitis. Improved after hip stretching exercises.

## 2013-10-06 ENCOUNTER — Ambulatory Visit (HOSPITAL_BASED_OUTPATIENT_CLINIC_OR_DEPARTMENT_OTHER): Payer: No Typology Code available for payment source | Admitting: Physical Medicine & Rehabilitation

## 2013-10-06 ENCOUNTER — Encounter: Payer: No Typology Code available for payment source | Attending: Neurosurgery

## 2013-10-06 ENCOUNTER — Encounter: Payer: Self-pay | Admitting: Physical Medicine & Rehabilitation

## 2013-10-06 VITALS — BP 108/68 | HR 75 | Resp 14 | Ht 65.0 in | Wt 193.0 lb

## 2013-10-06 DIAGNOSIS — E669 Obesity, unspecified: Secondary | ICD-10-CM | POA: Insufficient documentation

## 2013-10-06 DIAGNOSIS — M545 Low back pain, unspecified: Secondary | ICD-10-CM | POA: Insufficient documentation

## 2013-10-06 DIAGNOSIS — G8929 Other chronic pain: Secondary | ICD-10-CM | POA: Insufficient documentation

## 2013-10-06 DIAGNOSIS — M79609 Pain in unspecified limb: Secondary | ICD-10-CM | POA: Insufficient documentation

## 2013-10-06 DIAGNOSIS — M961 Postlaminectomy syndrome, not elsewhere classified: Secondary | ICD-10-CM

## 2013-10-06 DIAGNOSIS — M1710 Unilateral primary osteoarthritis, unspecified knee: Secondary | ICD-10-CM | POA: Insufficient documentation

## 2013-10-06 DIAGNOSIS — M542 Cervicalgia: Secondary | ICD-10-CM | POA: Insufficient documentation

## 2013-10-06 DIAGNOSIS — M25579 Pain in unspecified ankle and joints of unspecified foot: Secondary | ICD-10-CM | POA: Insufficient documentation

## 2013-10-06 MED ORDER — TRAMADOL HCL 50 MG PO TABS: 100.0000 mg | ORAL_TABLET | Freq: Three times a day (TID) | ORAL | Status: DC | PRN

## 2013-10-06 NOTE — Patient Instructions (Signed)
Every  Month visit for tramadol

## 2013-10-06 NOTE — Progress Notes (Signed)
Subjective:    Patient ID: Alyssa [name], female    DOB: April 12, 1956, 57 y.o.   MRN: [id number] She has a long history of low back pain status post L3-S1 fusion back in  May 2004.   HPI Patient has weaned herself off of morphine sulfate immediate release 15 mg 3 times a day down to no morphine. Has been off morphine for 2 weeks. Has had diarrhea nausea sweats. Pain is fairly well controlled on tramadol 100 mg 3 times per day. Pain Inventory Average Pain 4 Pain Right Now 4 My pain is sharp, burning, tingling, aching and numb toes  In the last 24 hours, has pain interfered with the following? General activity 4 Relation with others 4 Enjoyment of life 1 What TIME of day is your pain at its worst? evening Sleep (in general) Poor  Pain is worse with: walking and standing Pain improves with: rest and heat/ice Relief from Meds: 6  Mobility walk without assistance how many minutes can you walk? 5 ability to climb steps?  no do you drive?  yes Do you have any goals in this area?  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  no  Physicians involved in your care Any changes since last visit?  no   Family History  Problem Relation Age of Onset  . Stroke Mother   . Diabetes Mother   . Diabetes Father    History   Social History  . Marital Status: Married    Spouse Name: N/A    Number of Children: N/A  . Years of Education: N/A   Social History Main Topics  . Smoking status: Current Every Day Smoker -- 1.00 packs/day  . Smokeless tobacco: Never Used  . Alcohol Use: None  . Drug Use: None  . Sexual Activity: None   Other Topics Concern  . None   Social History Narrative  . None   Past Surgical History  Procedure Laterality Date  . Abdominal hysterectomy     Past Medical History  Diagnosis Date   . Cervicalgia   . Pain in limb   . Lumbago   . Primary localized osteoarthrosis, lower leg   . Enthesopathy of hip region    BP 108/68  Pulse 75  Resp 14  Ht 5\' 5"  (1.651 m)  Wt 193 lb (87.544 kg)  BMI 32.12 kg/m2  SpO2 98%     Review of Systems  Constitutional: Positive for unexpected weight change.  Gastrointestinal: Positive for diarrhea.  Musculoskeletal: Positive for back pain.  Neurological: Positive for numbness.  All other systems reviewed and are negative.       Objective:   Physical Exam  Nursing note and vitals reviewed. Constitutional: She is oriented to person, place, and time. She appears well-developed and well-nourished.  HENT:  Head: Normocephalic and atraumatic.  Eyes: Conjunctivae and EOM are normal. Pupils are equal, round, and reactive to light.  Musculoskeletal:       Lumbar back: She exhibits decreased range of motion and tenderness. She exhibits no bony tenderness, no deformity and no spasm.  Neurological: She is alert and oriented to person, place, and time. She has normal strength and normal reflexes. No sensory deficit. Coordination and gait normal.  Psychiatric: She has a normal mood and affect.          Assessment & Plan:  1. A lumbar post laminectomy syndrome with chronic postoperative pain. The pain has remained fairly constant despite switching from morphine to tramadol. She will continue Tylenol 100 mg every 8 hours as needed. She'll be seen on every 6 month basis for routine monitoring. We can see her back sooner for any new issues. Discussed with patient agrees with plan. 5 refills on the tramadol given. No need for urine drug screen.

## 2013-11-02 ENCOUNTER — Ambulatory Visit: Payer: No Typology Code available for payment source | Admitting: Physical Medicine & Rehabilitation

## 2014-04-05 ENCOUNTER — Encounter: Payer: Self-pay | Admitting: Physical Medicine & Rehabilitation

## 2014-04-05 ENCOUNTER — Encounter: Payer: No Typology Code available for payment source | Attending: Physical Medicine & Rehabilitation

## 2014-04-05 ENCOUNTER — Ambulatory Visit (HOSPITAL_BASED_OUTPATIENT_CLINIC_OR_DEPARTMENT_OTHER): Payer: No Typology Code available for payment source | Admitting: Physical Medicine & Rehabilitation

## 2014-04-05 VITALS — BP 128/70 | HR 84 | Resp 14 | Ht 65.0 in | Wt 197.0 lb

## 2014-04-05 DIAGNOSIS — M1710 Unilateral primary osteoarthritis, unspecified knee: Secondary | ICD-10-CM | POA: Insufficient documentation

## 2014-04-05 DIAGNOSIS — M545 Low back pain, unspecified: Secondary | ICD-10-CM

## 2014-04-05 DIAGNOSIS — M25561 Pain in right knee: Secondary | ICD-10-CM

## 2014-04-05 DIAGNOSIS — G8929 Other chronic pain: Secondary | ICD-10-CM

## 2014-04-05 DIAGNOSIS — Z981 Arthrodesis status: Secondary | ICD-10-CM | POA: Insufficient documentation

## 2014-04-05 DIAGNOSIS — M961 Postlaminectomy syndrome, not elsewhere classified: Secondary | ICD-10-CM

## 2014-04-05 DIAGNOSIS — M25569 Pain in unspecified knee: Secondary | ICD-10-CM | POA: Insufficient documentation

## 2014-04-05 DIAGNOSIS — M25562 Pain in left knee: Secondary | ICD-10-CM

## 2014-04-05 DIAGNOSIS — M76899 Other specified enthesopathies of unspecified lower limb, excluding foot: Secondary | ICD-10-CM | POA: Insufficient documentation

## 2014-04-05 DIAGNOSIS — F17200 Nicotine dependence, unspecified, uncomplicated: Secondary | ICD-10-CM | POA: Insufficient documentation

## 2014-04-05 MED ORDER — TRAMADOL HCL 50 MG PO TABS: 100.0000 mg | ORAL_TABLET | Freq: Three times a day (TID) | ORAL | Status: DC | PRN

## 2014-04-05 NOTE — Progress Notes (Signed)
Subjective:    Patient ID: Alyssa [name], female    DOB: [birth year], 58 y.o.   MRN: [id number] She has a long history of low back pain status post L3-S1 fusion back in  May 2004.   HPI  Pain is fairly well controlled on tramadol 100 mg 3 times per day Still caring for mother who is in a wheelchair after a stroke No falls No regular exercise program but feels like she is very active taking care of her elderly parent Pain Inventory Average Pain 4 Pain Right Now 4 My pain is sharp, burning, tingling, aching and numbness  In the last 24 hours, has pain interfered with the following? General activity 4 Relation with others 4 Enjoyment of life 1 What TIME of day is your pain at its worst? evening Sleep (in general) Poor  Pain is worse with: some activites Pain improves with: heat/ice Relief from Meds: 6  Mobility walk without assistance how many minutes can you walk? 5 ability to climb steps?  no do you drive?  yes transfers alone Do you have any goals in this area?  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  yes  Neuro/Psych numbness tingling spasms anxiety  Prior Studies Any changes since last visit?  no  Physicians involved in your care Any changes since last visit?  no   Family History  Problem Relation Age of Onset  . Stroke Mother   . Diabetes Mother   . Diabetes Father    History   Social History  . Marital Status: Married    Spouse Name: N/A    Number of Children: N/A  . Years of Education: N/A   Social History Main Topics  . Smoking status: Current Every Day Smoker -- 1.00 packs/day  . Smokeless tobacco: Never Used  . Alcohol Use: None  . Drug Use: None  . Sexual Activity: None   Other Topics Concern  . None   Social History Narrative  . None   Past Surgical History  Procedure Laterality Date  . Abdominal  hysterectomy     Past Medical History  Diagnosis Date  . Cervicalgia   . Pain in limb   . Lumbago   . Primary localized osteoarthrosis, lower leg   . Enthesopathy of hip region    BP 128/70  Pulse 84  Resp 14  Ht  (1.651 m)  Wt 197 lb (89.359 kg)  BMI 32.78 kg/m2  SpO2 97%  Opioid Risk Score:   Fall Risk Score: Moderate Fall Risk (6-13 points) (pt educated and given brochure on fall risk)    Review of Systems  Constitutional: Positive for unexpected weight change.  Gastrointestinal: Positive for diarrhea.  Musculoskeletal: Positive for back pain.  Neurological: Positive for numbness.       Tingling, spasms  Psychiatric/Behavioral: The patient is nervous/anxious.   All other systems reviewed and are negative.       Objective:   Physical Exam  Limited lumbar range of motion 50% flexion 25% extension 25 lateral bending 25% twisting Right knee has decreased sensation Hypersensitivity to touch over tibial tuberosities Normal strength with hip flexion, knee extension, ankle dorsiflexion plantar flexion Gait is needs to the flexed no evidence of toe drag or knee instability      Assessment & Plan:  1. A lumbar post laminectomy syndrome with chronic postoperative pain. Pain is  relatively well controlled with tramadol 100 mg 3 times per day RTC 109mo

## 2014-09-22 ENCOUNTER — Ambulatory Visit: Payer: No Typology Code available for payment source | Attending: Internal Medicine

## 2014-10-04 ENCOUNTER — Encounter: Payer: Self-pay | Admitting: Physical Medicine & Rehabilitation

## 2014-10-04 ENCOUNTER — Ambulatory Visit (HOSPITAL_BASED_OUTPATIENT_CLINIC_OR_DEPARTMENT_OTHER): Payer: No Typology Code available for payment source | Admitting: Physical Medicine & Rehabilitation

## 2014-10-04 ENCOUNTER — Encounter: Payer: No Typology Code available for payment source | Attending: Physical Medicine & Rehabilitation

## 2014-10-04 VITALS — BP 125/66 | HR 71 | Resp 14 | Ht 65.0 in | Wt 193.8 lb

## 2014-10-04 DIAGNOSIS — M961 Postlaminectomy syndrome, not elsewhere classified: Secondary | ICD-10-CM | POA: Insufficient documentation

## 2014-10-04 DIAGNOSIS — G8928 Other chronic postprocedural pain: Secondary | ICD-10-CM | POA: Insufficient documentation

## 2014-10-04 DIAGNOSIS — F17200 Nicotine dependence, unspecified, uncomplicated: Secondary | ICD-10-CM | POA: Insufficient documentation

## 2014-10-04 DIAGNOSIS — M769 Unspecified enthesopathy, lower limb, excluding foot: Secondary | ICD-10-CM | POA: Insufficient documentation

## 2014-10-04 DIAGNOSIS — M1991 Primary osteoarthritis, unspecified site: Secondary | ICD-10-CM | POA: Insufficient documentation

## 2014-10-04 MED ORDER — TRAMADOL HCL 50 MG PO TABS: 100.0000 mg | ORAL_TABLET | Freq: Three times a day (TID) | ORAL | Status: DC | PRN

## 2014-10-04 NOTE — Progress Notes (Signed)
Subjective:    Patient ID: Alyssa [name], female    DOB: [birth year], 58 y.o.   MRN: [id number]  HPI Pain is fairly well controlled on tramadol 100 mg 3 times per day  Still caring for mother who is in a wheelchair after a stroke  Father has been less active secondary to leg wound Patient has more stress on her because of this.  Stable on current medications Also on Lyrica 100 mg 3 times a day prescribed by her primary care  Fell onto bed without injury Pain Inventory Average Pain 4 Pain Right Now 4 My pain is constant, sharp, burning, dull, tingling and aching  In the last 24 hours, has pain interfered with the following? General activity 4 Relation with others 4 Enjoyment of life 1 What TIME of day is your pain at its worst? evening Sleep (in general) Poor  Pain is worse with: some activites Pain improves with: heat/ice and medication Relief from Meds: 6  Mobility walk without assistance  Function disabled: date disabled .  Neuro/Psych weakness numbness tingling spasms depression anxiety  Prior Studies Any changes since last visit?  no  Physicians involved in your care Any changes since last visit?  no   Family History  Problem Relation Age of Onset  . Stroke Mother   . Diabetes Mother   . Diabetes Father    History   Social History  . Marital Status: Married    Spouse Name: N/A    Number of Children: N/A  . Years of Education: N/A   Social History Main Topics  . Smoking status: Current Every Day Smoker -- 1.00 packs/day  . Smokeless tobacco: Never Used  . Alcohol Use: None  . Drug Use: None  . Sexual Activity: None   Other Topics Concern  . None   Social History Narrative  . None   Past Surgical History  Procedure Laterality Date  . Abdominal hysterectomy     Past Medical History  Diagnosis Date  . Cervicalgia   . Pain in limb   . Lumbago   . Primary localized osteoarthrosis, lower leg   . Enthesopathy of hip region     BP 125/66  Pulse 71  Resp 14  Ht  (1.651 m)  Wt 193 lb 12.8 oz (87.907 kg)  BMI 32.25 kg/m2  SpO2 95%  Opioid Risk Score:   Fall Risk Score: Low Fall Risk (0-5 points)  Review of Systems     Objective:   Physical Exam  Nursing note and vitals reviewed. Constitutional: She is oriented to person, place, and time. She appears well-developed and well-nourished.  HENT:  Head: Normocephalic and atraumatic.  Eyes: Conjunctivae and EOM are normal. Pupils are equal, round, and reactive to light.  Neck: Normal range of motion.  Neurological: She is alert and oriented to person, place, and time.  Psychiatric: She has a normal mood and affect.   tenderness to palpation along the right thoracic paraspinal as well as right upper trapezius and right levator  Deep tendon reflexes 1+ bilateral Achilles and 2+ bilateral  Patellar               straight leg raising is negative       Assessment & Plan:  1. A lumbar post laminectomy syndrome with chronic postoperative pain.  Pain is relatively well controlled with tramadol 100 mg 3 times per day  No signs of aberrant drug behavior She has had some caregiver burn out, we discussed  exercise for her but she feels like she is very active just taking care of her mother who is disabled. RTC 53mo

## 2014-10-04 NOTE — Patient Instructions (Signed)
Pace your activities

## 2014-10-05 ENCOUNTER — Other Ambulatory Visit: Payer: Self-pay | Admitting: *Deleted

## 2014-10-05 NOTE — Telephone Encounter (Signed)
recvd refill request via pharmacy for tramadol 50mg  #180 - take 2 tabs QID.  Will call into the pharmacy asap

## 2015-03-28 ENCOUNTER — Ambulatory Visit: Payer: No Typology Code available for payment source | Attending: Internal Medicine

## 2015-04-01 ENCOUNTER — Ambulatory Visit: Payer: No Typology Code available for payment source | Admitting: Physical Medicine & Rehabilitation

## 2015-04-08 ENCOUNTER — Encounter: Payer: Self-pay | Attending: Physical Medicine & Rehabilitation

## 2015-04-08 ENCOUNTER — Ambulatory Visit (HOSPITAL_BASED_OUTPATIENT_CLINIC_OR_DEPARTMENT_OTHER): Payer: No Typology Code available for payment source | Admitting: Physical Medicine & Rehabilitation

## 2015-04-08 VITALS — BP 136/66 | HR 68 | Resp 14

## 2015-04-08 DIAGNOSIS — M545 Low back pain: Secondary | ICD-10-CM | POA: Insufficient documentation

## 2015-04-08 DIAGNOSIS — M961 Postlaminectomy syndrome, not elsewhere classified: Secondary | ICD-10-CM | POA: Insufficient documentation

## 2015-04-08 DIAGNOSIS — G8929 Other chronic pain: Secondary | ICD-10-CM

## 2015-04-08 MED ORDER — TRAMADOL HCL 50 MG PO TABS: 100.0000 mg | ORAL_TABLET | Freq: Three times a day (TID) | ORAL | Status: DC | PRN

## 2015-04-08 NOTE — Progress Notes (Signed)
Subjective:    Patient ID: Alyssa [name], female    DOB: [birth year], 59 y.o.   MRN: [id number]  HPI Pain is fairly well controlled on tramadol 100 mg 3 times per day  Still caring for mother who is in a wheelchair after a stroke   Patient has more stress on her because of this.  L3-S1 Fusion 2004   PCP is Dr Jacky Kindle Pain Inventory Average Pain 4 Pain Right Now 4 My pain is constant, sharp, burning, dull, stabbing, tingling, aching and numb toes  In the last 24 hours, has pain interfered with the following? General activity 4 Relation with others 4 Enjoyment of life 1 What TIME of day is your pain at its worst? evening Sleep (in general) Poor  Pain is worse with: some activites Pain improves with: rest and heat/ice Relief from Meds: 6  Mobility walk without assistance how many minutes can you walk? 5 ability to climb steps?  no do you drive?  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  no  Physicians involved in your care Any changes since last visit?  no   Family History  Problem Relation Age of Onset  . Stroke Mother   . Diabetes Mother   . Diabetes Father    History   Social History  . Marital Status: Married    Spouse Name: N/A  . Number of Children: N/A  . Years of Education: N/A   Social History Main Topics  . Smoking status: Current Every Day Smoker -- 1.00 packs/day  . Smokeless tobacco: Never Used  . Alcohol Use: Not on file  . Drug Use: Not on file  . Sexual Activity: Not on file   Other Topics Concern  . Not on file   Social History Narrative  . No narrative on file   Past Surgical History  Procedure Laterality Date  . Abdominal hysterectomy     Past Medical History  Diagnosis Date  . Cervicalgia   . Pain in limb   . Lumbago   . Primary localized osteoarthrosis, lower leg   . Enthesopathy of hip region    BP 136/66 mmHg  Pulse 68   Resp 14  SpO2 98%  Opioid Risk Score:   Fall Risk Score: Moderate Fall Risk (6-13 points)`1  Depression screen PHQ 2/9  Depression screen PHQ 2/9 04/08/2015  Decreased Interest 1  Down, Depressed, Hopeless 0  PHQ - 2 Score 1  Altered sleeping 1  Tired, decreased energy 1  Change in appetite 0  Feeling bad or failure about yourself  0  Trouble concentrating 0  Moving slowly or fidgety/restless 0  Suicidal thoughts 0  PHQ-9 Score 3     Review of Systems  Constitutional: Positive for unexpected weight change.       Weight gain  HENT: Negative.   Eyes: Negative.   Respiratory: Negative.   Cardiovascular: Negative.   Gastrointestinal: Positive for constipation.  Endocrine: Negative.   Genitourinary: Negative.   Musculoskeletal: Positive for myalgias, back pain and arthralgias.  Skin: Negative.   Allergic/Immunologic: Negative.   Neurological: Negative.   Hematological: Negative.   Psychiatric/Behavioral: Negative.        Objective:   Physical Exam  Constitutional: She is oriented to person, place, and time. She appears well-developed and well-nourished.  HENT:  Head: Normocephalic and atraumatic.  Eyes: Conjunctivae and EOM are normal. Pupils are equal,  round, and reactive to light.  Neck: Normal range of motion.  Musculoskeletal: Normal range of motion.  Neurological: She is alert and oriented to person, place, and time. She has normal strength. No sensory deficit.  5/5 bilateral deltoid, bicep, tricep, grip, hip flexor, knee extensor and ankle dorsiflexor and plantar flexor   Psychiatric: She has a normal mood and affect.  Nursing note and vitals reviewed.         Assessment & Plan:  1.Lumbar post laminectomy syndrome status post L3-S1 fusion 2004 no signs of radiculopathy. Overall functioning at a good level on tramadol 100 mg 3 times a day She is walking about 20 minutes a day. I encouraged her to increase that to 30 minutes per day.  No need for spinal  imaging at the current time Primary physician prescribing Lyrica   Return to clinic 6 months

## 2015-04-08 NOTE — Patient Instructions (Signed)
Try increase walking to /day

## 2015-10-04 ENCOUNTER — Ambulatory Visit (HOSPITAL_BASED_OUTPATIENT_CLINIC_OR_DEPARTMENT_OTHER): Payer: No Typology Code available for payment source | Admitting: Physical Medicine & Rehabilitation

## 2015-10-04 ENCOUNTER — Encounter: Payer: Self-pay | Admitting: Physical Medicine & Rehabilitation

## 2015-10-04 ENCOUNTER — Encounter: Payer: No Typology Code available for payment source | Attending: Physical Medicine & Rehabilitation

## 2015-10-04 VITALS — BP 124/77 | HR 66

## 2015-10-04 DIAGNOSIS — M769 Unspecified enthesopathy, lower limb, excluding foot: Secondary | ICD-10-CM | POA: Insufficient documentation

## 2015-10-04 DIAGNOSIS — M1990 Unspecified osteoarthritis, unspecified site: Secondary | ICD-10-CM | POA: Insufficient documentation

## 2015-10-04 DIAGNOSIS — M961 Postlaminectomy syndrome, not elsewhere classified: Secondary | ICD-10-CM | POA: Insufficient documentation

## 2015-10-04 DIAGNOSIS — M545 Low back pain: Secondary | ICD-10-CM | POA: Insufficient documentation

## 2015-10-04 DIAGNOSIS — G8928 Other chronic postprocedural pain: Secondary | ICD-10-CM | POA: Insufficient documentation

## 2015-10-04 DIAGNOSIS — Z79899 Other long term (current) drug therapy: Secondary | ICD-10-CM | POA: Insufficient documentation

## 2015-10-04 DIAGNOSIS — M542 Cervicalgia: Secondary | ICD-10-CM | POA: Insufficient documentation

## 2015-10-04 DIAGNOSIS — F17210 Nicotine dependence, cigarettes, uncomplicated: Secondary | ICD-10-CM | POA: Insufficient documentation

## 2015-10-04 MED ORDER — TRAMADOL HCL 50 MG PO TABS: 100.0000 mg | ORAL_TABLET | Freq: Three times a day (TID) | ORAL | Status: DC | PRN

## 2015-10-04 NOTE — Progress Notes (Signed)
Subjective:    Patient ID: PeggBuena Irish female    DOB: 22-Aug-1956, 59 y.o.   MRN: [id number]  HPI Very busy taking care of mom and dad No time for working out but has lost 20lb Pain is stable  Pain Inventory Average Pain 4 Pain Right Now 4 My pain is sharp, burning, tingling and aching  In the last 24 hours, has pain interfered with the following? General activity 4 Relation with others 4 Enjoyment of life 1 What TIME of day is your pain at its worst? evening Sleep (in general) Poor  Pain is worse with: walking Pain improves with: rest and heat/ice Relief from Meds: 6  Mobility walk without assistance ability to climb steps?  no do you drive?  yes Do you have any goals in this area?  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  yes  Neuro/Psych No problems in this area  Prior Studies Any changes since last visit?  no  Physicians involved in your care Any changes since last visit?  no   Family History  Problem Relation Age of Onset  . Stroke Mother   . Diabetes Mother   . Diabetes Father    Social History   Social History  . Marital Status: Married    Spouse Name: N/A  . Number of Children: N/A  . Years of Education: N/A   Social History Main Topics  . Smoking status: Current Every Day Smoker -- 1.00 packs/day  . Smokeless tobacco: Never Used  . Alcohol Use: None  . Drug Use: None  . Sexual Activity: Not Asked   Other Topics Concern  . None   Social History Narrative   Past Surgical History  Procedure Laterality Date  . Abdominal hysterectomy     Past Medical History  Diagnosis Date  . Cervicalgia   . Pain in limb   . Lumbago   . Primary localized osteoarthrosis, lower leg   . Enthesopathy of hip region    BP 124/77 mmHg  Pulse 66  SpO2 96%  Opioid Risk Score:   Fall Risk Score:  `1  Depression screen PHQ 2/9  Depression screen PHQ  2/9 04/08/2015  Decreased Interest 1  Down, Depressed, Hopeless 0  PHQ - 2 Score 1  Altered sleeping 1  Tired, decreased energy 1  Change in appetite 0  Feeling bad or failure about yourself  0  Trouble concentrating 0  Moving slowly or fidgety/restless 0  Suicidal thoughts 0  PHQ-9 Score 3    Review of Systems  All other systems reviewed and are negative.      Objective:   Physical Exam  Constitutional: She is oriented to person, place, and time. She appears well-developed and well-nourished.  HENT:  Head: Normocephalic and atraumatic.  Eyes: Conjunctivae and EOM are normal. Pupils are equal, round, and reactive to light.  Neck: Normal range of motion.  Neurological: She is alert and oriented to person, place, and time.  Psychiatric: She has a normal mood and affect.  Nursing note and vitals reviewed.  Lumbar range of motion is full flexion 75% lateral bending and rotation as well as extension Negative straight leg raising 5/5 strength bilateral lower extremities       Assessment & Plan:   1. Lumbar postlaminectomy syndrome with chronic pain she's doing relatively well on her tramadol Continue 100 mg tramadol 3 times a  day Encourage further weight loss. Encourage her to get back into her exercise program however he she does remain quite active taking care of her parents Follow-up in 6 months

## 2015-10-04 NOTE — Patient Instructions (Signed)
Try to get back into your exercise program

## 2015-11-23 ENCOUNTER — Encounter: Payer: Self-pay | Admitting: Internal Medicine

## 2016-04-03 ENCOUNTER — Ambulatory Visit (HOSPITAL_BASED_OUTPATIENT_CLINIC_OR_DEPARTMENT_OTHER): Payer: Self-pay | Admitting: Physical Medicine & Rehabilitation

## 2016-04-03 ENCOUNTER — Encounter: Payer: Self-pay | Admitting: Physical Medicine & Rehabilitation

## 2016-04-03 ENCOUNTER — Encounter: Payer: Self-pay | Attending: Physical Medicine & Rehabilitation

## 2016-04-03 VITALS — BP 140/69 | HR 62 | Wt 154.6 lb

## 2016-04-03 DIAGNOSIS — M961 Postlaminectomy syndrome, not elsewhere classified: Secondary | ICD-10-CM

## 2016-04-03 DIAGNOSIS — G8929 Other chronic pain: Secondary | ICD-10-CM | POA: Insufficient documentation

## 2016-04-03 DIAGNOSIS — M170 Bilateral primary osteoarthritis of knee: Secondary | ICD-10-CM

## 2016-04-03 DIAGNOSIS — M25561 Pain in right knee: Secondary | ICD-10-CM

## 2016-04-03 DIAGNOSIS — M25562 Pain in left knee: Secondary | ICD-10-CM

## 2016-04-03 DIAGNOSIS — M5440 Lumbago with sciatica, unspecified side: Secondary | ICD-10-CM | POA: Insufficient documentation

## 2016-04-03 DIAGNOSIS — F17210 Nicotine dependence, cigarettes, uncomplicated: Secondary | ICD-10-CM | POA: Insufficient documentation

## 2016-04-03 DIAGNOSIS — M1990 Unspecified osteoarthritis, unspecified site: Secondary | ICD-10-CM | POA: Insufficient documentation

## 2016-04-03 DIAGNOSIS — M25559 Pain in unspecified hip: Secondary | ICD-10-CM | POA: Insufficient documentation

## 2016-04-03 DIAGNOSIS — M21061 Valgus deformity, not elsewhere classified, right knee: Secondary | ICD-10-CM | POA: Insufficient documentation

## 2016-04-03 DIAGNOSIS — M25569 Pain in unspecified knee: Secondary | ICD-10-CM | POA: Insufficient documentation

## 2016-04-03 MED ORDER — TRAMADOL HCL 50 MG PO TABS: 100.0000 mg | ORAL_TABLET | Freq: Three times a day (TID) | ORAL | Status: DC | PRN

## 2016-04-03 NOTE — Progress Notes (Signed)
Subjective:    Patient ID: Alyssa [name], female    DOB: 1956/01/17, 60 y.o.   MRN: [id number]  HPI Lost a total of 37lb very busy taking of mom and dad. Problems with bladder prolapse  CC Knee and hip pains Larey Seat about 3 wks ago but no injury Has to lift mom who has had stroke Pain Inventory Average Pain 5 Pain Right Now 5 My pain is sharp, burning, tingling, aching and numbness in toes  In the last 24 hours, has pain interfered with the following? General activity 4 Relation with others 4 Enjoyment of life 1 What TIME of day is your pain at its worst? evening Sleep (in general) Poor  Pain is worse with: no selection Pain improves with: heat/ice and medication Relief from Meds: 6  Mobility ability to climb steps?  no do you drive?  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  no  Physicians involved in your care Any changes since last visit?  no   Family History  Problem Relation Age of Onset  . Stroke Mother   . Diabetes Mother   . Diabetes Father    Social History   Social History  . Marital Status: Married    Spouse Name: N/A  . Number of Children: N/A  . Years of Education: N/A   Social History Main Topics  . Smoking status: Current Every Day Smoker -- 1.00 packs/day  . Smokeless tobacco: Never Used  . Alcohol Use: None  . Drug Use: None  . Sexual Activity: Not Asked   Other Topics Concern  . None   Social History Narrative   Past Surgical History  Procedure Laterality Date  . Abdominal hysterectomy     Past Medical History  Diagnosis Date  . Cervicalgia   . Pain in limb   . Lumbago   . Primary localized osteoarthrosis, lower leg   . Enthesopathy of hip region    BP 140/69 mmHg  Pulse 62  Wt 154 lb 9.6 oz (70.126 kg)  SpO2 95%  Opioid Risk Score:   Fall Risk Score:  `1  Depression  screen PHQ 2/9  Depression screen [facility] 2/9 04/03/2016 04/08/2015  Decreased Interest 1 1  Down, Depressed, Hopeless 0 0  PHQ - 2 Score 1 1  Altered sleeping - 1  Tired, decreased energy - 1  Change in appetite - 0  Feeling bad or failure about yourself  - 0  Trouble concentrating - 0  Moving slowly or fidgety/restless - 0  Suicidal thoughts - 0  PHQ-9 Score - 3     Review of Systems  Gastrointestinal: Positive for constipation.  Musculoskeletal:       Cramps in legs and feet  All other systems reviewed and are negative.      Objective:   Physical Exam  Constitutional: She is oriented to person, place, and time. She appears well-developed and well-nourished.  HENT:  Head: Normocephalic and atraumatic.  Eyes: Conjunctivae and EOM are normal. Pupils are equal, round, and reactive to light.  Neck: Normal range of motion.  Musculoskeletal:       Right knee: She exhibits deformity. Tenderness found.       Lumbar back: She exhibits decreased range of motion.  Valgus deformity right knee  No pain with hip range of motion  Lumbar spine with 50% flexion  extension lateral bending and rotation  Neurological: She is alert and oriented to person, place, and time.  Motor strength is 5/5 bilateral deltoids, biceps, triceps, grip, hip flexor, knee extensor, ankle dorsi flexor  Negative straight leg raising test  Psychiatric: She has a normal mood and affect.  Nursing note and vitals reviewed.         Assessment & Plan:  1. Lumbar postlaminectomy syndrome with chronic pain she's doing relatively well on her tramadol Continue 100 mg tramadol 3 times a day Encourage further weight loss.Probably an additional 10 pounds would be adequate Encourage her to get back into her exercise program however he she does remain quite active taking care of her parents Follow-up in 6 months

## 2016-04-03 NOTE — Patient Instructions (Signed)

## 2016-09-19 ENCOUNTER — Other Ambulatory Visit: Payer: Self-pay | Admitting: Internal Medicine

## 2016-09-19 DIAGNOSIS — Z1231 Encounter for screening mammogram for malignant neoplasm of breast: Secondary | ICD-10-CM

## 2016-09-26 ENCOUNTER — Ambulatory Visit
Admission: RE | Admit: 2016-09-26 | Discharge: 2016-09-26 | Disposition: A | Discharge status: Home / Self Care | Payer: No Typology Code available for payment source | Source: Ambulatory Visit | Attending: Internal Medicine | Admitting: Internal Medicine

## 2016-09-26 DIAGNOSIS — Z1231 Encounter for screening mammogram for malignant neoplasm of breast: Secondary | ICD-10-CM

## 2016-09-28 ENCOUNTER — Other Ambulatory Visit: Payer: Self-pay | Admitting: Internal Medicine

## 2016-09-28 DIAGNOSIS — R928 Other abnormal and inconclusive findings on diagnostic imaging of breast: Secondary | ICD-10-CM

## 2016-10-01 ENCOUNTER — Other Ambulatory Visit: Payer: Self-pay | Admitting: Physical Medicine & Rehabilitation

## 2016-10-01 ENCOUNTER — Other Ambulatory Visit (HOSPITAL_COMMUNITY): Payer: Self-pay | Admitting: *Deleted

## 2016-10-01 DIAGNOSIS — R928 Other abnormal and inconclusive findings on diagnostic imaging of breast: Secondary | ICD-10-CM

## 2016-10-01 MED ORDER — TRAMADOL HCL 50 MG PO TABS: 100.0000 mg | ORAL_TABLET | Freq: Three times a day (TID) | ORAL | 0 refills | Status: DC | PRN

## 2016-10-01 NOTE — Telephone Encounter (Signed)
Called refill to [facility] one month supply. I notified Gigi Gineggy and she will keep her appt Thursday

## 2016-10-01 NOTE — Telephone Encounter (Signed)
Patient has called in reference to her appt on Thursday - she will be out of meds tomorrow morning and is wondering if meds can be called into [facility] - just enough to get her to her appt - patient was offered appts for today or tomorrow, but there are plans and transportation issues preventing that.  Please call pt to answer any questions - and patients last does is tomorrow morning.

## 2016-10-04 ENCOUNTER — Encounter: Payer: Self-pay | Attending: Physical Medicine & Rehabilitation

## 2016-10-04 ENCOUNTER — Encounter: Payer: Self-pay | Admitting: Physical Medicine & Rehabilitation

## 2016-10-04 ENCOUNTER — Ambulatory Visit (HOSPITAL_BASED_OUTPATIENT_CLINIC_OR_DEPARTMENT_OTHER): Payer: Self-pay | Admitting: Physical Medicine & Rehabilitation

## 2016-10-04 VITALS — BP 146/87 | Resp 14 | Wt 139.2 lb

## 2016-10-04 DIAGNOSIS — Z823 Family history of stroke: Secondary | ICD-10-CM | POA: Insufficient documentation

## 2016-10-04 DIAGNOSIS — F17210 Nicotine dependence, cigarettes, uncomplicated: Secondary | ICD-10-CM | POA: Insufficient documentation

## 2016-10-04 DIAGNOSIS — G8929 Other chronic pain: Secondary | ICD-10-CM | POA: Insufficient documentation

## 2016-10-04 DIAGNOSIS — Z90710 Acquired absence of both cervix and uterus: Secondary | ICD-10-CM | POA: Insufficient documentation

## 2016-10-04 DIAGNOSIS — Z9889 Other specified postprocedural states: Secondary | ICD-10-CM | POA: Insufficient documentation

## 2016-10-04 DIAGNOSIS — M961 Postlaminectomy syndrome, not elsewhere classified: Secondary | ICD-10-CM

## 2016-10-04 DIAGNOSIS — M170 Bilateral primary osteoarthritis of knee: Secondary | ICD-10-CM | POA: Insufficient documentation

## 2016-10-04 DIAGNOSIS — Z833 Family history of diabetes mellitus: Secondary | ICD-10-CM | POA: Insufficient documentation

## 2016-10-04 MED ORDER — TRAMADOL HCL 50 MG PO TABS: 100.0000 mg | ORAL_TABLET | Freq: Three times a day (TID) | ORAL | 5 refills | Status: DC | PRN

## 2016-10-04 NOTE — Progress Notes (Signed)
Subjective:    Patient ID: Alyssa [name], female    DOB: [birth year], 60 y.o.   MRN: [id number]  HPI  Alyssa [name] returns today for her 6 month follow-up. She gives an instance of a fall several months ago where she fell asleep on the toilet and bruised her face.  She is prescribed Xanax 1 mg 3 times a day , but despite this doesn't feel like she sleeps very well. 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 She is upset that her siblings do not help with this duty. She also has a disabled 95 year old child at home. Pain Inventory Average Pain 5 Pain Right Now 5 My pain is sharp, burning, tingling and aching  In the last 24 hours, has pain interfered with the following? General activity 4 Relation with others 4 Enjoyment of life 1 What TIME of day is your pain at its worst? evening Sleep (in general) Poor  Pain is worse with: walking, bending, sitting and standing Pain improves with: heat/ice Relief from Meds: 6  Mobility walk without assistance how many minutes can you walk? 10 minutes ability to climb steps?  no Do you have any goals in this area?  yes  Function not employed: date last employed Caregiver for mother disabled: date disabled 1990 I need assistance with the following:  household duties and shopping Do you have any goals in this area?  yes  Neuro/Psych bladder control problems  Prior Studies Any changes since last visit?  no  Physicians involved in your care Primary care Dr. Geoffry Paradise   Family History  Problem Relation Age of Onset  . Stroke Mother   . Diabetes Mother   . Diabetes Father    Social History   Social History  . Marital status: Married    Spouse name: N/A  . Number of children: N/A  . Years of education: N/A   Social History Main Topics  . Smoking status: Current Every Day Smoker    Packs/day: 1.00  . Smokeless tobacco: Never Used  . Alcohol use Not on file  . Drug use: Unknown  .  Sexual activity: Not on file   Other Topics Concern  . Not on file   Social History Narrative  . No narrative on file   Past Surgical History:  Procedure Laterality Date  . ABDOMINAL HYSTERECTOMY     Past Medical History:  Diagnosis Date  . Cervicalgia   . Enthesopathy of hip region   . Lumbago   . Pain in limb   . Primary localized osteoarthrosis, lower leg    BP (!) 146/87   Resp 14   SpO2 94%   Opioid Risk Score:   Fall Risk Score:  `1  Depression screen PHQ 2/9  Depression screen [facility] 2/9 04/03/2016 04/08/2015  Decreased Interest 1 1  Down, Depressed, Hopeless 0 0  PHQ - 2 Score 1 1  Altered sleeping - 1  Tired, decreased energy - 1  Change in appetite - 0  Feeling bad or failure about yourself  - 0  Trouble concentrating - 0  Moving slowly or fidgety/restless - 0  Suicidal thoughts - 0  PHQ-9 Score - 3     Review of Systems  Musculoskeletal:       Muscle cramp, leg & feet  All other systems reviewed and are negative.      Objective:   Physical Exam  Constitutional: She is oriented to person, place, and  time. She appears well-developed and well-nourished.  HENT:  Head: Normocephalic and atraumatic.  Eyes: Conjunctivae and EOM are normal. Pupils are equal, round, and reactive to light.  Neck: Normal range of motion.  Cardiovascular:  Pulses:      Dorsalis pedis pulses are 2+ on the right side, and 2+ on the left side.       Posterior tibial pulses are 2+ on the right side, and 2+ on the left side.  Musculoskeletal:       Right knee: She exhibits normal range of motion, no swelling and no effusion. No tenderness found.       Left knee: She exhibits normal range of motion, no swelling and no effusion. No tenderness found.       Lumbar back: She exhibits decreased range of motion.  Negative straight leg raising test    Neurological: She is alert and oriented to person, place, and time.  Psychiatric: She has a normal mood and affect.  Nursing note and  vitals reviewed.         Assessment & Plan:  1. Lumbar postlaminectomy syndrome with chronic low back pain, status post L3-S1 fusion May 2004. Continue tramadol 100 mg 3 times a day when necessary, patient also notes chronic radicular pain and continues on Lyrica 100 mg 3 times a day  2. Bilateral knee osteoarthritis does not seem to be symptomatic. She has lost around 40 pounds and this certainly could've helped her knee osteoarthritis  Symptoms.

## 2016-10-11 ENCOUNTER — Ambulatory Visit
Admission: RE | Admit: 2016-10-11 | Discharge: 2016-10-11 | Disposition: A | Discharge status: Home / Self Care | Payer: No Typology Code available for payment source | Source: Ambulatory Visit | Attending: Obstetrics and Gynecology | Admitting: Obstetrics and Gynecology

## 2016-10-11 ENCOUNTER — Ambulatory Visit (HOSPITAL_COMMUNITY)
Admission: RE | Admit: 2016-10-11 | Discharge: 2016-10-11 | Disposition: A | Discharge status: Home / Self Care | Payer: Self-pay | Source: Ambulatory Visit | Attending: Obstetrics and Gynecology | Admitting: Obstetrics and Gynecology

## 2016-10-11 ENCOUNTER — Other Ambulatory Visit (HOSPITAL_COMMUNITY): Payer: Self-pay | Admitting: Obstetrics and Gynecology

## 2016-10-11 ENCOUNTER — Encounter (HOSPITAL_COMMUNITY): Payer: Self-pay

## 2016-10-11 VITALS — BP 130/80 | Temp 98.3°F | Ht 63.0 in | Wt 138.6 lb

## 2016-10-11 DIAGNOSIS — N644 Mastodynia: Secondary | ICD-10-CM

## 2016-10-11 DIAGNOSIS — R928 Other abnormal and inconclusive findings on diagnostic imaging of breast: Secondary | ICD-10-CM

## 2016-10-11 DIAGNOSIS — Z1239 Encounter for other screening for malignant neoplasm of breast: Secondary | ICD-10-CM

## 2016-10-11 NOTE — Progress Notes (Signed)
Complaints of left outer breast pain that started 09/11/2016. Patient states the pain comes and goes. Patient rates the pain at a 8 out of 10.  Pap Smear: Pap smear not completed today. Last Pap smear was in 2010 and normal per patient. Per patient has no history of an abnormal Pap smear. Patient has a history of a hysterectomy 38 years ago that patient states because she didn't want anymore children. Patient no longer needs Pap smears due to her history of a hysterectomy for benign reasons and no history of an abnormal Pap smear. No Pap smear results are in EPIC.  Physical exam: Breasts Breasts symmetrical. No skin abnormalities bilateral breasts. No nipple retraction bilateral breasts. No nipple discharge bilateral breasts. No lymphadenopathy. No lumps palpated bilateral breasts. Complaints of left outer upper breast tenderness on exam. [facility] for a left breast diagnostic mammogram and possible left breast ultrasound per recommendation. Appointment scheduled for Thursday, October 11, 2016 at 1350.        Pelvic/Bimanual No Pap smear completed today since patient has a history of a hysterectomy for benign reasons. Pap smear not indicated per BCCCP guidelines.   Smoking History: Patient is a current smoker. Discussed smoking cessation with patient. Referred to the [facility] Q[facility] and gave resources to free smoking cessation classes offered at [facility].   Patient Navigation: Patient education provided. Access to services provided for patient through BCCCP program.   Colorectal Cancer Screening: Patient had a colonoscopy completed 12/13/2005. No complaints today.

## 2016-10-11 NOTE — Patient Instructions (Addendum)
Explained breast self awareness to Alyssa [name]. Patient did not need a Pap smear today due her history of a hysterectomy for benign reasons. Informed patient that she doesn't need any further Pap smears due to her history of a hysterectomy for benign reasons. [facility] for a left breast diagnostic mammogram and possible left breast ultrasound per recommendation. Appointment scheduled for Thursday, October 11, 2016 at 1350. Discussed smoking cessation with patient. Referred to the [facility] Quit[facility] and gave resources to free smoking cessation classes offered at [facility]. Referred patient to the Center [facility] due to her pessary has been in place for 7 years and she hasn't removed to clean. Patient states she feel that her bladder is falling out. Appointment scheduled for Tuesday, October 16, 2016 at 1030. Told patient the appointment is not covered by [facility] and that she will need to complete financial assistance paperwork and there is a $20.00 co-pay. Told patient that if that appointment doesn't work for that she can call to reschedule. All information given to patient. Alyssa [name] verbalized understanding.  Brannock, Kathaleen Maserhristine Poll, RN 2:27 PM

## 2016-10-11 NOTE — Addendum Note (Signed)
Encounter addended by: Priscille Heidelberghristine P Brannock, RN on: 10/11/2016  2:33 PM<BR>    Actions taken: Sign clinical note

## 2016-10-15 ENCOUNTER — Ambulatory Visit
Admission: RE | Admit: 2016-10-15 | Discharge: 2016-10-15 | Disposition: A | Discharge status: Home / Self Care | Payer: No Typology Code available for payment source | Source: Ambulatory Visit | Attending: Obstetrics and Gynecology | Admitting: Obstetrics and Gynecology

## 2016-10-15 ENCOUNTER — Other Ambulatory Visit (HOSPITAL_COMMUNITY): Payer: Self-pay | Admitting: Obstetrics and Gynecology

## 2016-10-15 ENCOUNTER — Encounter (HOSPITAL_COMMUNITY): Payer: Self-pay | Admitting: *Deleted

## 2016-10-15 DIAGNOSIS — R928 Other abnormal and inconclusive findings on diagnostic imaging of breast: Secondary | ICD-10-CM

## 2016-10-16 ENCOUNTER — Ambulatory Visit (INDEPENDENT_AMBULATORY_CARE_PROVIDER_SITE_OTHER): Payer: Self-pay | Admitting: Obstetrics & Gynecology

## 2016-10-16 ENCOUNTER — Encounter: Payer: Self-pay | Admitting: Obstetrics & Gynecology

## 2016-10-16 VITALS — BP 139/83 | HR 59 | Ht 64.0 in | Wt 139.0 lb

## 2016-10-16 DIAGNOSIS — N8110 Cystocele, unspecified: Secondary | ICD-10-CM

## 2016-10-16 NOTE — Progress Notes (Signed)
History:  60 y.o. [id number] here today for removal of pessary that has been in place for 10 years. She reports that she was prev unable to f/u due to come in due to taking care of her mother.    The following portions of the patient's history were reviewed and updated as appropriate: allergies, current medications, past family history, past medical history, past social history, past surgical history and problem list.  Review of Systems:  Pertinent items are noted in HPI.  Objective:  Physical Exam Blood pressure 139/83, pulse (!) 59, height 5\' 4"  (1.626 m), weight 139 lb (63 kg). Gen: NAD Abd: Soft, nontender and nondistended Pelvic: Normal appearing external genitalia; normal appearing vaginal mucosa and cervix.  Normal discharge.  Small uterus, no other palpable masses, no uterine or adnexal tenderness. COMPLETE procidentia noted    Assessment & Plan:  Cystocele Vaginal vault prolapse  Ring pessary removed and replaced 2 3/4 ring replaced F/u in 1 year or sooner prn Pt instructed to remove and clean pessary at least once a month  Ercil Cassis L. Harraway-Smith, M.D., Evern CoreFACOG

## 2016-10-16 NOTE — Patient Instructions (Signed)

## 2016-10-17 ENCOUNTER — Encounter: Payer: Self-pay | Admitting: Obstetrics & Gynecology

## 2017-04-04 ENCOUNTER — Encounter: Payer: Self-pay | Admitting: Physical Medicine & Rehabilitation

## 2017-04-04 ENCOUNTER — Encounter: Payer: Self-pay | Attending: Physical Medicine & Rehabilitation

## 2017-04-04 ENCOUNTER — Ambulatory Visit (HOSPITAL_BASED_OUTPATIENT_CLINIC_OR_DEPARTMENT_OTHER): Payer: No Typology Code available for payment source | Admitting: Physical Medicine & Rehabilitation

## 2017-04-04 VITALS — BP 149/84 | HR 63 | Resp 14 | Wt 128.0 lb

## 2017-04-04 DIAGNOSIS — Z823 Family history of stroke: Secondary | ICD-10-CM | POA: Insufficient documentation

## 2017-04-04 DIAGNOSIS — F17210 Nicotine dependence, cigarettes, uncomplicated: Secondary | ICD-10-CM | POA: Insufficient documentation

## 2017-04-04 DIAGNOSIS — Z8249 Family history of ischemic heart disease and other diseases of the circulatory system: Secondary | ICD-10-CM | POA: Insufficient documentation

## 2017-04-04 DIAGNOSIS — M1990 Unspecified osteoarthritis, unspecified site: Secondary | ICD-10-CM | POA: Insufficient documentation

## 2017-04-04 DIAGNOSIS — M779 Enthesopathy, unspecified: Secondary | ICD-10-CM | POA: Insufficient documentation

## 2017-04-04 DIAGNOSIS — Z90710 Acquired absence of both cervix and uterus: Secondary | ICD-10-CM | POA: Insufficient documentation

## 2017-04-04 DIAGNOSIS — M961 Postlaminectomy syndrome, not elsewhere classified: Secondary | ICD-10-CM

## 2017-04-04 DIAGNOSIS — Z803 Family history of malignant neoplasm of breast: Secondary | ICD-10-CM | POA: Insufficient documentation

## 2017-04-04 MED ORDER — TRAMADOL HCL 50 MG PO TABS: 100.0000 mg | ORAL_TABLET | Freq: Three times a day (TID) | ORAL | 5 refills | Status: DC | PRN

## 2017-04-04 NOTE — Patient Instructions (Signed)
If you would like to reduced on tramadol next step would be 1 tablet QID

## 2017-04-04 NOTE — Progress Notes (Signed)
Subjective:    Patient ID: Alyssa [name], female    DOB: 23-Jan-1956, 54[name]  HPI  61 year old female with history of L3-S1 fusion, chronic low back pain since that time. She was initially treated with narcotic analgesics and is now on tramadol 2 tablets 3 times per day. Her primary care physician prescribes. Lyrica 100 mg 3 times a day She's had no side effects from these medications. 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 General activity 4 Relation with others 4 Enjoyment of life 1 What TIME of day is your pain at its worst? evening Sleep (in general) Poor  Pain is worse with: walking and standing Pain improves with: heat/ice and medication Relief from Meds: 6  Mobility walk without assistance how many minutes can you walk? 5 ability to climb steps?  no do you drive?  yes Do you have any goals in this area?  yes  Function I need assistance with the following:  household duties  Neuro/Psych numbness tingling spasms anxiety  Prior Studies Any changes since last visit?  no  Physicians involved in your care Any changes since last visit?  no   Family History  Problem Relation Age of Onset  . Stroke Mother   . Hypertension Father   . Breast cancer Paternal Aunt    Social History   Social History  . Marital status: Married    Spouse name: N/A  . Number of children: N/A  . Years of education: N/A   Social History Main Topics  . Smoking status: Current Every Day Smoker    Packs/day: 1.00  . Smokeless tobacco: Never Used  . Alcohol use No  . Drug use: No  . Sexual activity: Not Currently   Other Topics Concern  . None   Social History Narrative  . None   Past Surgical History:  Procedure Laterality Date  . ABDOMINAL HYSTERECTOMY    . BACK SURGERY     3  . HEMORRHOID SURGERY     Past Medical History:  Diagnosis Date  . Cervicalgia   . Enthesopathy of hip region   . Lumbago   . Pain in limb   . Primary localized osteoarthrosis, lower leg    BP (!) 149/84 (BP Location: Left Arm, Patient Position: Sitting, Cuff Size: Normal)   Pulse 63   Resp 14   Wt 128 lb (58.1 kg)   SpO2 95%   BMI 21.97 kg/m   Opioid Risk Score:   Fall Risk Score:  `1  Depression screen PHQ 2/9  Depression screen [facility] 2/9 04/03/2016 04/08/2015  Decreased Interest 1 1  Down, Depressed, Hopeless 0 0  PHQ - 2 Score 1 1  Altered sleeping - 1  Tired, decreased energy - 1  Change in appetite - 0  Feeling bad or failure about yourself  - 0  Trouble concentrating - 0  Moving slowly or fidgety/restless - 0  Suicidal thoughts - 0  PHQ-9 Score - 3    Review of Systems  Constitutional: Positive for unexpected weight change.  HENT: Negative.   Eyes: Negative.   Respiratory: Negative.   Cardiovascular: Negative.   Gastrointestinal: Positive for constipation.  Endocrine: Negative.   Genitourinary: Negative.   Musculoskeletal: Positive for arthralgias, back pain, myalgias and neck pain.  Spasms   Skin: Negative.   Allergic/Immunologic: Negative.   Neurological: Positive for numbness.       Tingling  Hematological: Negative.   Psychiatric/Behavioral: The patient is nervous/anxious.   All other systems reviewed and are negative.      Objective:   Physical Exam  Constitutional: She is oriented to person, place, and time. She appears well-developed and well-nourished.  HENT:  Head: Normocephalic and atraumatic.  Eyes: Conjunctivae are normal. Pupils are equal, round, and reactive to light.  Neurological: She is alert and oriented to person, place, and time.  Psychiatric: She has a normal mood and affect.  Nursing note and vitals reviewed.   Mild tenderness palpation around the L3-L4, L5 paraspinal area bilaterally. Lumbar range of motion she  moves well above the fusion. She is able to touch her shins. Lumbar extension is approximately 50%. Lateral bending is mildly painful and 50% to the right and to the left. Straight leg raising is negative. Motor strength is 5/5 bilateral hip flexor, knee extensor, ankle dorsiflexor. Gait is well without evidence of toe drag or knee instability      Assessment & Plan:  1. Lumbar postlaminectomy syndrome with low back pain Continue tramadol 100 mg 3 times a day We discussed that if she wishes to reduce her dose, next step would be tramadol 50 mg 4 times a day RTC 6 months

## 2017-04-05 ENCOUNTER — Encounter: Payer: Self-pay | Admitting: Physical Medicine & Rehabilitation

## 2017-10-01 ENCOUNTER — Ambulatory Visit (HOSPITAL_BASED_OUTPATIENT_CLINIC_OR_DEPARTMENT_OTHER): Payer: No Typology Code available for payment source | Admitting: Physical Medicine & Rehabilitation

## 2017-10-01 ENCOUNTER — Encounter: Payer: Self-pay | Attending: Physical Medicine & Rehabilitation

## 2017-10-01 ENCOUNTER — Encounter: Payer: Self-pay | Admitting: Physical Medicine & Rehabilitation

## 2017-10-01 VITALS — BP 132/71 | HR 57

## 2017-10-01 DIAGNOSIS — Z9889 Other specified postprocedural states: Secondary | ICD-10-CM | POA: Insufficient documentation

## 2017-10-01 DIAGNOSIS — Z79891 Long term (current) use of opiate analgesic: Secondary | ICD-10-CM | POA: Insufficient documentation

## 2017-10-01 DIAGNOSIS — Z823 Family history of stroke: Secondary | ICD-10-CM | POA: Insufficient documentation

## 2017-10-01 DIAGNOSIS — M961 Postlaminectomy syndrome, not elsewhere classified: Secondary | ICD-10-CM

## 2017-10-01 DIAGNOSIS — Z79899 Other long term (current) drug therapy: Secondary | ICD-10-CM | POA: Insufficient documentation

## 2017-10-01 DIAGNOSIS — Z90710 Acquired absence of both cervix and uterus: Secondary | ICD-10-CM | POA: Insufficient documentation

## 2017-10-01 DIAGNOSIS — M542 Cervicalgia: Secondary | ICD-10-CM | POA: Insufficient documentation

## 2017-10-01 DIAGNOSIS — Z8249 Family history of ischemic heart disease and other diseases of the circulatory system: Secondary | ICD-10-CM | POA: Insufficient documentation

## 2017-10-01 DIAGNOSIS — Z803 Family history of malignant neoplasm of breast: Secondary | ICD-10-CM | POA: Insufficient documentation

## 2017-10-01 DIAGNOSIS — M4327 Fusion of spine, lumbosacral region: Secondary | ICD-10-CM | POA: Insufficient documentation

## 2017-10-01 DIAGNOSIS — F17200 Nicotine dependence, unspecified, uncomplicated: Secondary | ICD-10-CM | POA: Insufficient documentation

## 2017-10-01 DIAGNOSIS — M1991 Primary osteoarthritis, unspecified site: Secondary | ICD-10-CM | POA: Insufficient documentation

## 2017-10-01 MED ORDER — TRAMADOL HCL 50 MG PO TABS: 100.0000 mg | ORAL_TABLET | Freq: Three times a day (TID) | ORAL | 5 refills | Status: DC | PRN

## 2017-10-01 NOTE — Patient Instructions (Signed)
May slowly reduce xanax to help reduce memory problems  May cut one tablet in half- that is 2 and 1/2 tablets per day for one month  2 tablets per day per day for the next month

## 2017-10-01 NOTE — Progress Notes (Signed)
Subjective:    Patient ID: Alyssa [name], female    DOB: [birth year], 61 y.o.   MRN: [id number]  HPI Hx of L3-S1 fusion, Chronic low back pain but no significant lower extremity pains. Tramadol  TID Lyrica  TID  Still caring for mom every day,Has a disabled daughter. Pains independent with all self-care and mobility. Pain Inventory Average Pain 4 Pain Right Now 0 My pain is sharp, burning, tingling and aching  In the last 24 hours, has pain interfered with the following? General activity 4 Relation with others 4 Enjoyment of life 1 What TIME of day is your pain at its worst? evening Sleep (in general) Poor  Pain is worse with: walking and standing Pain improves with: heat/ice Relief from Meds: 6  Mobility walk without assistance ability to climb steps?  no do you drive?  yes  Function I need assistance with the following:  household duties  Neuro/Psych numbness spasms  Prior Studies Any changes since last visit?  no  Physicians involved in your care Any changes since last visit?  no   Family History  Problem Relation Age of Onset  . Stroke Mother   . Hypertension Father   . Breast cancer Paternal Aunt    Social History   Social History  . Marital status: Married    Spouse name: N/A  . Number of children: N/A  . Years of education: N/A   Social History Main Topics  . Smoking status: Current Every Day Smoker    Packs/day: 1.00  . Smokeless tobacco: Never Used  . Alcohol use No  . Drug use: No  . Sexual activity: Not Currently   Other Topics Concern  . Not on file   Social History Narrative  . No narrative on file   Past Surgical History:  Procedure Laterality Date  . ABDOMINAL HYSTERECTOMY    . BACK SURGERY     3  . HEMORRHOID SURGERY     Past Medical History:  Diagnosis Date  . Cervicalgia   . Enthesopathy of hip region   . Lumbago   . Pain in limb   . Primary localized osteoarthrosis, lower leg    There were no  vitals taken for this visit.  Opioid Risk Score:   Fall Risk Score:  `1   Depression screen PHQ 2/9  Depression screen [facility] 2/9 04/03/2016 04/08/2015  Decreased Interest 1 1  Down, Depressed, Hopeless 0 0  PHQ - 2 Score 1 1  Altered sleeping - 1  Tired, decreased energy - 1  Change in appetite - 0  Feeling bad or failure about yourself  - 0  Trouble concentrating - 0  Moving slowly or fidgety/restless - 0  Suicidal thoughts - 0  PHQ-9 Score - 3     Review of Systems  Constitutional: Positive for unexpected weight change.  HENT: Negative.   Eyes: Negative.   Respiratory: Negative.   Cardiovascular: Negative.   Gastrointestinal: Positive for constipation.  Endocrine: Negative.   Genitourinary: Negative.   Musculoskeletal: Negative.   Skin: Negative.   Allergic/Immunologic: Negative.   Neurological: Negative.   Hematological: Negative.   Psychiatric/Behavioral: Negative.   All other systems reviewed and are negative.      Objective:   Physical Exam  Constitutional: She is oriented to person, place, and time. She appears well-developed and well-nourished.  HENT:  Head: Normocephalic and atraumatic.  Eyes: Pupils are equal, round, and reactive to light. Conjunctivae and EOM are normal.  Neurological: She  is alert and oriented to person, place, and time.  Skin: Skin is warm and dry.  Nursing note and vitals reviewed.  Lumbar incision is well-healed. There is no evidence of tenderness in the paraspinal musculature. Straight leg raising test is negative. Motor strength is 5/5 bilateral hip flexor, knee extensor, ankle dorsiflexors.       Assessment & Plan:  1.  Lumbar post laminectomy syndrome-  L3-S1 fusion. Patient has had adequate pain relief with her current medication regimen. No side effects from the medications  2.  Patient gives history of some memory problems. We reviewed medication list, most likely culprit would be the alprazolam. Recommend reducing  dosage by 1 half milligram per month, this is currently being prescribed by her primary care physician  Return to clinic 6 months

## 2018-04-01 ENCOUNTER — Ambulatory Visit: Payer: Self-pay | Admitting: Physical Medicine & Rehabilitation

## 2018-04-01 ENCOUNTER — Encounter: Payer: Self-pay | Attending: Physical Medicine & Rehabilitation

## 2018-04-01 ENCOUNTER — Encounter: Payer: Self-pay | Admitting: Physical Medicine & Rehabilitation

## 2018-04-01 VITALS — BP 127/87 | HR 64 | Ht 64.0 in | Wt 125.0 lb

## 2018-04-01 DIAGNOSIS — F17200 Nicotine dependence, unspecified, uncomplicated: Secondary | ICD-10-CM | POA: Insufficient documentation

## 2018-04-01 DIAGNOSIS — M961 Postlaminectomy syndrome, not elsewhere classified: Secondary | ICD-10-CM | POA: Insufficient documentation

## 2018-04-01 NOTE — Patient Instructions (Signed)

## 2018-04-01 NOTE — Progress Notes (Signed)
Subjective:    Patient ID: Alyssa [name], female    DOB: 12/28/1956, 62 y.o.   MRN: [id number]  HPI Still trying to help her mother who has had a stroke. Husband has been hospitalized for valve replacement Pt now taking xanax 1mg  twice a day instead of TID Tramadol 100mg  TID Lyrica 100mg  TID No adverse effects of medication Has Chronic low back pain exacerbated by lifting. Pain Inventory Average Pain 4 Pain Right Now 5 My pain is sharp, burning, tingling and aching  In the last 24 hours, has pain interfered with the following? General activity 4 Relation with others 4 Enjoyment of life 1 What TIME of day is your pain at its worst? evening Sleep (in general) Poor  Pain is worse with: walking and standing Pain improves with: heat/ice Relief from Meds: 7  Mobility walk without assistance ability to climb steps?  no do you drive?  yes  Function not employed: date last employed .  Neuro/Psych numbness spasms depression anxiety  Prior Studies Any changes since last visit?  no  Physicians involved in your care Any changes since last visit?  no   Family History  Problem Relation Age of Onset  . Stroke Mother   . Hypertension Father   . Breast cancer Paternal Aunt    Social History   Socioeconomic History  . Marital status: Married    Spouse name: Not on file  . Number of children: Not on file  . Years of education: Not on file  . Highest education level: Not on file  Occupational History  . Not on file  Social Needs  . Financial resource strain: Not on file  . Food insecurity:    Worry: Not on file    Inability: Not on file  . Transportation needs:    Medical: Not on file    Non-medical: Not on file  Tobacco Use  . Smoking status: Current Every Day Smoker    Packs/day: 1.00  . Smokeless tobacco: Never Used  Substance and Sexual Activity  . Alcohol use: No  . Drug use: No  . Sexual activity: Not Currently  Lifestyle  . Physical activity:    Days per week: Not on file    Minutes per session: Not on file  . Stress: Not on file  Relationships  . Social connections:    Talks on phone: Not on file    Gets together: Not on file    Attends religious service: Not on file    Active member of club or organization: Not on file    Attends meetings of clubs or organizations: Not on file    Relationship status: Not on file  Other Topics Concern  . Not on file  Social History Narrative  . Not on file   Past Surgical History:  Procedure Laterality Date  . ABDOMINAL HYSTERECTOMY    . BACK SURGERY     3  . HEMORRHOID SURGERY     Past Medical History:  Diagnosis Date  . Cervicalgia   . Enthesopathy of hip region   . Lumbago   . Pain in limb   . Primary localized osteoarthrosis, lower leg    There were no vitals taken for this visit.  Opioid Risk Score:   Fall Risk Score:  `1  Depression screen PHQ 2/9  [facility] 2/9 04/03/2016 04/08/2015  Decreased Interest 1 1  Down, Depressed, Hopeless 0 0  PHQ - 2 Score 1 1  Altered sleeping -  1  Tired, decreased energy - 1  Change in appetite - 0  Feeling bad or failure about yourself  - 0  Trouble concentrating - 0  Moving slowly or fidgety/restless - 0  Suicidal thoughts - 0  PHQ-9 Score - 3     Review of Systems  Constitutional: Negative.   HENT: Negative.   Eyes: Negative.   Respiratory: Negative.   Cardiovascular: Negative.   Gastrointestinal: Negative.   Endocrine: Negative.   Genitourinary: Negative.   Musculoskeletal: Positive for arthralgias and myalgias.  Skin: Negative.   Allergic/Immunologic: Negative.   Neurological: Negative.   Hematological: Negative.   Psychiatric/Behavioral: Negative.   All other systems reviewed and are negative.      Objective:   Physical Exam  Constitutional: She is oriented to person, place, and time. She appears well-developed and well-nourished. No distress.  HENT:  Head: Normocephalic and atraumatic.  Eyes:  Pupils are equal, round, and reactive to light. Conjunctivae and EOM are normal.  Neck: Normal range of motion.  Musculoskeletal:  Lumbar range of motion sent for flexion, 25% extension 25% lateral bending and rotation. Spine does have a thoracic kyphosis.  No significant scoliosis 1.  Lumbar postlaminectomy syndrome with chronic  Neurological: She is alert and oriented to person, place, and time.   5/5 strength bilateral deltoid bicep tricep grip hip flexion extension and flexion.  Negative straight leg raising test   Gait is without evidence of toe drag or knee instability.  Skin: Skin is warm and dry. She is not diaphoretic.  Psychiatric: She has a normal mood and affect. Her behavior is normal. Thought content normal.  Nursing note and vitals reviewed.         Assessment & Plan:  1.  Lumbar post laminectomy syndrome with chronic axial as well as radicular back pain. She is getting good relief with her current drug regimen which has been stable over the course of several years which includes tramadol 100 mg 3 times daily Lyrica 100 mg 3 times daily  Indication for chronic opioid: See above Medication and dose: See above # pills per month: Tramadol 180 tablets/month, Lyrica 90 tablets/month Last UDS date: 04/08/2013, plan to repeat next visit Opioid Treatment Agreement signed (Y/N): y Opioid Treatment Agreement last reviewed with patient:   [facility] reviewed this encounter (include red flags):  .04/01/18 PCP is prescribing Xanax 1 mg 3 times daily she states she takes it twice daily.  She has had no signs of overmedication.

## 2018-04-23 ENCOUNTER — Telehealth: Payer: Self-pay

## 2018-04-23 MED ORDER — TRAMADOL HCL 50 MG PO TABS: 100.0000 mg | ORAL_TABLET | Freq: Three times a day (TID) | ORAL | 5 refills | Status: DC | PRN

## 2018-04-23 NOTE — Telephone Encounter (Signed)
Patient called for a refill of tramadol, authorized according to last doctor visit note

## 2018-10-03 ENCOUNTER — Encounter: Payer: Self-pay | Attending: Physical Medicine & Rehabilitation

## 2018-10-03 ENCOUNTER — Ambulatory Visit: Payer: Self-pay | Admitting: Physical Medicine & Rehabilitation

## 2018-10-03 ENCOUNTER — Encounter: Payer: Self-pay | Admitting: Physical Medicine & Rehabilitation

## 2018-10-03 VITALS — BP 141/75 | HR 66 | Resp 14 | Ht 64.0 in | Wt 121.0 lb

## 2018-10-03 DIAGNOSIS — Z79891 Long term (current) use of opiate analgesic: Secondary | ICD-10-CM

## 2018-10-03 DIAGNOSIS — Z5181 Encounter for therapeutic drug level monitoring: Secondary | ICD-10-CM | POA: Insufficient documentation

## 2018-10-03 DIAGNOSIS — Z79899 Other long term (current) drug therapy: Secondary | ICD-10-CM | POA: Insufficient documentation

## 2018-10-03 DIAGNOSIS — M545 Low back pain: Secondary | ICD-10-CM | POA: Insufficient documentation

## 2018-10-03 DIAGNOSIS — G894 Chronic pain syndrome: Secondary | ICD-10-CM

## 2018-10-03 DIAGNOSIS — M961 Postlaminectomy syndrome, not elsewhere classified: Secondary | ICD-10-CM

## 2018-10-03 DIAGNOSIS — F17210 Nicotine dependence, cigarettes, uncomplicated: Secondary | ICD-10-CM | POA: Insufficient documentation

## 2018-10-03 DIAGNOSIS — G8929 Other chronic pain: Secondary | ICD-10-CM

## 2018-10-03 MED ORDER — TRAMADOL HCL 50 MG PO TABS: 100.0000 mg | ORAL_TABLET | Freq: Three times a day (TID) | ORAL | 0 refills | Status: DC | PRN

## 2018-10-03 NOTE — Patient Instructions (Signed)
Try [employer] gay or [employer]

## 2018-10-03 NOTE — Progress Notes (Signed)
Subjective:    Patient ID: Alyssa [name], female    DOB: [birth year], 62 y.o.   MRN: [id number]  HPI 62 year old female with lumbar postlaminectomy syndrome has had some exacerbation of her low back pain. Acute on chronic  Back pain after reaching under the bed 1 wk ago.  Pain is improving with relative rest.  Still taking care of mom who is sup/minA level.  Patient has no pain shooting down her legs.  No numbness or weakness in the lower extremity or groin area.  Patient is ambulatory without assistive device.                                                                                            Pain Inventory Average Pain 4 Pain Right Now 7 My pain is sharp, burning, tingling and aching  In the last 24 hours, has pain interfered with the following? General activity 4 Relation with others 4 Enjoyment of life 1 What TIME of day is your pain at its worst? all Sleep (in general) Poor  Pain is worse with: walking and standing Pain improves with: heat/ice Relief from Meds: 7  Mobility walk without assistance how many minutes can you walk? 5 ability to climb steps?  no do you drive?  yes Do you have any goals in this area?  yes  Function I need assistance with the following:  household duties  Neuro/Psych numbness spasms depression anxiety  Prior Studies Any changes since last visit?  no  Physicians involved in your care Any changes since last visit?  no   Family History  Problem Relation Age of Onset  . Stroke Mother   . Hypertension Father   . Breast cancer Paternal Aunt    Social History   Socioeconomic History  . Marital status: Married    Spouse name: Not on file  . Number of children: Not on file  . Years of education: Not on file  . Highest education level: Not on file  Occupational History  . Not on file  Social Needs  . Financial resource strain: Not on file  . Food insecurity:    Worry: Not on file    Inability: Not on file  .  Transportation needs:    Medical: Not on file    Non-medical: Not on file  Tobacco Use  . Smoking status: Current Every Day Smoker    Packs/day: 1.00  . Smokeless tobacco: Never Used  Substance and Sexual Activity  . Alcohol use: No  . Drug use: No  . Sexual activity: Not Currently  Lifestyle  . Physical activity:    Days per week: Not on file    Minutes per session: Not on file  . Stress: Not on file  Relationships  . Social connections:    Talks on phone: Not on file    Gets together: Not on file    Attends religious service: Not on file    Active member of club or organization: Not on file    Attends meetings of clubs or organizations: Not on file    Relationship status: Not on file  Other  Topics Concern  . Not on file  Social History Narrative  . Not on file   Past Surgical History:  Procedure Laterality Date  . ABDOMINAL HYSTERECTOMY    . BACK SURGERY     3  . HEMORRHOID SURGERY     Past Medical History:  Diagnosis Date  . Cervicalgia   . Enthesopathy of hip region   . Lumbago   . Pain in limb   . Primary localized osteoarthrosis, lower leg    BP (!) 141/75   Pulse 66   Resp 14   Ht 5\' 4"  (1.626 m)   Wt 121 lb (54.9 kg)   SpO2 96%   BMI 20.77 kg/m   Opioid Risk Score:   Fall Risk Score:  `1  Depression screen PHQ 2/9  Depression screen [facility] 2/9 04/03/2016 04/08/2015  Decreased Interest 1 1  Down, Depressed, Hopeless 0 0  PHQ - 2 Score 1 1  Altered sleeping - 1  Tired, decreased energy - 1  Change in appetite - 0  Feeling bad or failure about yourself  - 0  Trouble concentrating - 0  Moving slowly or fidgety/restless - 0  Suicidal thoughts - 0  PHQ-9 Score - 3    Review of Systems  Constitutional: Negative.   HENT: Negative.   Eyes: Negative.   Respiratory: Negative.   Cardiovascular: Negative.   Gastrointestinal: Negative.   Endocrine: Negative.   Genitourinary: Negative.   Musculoskeletal: Positive for arthralgias, back pain, gait  problem and myalgias.       Spasms   Skin: Negative.   Allergic/Immunologic: Negative.   Neurological: Positive for numbness.  Hematological: Negative.   Psychiatric/Behavioral: Positive for dysphoric mood. The patient is nervous/anxious.        Objective:   Physical Exam  Constitutional: She is oriented to person, place, and time. She appears well-developed and well-nourished.  Neurological: She is alert and oriented to person, place, and time.  Skin: Skin is warm and dry.  Psychiatric: She has a normal mood and affect.  Nursing note and vitals reviewed.  Motor strength is 5/5 bilateral hip flexor knee extensor ankle dorsiflexor Negative straight leg raising Low back is mild tenderness palpation right PSIS area mild tenderness over the trochanter of the hip. Ambulates without evidence of toe drag or knee instability Sensation intact in both lower extremities       Assessment & Plan:  #1 treat acute exacerbation of chronic low back pain.  She has underlying diagnosis of lumbar postlaminectomy syndrome.  She has no new radicular symptoms.  She is improving with time. We discussed her medication tramadol 100 mg 3 times daily 1.  Lumbar post lami syndrome Indication for chronic opioid: see above (PCP prescribes Xanax and Lyric) Medication and dose: Tramadol 100mg  TID,  # pills per month: 180 Last UDS date: 2014.  We discussed repeating it today.  She  refuses screening. Opioid Treatment Agreement signed (Y/N): 10/03/18 Opioid Treatment Agreement last reviewed with patient:  10/03/18 [facility] reviewed this encounter (include red flags):  10/03/18 1 month supply of tramadol, no subsequent prescriptions without UDS complete.  She plans to ask her primary to prescribe, follow-up PRN

## 2020-05-09 ENCOUNTER — Ambulatory Visit: Payer: Self-pay | Attending: Internal Medicine

## 2020-05-09 DIAGNOSIS — Z20822 Contact with and (suspected) exposure to covid-19: Secondary | ICD-10-CM

## 2020-05-10 ENCOUNTER — Telehealth: Payer: Self-pay | Admitting: Internal Medicine

## 2020-05-10 LAB — NOVEL CORONAVIRUS, NAA: SARS-CoV-2, NAA: NOT DETECTED

## 2020-05-10 LAB — SARS-COV-2, NAA 2 DAY TAT

## 2020-05-10 NOTE — Telephone Encounter (Signed)
Patient called and received her negative covid test result  

## 2020-05-16 DIAGNOSIS — N898 Other specified noninflammatory disorders of vagina: Secondary | ICD-10-CM | POA: Diagnosis not present

## 2020-05-16 DIAGNOSIS — Z01419 Encounter for gynecological examination (general) (routine) without abnormal findings: Secondary | ICD-10-CM | POA: Diagnosis not present

## 2020-05-16 DIAGNOSIS — N8189 Other female genital prolapse: Secondary | ICD-10-CM | POA: Diagnosis not present

## 2020-06-10 DIAGNOSIS — H524 Presbyopia: Secondary | ICD-10-CM | POA: Diagnosis not present

## 2020-06-10 DIAGNOSIS — H52223 Regular astigmatism, bilateral: Secondary | ICD-10-CM | POA: Diagnosis not present

## 2020-06-10 DIAGNOSIS — H5203 Hypermetropia, bilateral: Secondary | ICD-10-CM | POA: Diagnosis not present

## 2020-06-10 DIAGNOSIS — H25093 Other age-related incipient cataract, bilateral: Secondary | ICD-10-CM | POA: Diagnosis not present

## 2020-07-20 DIAGNOSIS — F418 Other specified anxiety disorders: Secondary | ICD-10-CM | POA: Diagnosis not present

## 2020-07-20 DIAGNOSIS — Z720 Tobacco use: Secondary | ICD-10-CM | POA: Diagnosis not present

## 2020-07-20 DIAGNOSIS — M545 Low back pain: Secondary | ICD-10-CM | POA: Diagnosis not present

## 2020-07-20 DIAGNOSIS — M5380 Other specified dorsopathies, site unspecified: Secondary | ICD-10-CM | POA: Diagnosis not present

## 2020-08-10 DIAGNOSIS — Z1231 Encounter for screening mammogram for malignant neoplasm of breast: Secondary | ICD-10-CM | POA: Diagnosis not present

## 2020-11-16 DIAGNOSIS — N898 Other specified noninflammatory disorders of vagina: Secondary | ICD-10-CM | POA: Diagnosis not present

## 2020-11-16 DIAGNOSIS — N8189 Other female genital prolapse: Secondary | ICD-10-CM | POA: Diagnosis not present

## 2020-11-16 DIAGNOSIS — L723 Sebaceous cyst: Secondary | ICD-10-CM | POA: Diagnosis not present

## 2021-05-25 ENCOUNTER — Other Ambulatory Visit: Payer: Self-pay

## 2021-05-25 ENCOUNTER — Encounter (HOSPITAL_BASED_OUTPATIENT_CLINIC_OR_DEPARTMENT_OTHER): Payer: Self-pay | Admitting: Obstetrics and Gynecology

## 2021-05-25 NOTE — Progress Notes (Signed)
Spoke w/ via phone for pre-op interview---pt Lab needs dos----   Cbc t & s            Lab results------none COVID test ----05-26-2021 1415 (pt overnight stay) Arrive at -------530 am 05-30-2021 NPO after MN NO Solid Food.  Clear liquids from MN until---430 am then npo Med rec completed Medications to take morning of surgery -----tramadol, alprazolam, lyrica Diabetic medication -----n/a Patient instructed no nail polish to be worn day of surgery Patient instructed to bring photo id and insurance card day of surgery Patient aware to have Driver (ride ) / caregive spouse or daughter   for 24 hours after surgery  Patient Special Instructions -----no smoking for 24 hours before surgery Pre-Op special Istructions -----pt given overnight stay instructions Patient verbalized understanding of instructions that were given at this phone interview. Patient denies shortness of breath, chest pain, fever, cough at this phone interview.  Pt stopped 81 mg aspirinb 05-23-2021 per dr Jackelyn Knife instructions

## 2021-05-26 ENCOUNTER — Other Ambulatory Visit (HOSPITAL_COMMUNITY)
Admission: RE | Admit: 2021-05-26 | Discharge: 2021-05-26 | Disposition: A | Discharge status: Home / Self Care | Payer: Medicare Other | Source: Ambulatory Visit | Attending: Obstetrics and Gynecology | Admitting: Obstetrics and Gynecology

## 2021-05-26 DIAGNOSIS — Z01812 Encounter for preprocedural laboratory examination: Secondary | ICD-10-CM | POA: Insufficient documentation

## 2021-05-26 DIAGNOSIS — Z20822 Contact with and (suspected) exposure to covid-19: Secondary | ICD-10-CM | POA: Diagnosis not present

## 2021-05-27 LAB — SARS CORONAVIRUS 2 (TAT 6-24 HRS): SARS Coronavirus 2: NEGATIVE

## 2021-05-29 NOTE — H&P (Signed)
Alyssa [name] is an 65 y.o. female. She has symptomatic cystocele and rectocele, becoming more of an issue.  Has used a pessary since around 2010.  When I saw her a year ago, the pessary she had was too big and uncomfortable.  I had to replace with a smaller one due to narrow introitus, but this pessary is also uncomfortable.  Rather than continue to messing with pessaries, she wishes to proceed with surgical repair.  previous TVH.  She also has painful sebaceous cysts of vulva off and on  Pertinent Gynecological History: Last mammogram: normal  OB History: G2, P2002 SVD x 2   Menstrual History: No LMP recorded. Patient has had a hysterectomy.    Past Medical History:  Diagnosis Date  . 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 Dyspnea    sob with heavy activity  . Headache   . Pain in limb   . Primary localized osteoarthrosis, lower leg    oa  . Wears dentures top  . Wears glasses     Past Surgical History:  Procedure Laterality Date  . ABDOMINAL HYSTERECTOMY  yrs ago   1 ovary left  . BACK SURGERY  yrs ago   x3 bolts  and screws and rods x 2 lower back  . HEMORRHOID SURGERY  yrs ago    Family History  Problem Relation Age of Onset  . Stroke Mother   . Hypertension Father   . Breast cancer Paternal Aunt     Social History:  reports that she has been smoking. She has a 60.00 pack-year smoking history. She has never used smokeless tobacco. She reports that she does not drink alcohol and does not use drugs.  Allergies:  Allergies  Allergen Reactions  . Bee Venom Anaphylaxis  . Arthrotec [Diclofenac-Misoprostol]     Not sure  . Codeine Itching    headache  . Cymbalta [Duloxetine Hcl]     "felt I was sinking into the floor"  . Effexor [Venlafaxine Hydrochloride]     confusion  . Escitalopram Oxalate     confusion  . Hydrocodone     Severe headache, itching  . Neurontin [Gabapentin]     Not sure  . Oxymorphone Hcl     "It didn't work"  . Pentazocine  Lactate     Not sure  . Provigil [Modafinil]     Not sure  . Sertraline Hcl     More depressed    No medications prior to admission.    Review of Systems  Respiratory: Negative.   Cardiovascular: Negative.     Height 5\' 5"  (1.651 m), weight 67.6 kg. Physical Exam Constitutional:      Appearance: Normal appearance.  Cardiovascular:     Rate and Rhythm: Normal rate and regular rhythm.     Heart sounds: Normal heart sounds. No murmur heard.   Pulmonary:     Effort: Pulmonary effort is normal. No respiratory distress.     Breath sounds: Normal breath sounds.  Abdominal:     General: There is no distension.     Palpations: Abdomen is soft. There is no mass.     Tenderness: There is no abdominal tenderness.  Genitourinary:    General: Normal vulva.     Comments: Vaginal cuff well healed Gr I cystocele and rectocele No adnexal mass Several vulvar sebaceous cysts Musculoskeletal:     Cervical back: Normal range of motion and neck supple.  Neurological:     Mental Status:  She is alert.     No results found for this or any previous visit (from the past 24 hour(s)).  No results found.  Assessment/Plan: Symptomatic cystocele and rectocele, pessary not working well anymore and tired of dealing with it.  Also with painful vulvar sebaceous cysts  Surgical procedure, risks, chances of relieving symptoms all discussed, questions answered.  Will admit for A&P repair and removal of vulvar sebaceous cysts  Leighton Roach Aundray Cartlidge 05/29/2021, 7:12 PM

## 2021-05-29 NOTE — Anesthesia Preprocedure Evaluation (Addendum)
Anesthesia Evaluation  Patient identified by MRN, date of birth, ID band Patient awake    Reviewed: Allergy & Precautions, NPO status , Patient's Chart, lab work & pertinent test results  Airway Mallampati: III  TM Distance: >3 FB Neck ROM: Full    Dental  (+) Edentulous Upper, Loose,    Pulmonary shortness of breath and with exertion, Current SmokerPatient did not abstain from smoking.,    Pulmonary exam normal breath sounds clear to auscultation       Cardiovascular negative cardio ROS Normal cardiovascular exam Rhythm:Regular Rate:Normal     Neuro/Psych  Headaches, negative psych ROS   GI/Hepatic negative GI ROS, Neg liver ROS,   Endo/Other  negative endocrine ROS  Renal/GU negative Renal ROS     Musculoskeletal  (+) Arthritis ,   Abdominal   Peds  Hematology negative hematology ROS (+)   Anesthesia Other Findings relacation of vaginal outlet and pelvis  Reproductive/Obstetrics                            Anesthesia Physical Anesthesia Plan  ASA: II  Anesthesia Plan: General   Post-op Pain Management:    Induction: Intravenous  PONV Risk Score and Plan: 3 and Ondansetron, Dexamethasone and Treatment may vary due to age or medical condition  Airway Management Planned: Oral ETT  Additional Equipment:   Intra-op Plan:   Post-operative Plan: Extubation in OR  Informed Consent: I have reviewed the patients History and Physical, chart, labs and discussed the procedure including the risks, benefits and alternatives for the proposed anesthesia with the patient or authorized representative who has indicated his/her understanding and acceptance.     Dental advisory given  Plan Discussed with: CRNA  Anesthesia Plan Comments:         Anesthesia Quick Evaluation

## 2021-05-30 ENCOUNTER — Encounter (HOSPITAL_BASED_OUTPATIENT_CLINIC_OR_DEPARTMENT_OTHER)
Admission: RE | Disposition: A | Discharge status: Home / Self Care | Payer: Self-pay | Source: Home / Self Care | Attending: Obstetrics and Gynecology

## 2021-05-30 ENCOUNTER — Ambulatory Visit (HOSPITAL_BASED_OUTPATIENT_CLINIC_OR_DEPARTMENT_OTHER): Payer: Medicare Other | Admitting: Anesthesiology

## 2021-05-30 ENCOUNTER — Encounter (HOSPITAL_BASED_OUTPATIENT_CLINIC_OR_DEPARTMENT_OTHER): Payer: Self-pay | Admitting: Obstetrics and Gynecology

## 2021-05-30 ENCOUNTER — Ambulatory Visit (HOSPITAL_BASED_OUTPATIENT_CLINIC_OR_DEPARTMENT_OTHER)
Admission: RE | Admit: 2021-05-30 | Discharge: 2021-05-31 | Disposition: A | Discharge status: Home / Self Care | Payer: Medicare Other | Attending: Obstetrics and Gynecology | Admitting: Obstetrics and Gynecology

## 2021-05-30 DIAGNOSIS — F17200 Nicotine dependence, unspecified, uncomplicated: Secondary | ICD-10-CM | POA: Diagnosis not present

## 2021-05-30 DIAGNOSIS — Z91030 Bee allergy status: Secondary | ICD-10-CM | POA: Insufficient documentation

## 2021-05-30 DIAGNOSIS — Z888 Allergy status to other drugs, medicaments and biological substances status: Secondary | ICD-10-CM | POA: Diagnosis not present

## 2021-05-30 DIAGNOSIS — L723 Sebaceous cyst: Secondary | ICD-10-CM | POA: Insufficient documentation

## 2021-05-30 DIAGNOSIS — Z8249 Family history of ischemic heart disease and other diseases of the circulatory system: Secondary | ICD-10-CM | POA: Insufficient documentation

## 2021-05-30 DIAGNOSIS — Z803 Family history of malignant neoplasm of breast: Secondary | ICD-10-CM | POA: Insufficient documentation

## 2021-05-30 DIAGNOSIS — N8111 Cystocele, midline: Secondary | ICD-10-CM | POA: Insufficient documentation

## 2021-05-30 DIAGNOSIS — Z885 Allergy status to narcotic agent status: Secondary | ICD-10-CM | POA: Diagnosis not present

## 2021-05-30 DIAGNOSIS — Z823 Family history of stroke: Secondary | ICD-10-CM | POA: Insufficient documentation

## 2021-05-30 DIAGNOSIS — N8189 Other female genital prolapse: Secondary | ICD-10-CM | POA: Diagnosis present

## 2021-05-30 DIAGNOSIS — N816 Rectocele: Secondary | ICD-10-CM | POA: Diagnosis not present

## 2021-05-30 DIAGNOSIS — N814 Uterovaginal prolapse, unspecified: Secondary | ICD-10-CM | POA: Diagnosis present

## 2021-05-30 HISTORY — PX: ANTERIOR AND POSTERIOR REPAIR: SHX5121

## 2021-05-30 HISTORY — DX: Presence of dental prosthetic device (complete) (partial): Z97.2

## 2021-05-30 HISTORY — DX: Unspecified nephritic syndrome with unspecified morphologic changes: N05.9

## 2021-05-30 HISTORY — PX: VULVAR LESION REMOVAL: SHX5391

## 2021-05-30 HISTORY — DX: Presence of spectacles and contact lenses: Z97.3

## 2021-05-30 HISTORY — DX: Dyspnea, unspecified: R06.00

## 2021-05-30 HISTORY — DX: Headache, unspecified: R51.9

## 2021-05-30 LAB — CBC
HCT: 38.6 % (ref 36.0–46.0)
Hemoglobin: 12.7 g/dL (ref 12.0–15.0)
MCH: 30.4 pg (ref 26.0–34.0)
MCHC: 32.9 g/dL (ref 30.0–36.0)
MCV: 92.3 fL (ref 80.0–100.0)
Platelets: 217 10*3/uL (ref 150–400)
RBC: 4.18 MIL/uL (ref 3.87–5.11)
RDW: 12.8 % (ref 11.5–15.5)
WBC: 5 10*3/uL (ref 4.0–10.5)
nRBC: 0 % (ref 0.0–0.2)

## 2021-05-30 LAB — ABO/RH: ABO/RH(D): A POS

## 2021-05-30 LAB — TYPE AND SCREEN
ABO/RH(D): A POS
Antibody Screen: NEGATIVE

## 2021-05-30 SURGERY — ANTERIOR (CYSTOCELE) AND POSTERIOR REPAIR (RECTOCELE)
Anesthesia: General | Site: Vulva

## 2021-05-30 MED ORDER — HYDROMORPHONE HCL 2 MG PO TABS
ORAL_TABLET | ORAL | Status: AC
  Filled 2021-05-30: qty 1

## 2021-05-30 MED ORDER — PROPOFOL 10 MG/ML IV BOLUS
INTRAVENOUS | Status: AC
  Filled 2021-05-30: qty 40

## 2021-05-30 MED ORDER — ALUM & MAG HYDROXIDE-SIMETH 200-200-20 MG/5ML PO SUSP: 30.0000 mL | ORAL | Status: DC | PRN

## 2021-05-30 MED ORDER — SENNA 8.6 MG PO TABS
ORAL_TABLET | ORAL | Status: AC
  Filled 2021-05-30: qty 1

## 2021-05-30 MED ORDER — DEXAMETHASONE SODIUM PHOSPHATE 4 MG/ML IJ SOLN
INTRAMUSCULAR | Status: DC | PRN
  Administered 2021-05-30: 4 mg via INTRAVENOUS

## 2021-05-30 MED ORDER — ACETAMINOPHEN 500 MG PO TABS
ORAL_TABLET | ORAL | Status: AC
  Filled 2021-05-30: qty 2

## 2021-05-30 MED ORDER — FENTANYL CITRATE (PF) 100 MCG/2ML IJ SOLN
INTRAMUSCULAR | Status: AC
  Filled 2021-05-30: qty 2

## 2021-05-30 MED ORDER — SCOPOLAMINE 1 MG/3DAYS TD PT72
1.0000 | MEDICATED_PATCH | TRANSDERMAL | Status: DC
  Administered 2021-05-30: 1.5 mg via TRANSDERMAL

## 2021-05-30 MED ORDER — SUGAMMADEX SODIUM 200 MG/2ML IV SOLN
INTRAVENOUS | Status: DC | PRN
  Administered 2021-05-30: 140 mg via INTRAVENOUS

## 2021-05-30 MED ORDER — ALPRAZOLAM 0.5 MG PO TABS
1.0000 mg | ORAL_TABLET | Freq: Three times a day (TID) | ORAL | Status: DC
  Administered 2021-05-30 (×2): 1 mg via ORAL

## 2021-05-30 MED ORDER — ROCURONIUM BROMIDE 100 MG/10ML IV SOLN
INTRAVENOUS | Status: DC | PRN
  Administered 2021-05-30: 20 mg via INTRAVENOUS
  Administered 2021-05-30: 30 mg via INTRAVENOUS

## 2021-05-30 MED ORDER — CEFAZOLIN SODIUM-DEXTROSE 2-4 GM/100ML-% IV SOLN
2.0000 g | INTRAVENOUS | Status: AC
  Administered 2021-05-30: 2 g via INTRAVENOUS

## 2021-05-30 MED ORDER — ALPRAZOLAM 0.5 MG PO TABS
ORAL_TABLET | ORAL | Status: AC
  Filled 2021-05-30: qty 2

## 2021-05-30 MED ORDER — SCOPOLAMINE 1 MG/3DAYS TD PT72
MEDICATED_PATCH | TRANSDERMAL | Status: AC
  Filled 2021-05-30: qty 1

## 2021-05-30 MED ORDER — ONDANSETRON HCL 4 MG/2ML IJ SOLN: 4.0000 mg | Freq: Once | INTRAMUSCULAR | Status: DC | PRN

## 2021-05-30 MED ORDER — MENTHOL 3 MG MT LOZG: 1.0000 | LOZENGE | OROMUCOSAL | Status: DC | PRN

## 2021-05-30 MED ORDER — LIDOCAINE HCL (CARDIAC) PF 100 MG/5ML IV SOSY
PREFILLED_SYRINGE | INTRAVENOUS | Status: DC | PRN
  Administered 2021-05-30: 60 mg via INTRAVENOUS

## 2021-05-30 MED ORDER — KETOROLAC TROMETHAMINE 30 MG/ML IJ SOLN
INTRAMUSCULAR | Status: AC
  Filled 2021-05-30: qty 1

## 2021-05-30 MED ORDER — MIDAZOLAM HCL 2 MG/2ML IJ SOLN
INTRAMUSCULAR | Status: AC
  Filled 2021-05-30: qty 2

## 2021-05-30 MED ORDER — ONDANSETRON HCL 4 MG/2ML IJ SOLN: 4.0000 mg | Freq: Four times a day (QID) | INTRAMUSCULAR | Status: DC | PRN

## 2021-05-30 MED ORDER — LACTATED RINGERS IV SOLN: INTRAVENOUS | Status: DC

## 2021-05-30 MED ORDER — ACETAMINOPHEN 500 MG PO TABS
1000.0000 mg | ORAL_TABLET | Freq: Four times a day (QID) | ORAL | Status: DC
  Administered 2021-05-30 – 2021-05-31 (×4): 1000 mg via ORAL

## 2021-05-30 MED ORDER — DEXTROSE-NACL 5-0.45 % IV SOLN: INTRAVENOUS | Status: DC

## 2021-05-30 MED ORDER — ONDANSETRON HCL 4 MG/2ML IJ SOLN
INTRAMUSCULAR | Status: DC | PRN
  Administered 2021-05-30: 4 mg via INTRAVENOUS

## 2021-05-30 MED ORDER — AMISULPRIDE (ANTIEMETIC) 5 MG/2ML IV SOLN: 10.0000 mg | Freq: Once | INTRAVENOUS | Status: DC | PRN

## 2021-05-30 MED ORDER — IBUPROFEN 200 MG PO TABS: 600.0000 mg | ORAL_TABLET | Freq: Four times a day (QID) | ORAL | Status: DC

## 2021-05-30 MED ORDER — SIMETHICONE 80 MG PO CHEW: 80.0000 mg | CHEWABLE_TABLET | Freq: Four times a day (QID) | ORAL | Status: DC | PRN

## 2021-05-30 MED ORDER — CEFAZOLIN SODIUM-DEXTROSE 2-4 GM/100ML-% IV SOLN
INTRAVENOUS | Status: AC
  Filled 2021-05-30: qty 100

## 2021-05-30 MED ORDER — ONDANSETRON HCL 4 MG PO TABS: 4.0000 mg | ORAL_TABLET | Freq: Four times a day (QID) | ORAL | Status: DC | PRN

## 2021-05-30 MED ORDER — HYDROMORPHONE HCL 2 MG PO TABS
1.0000 mg | ORAL_TABLET | ORAL | Status: DC | PRN
  Administered 2021-05-30 – 2021-05-31 (×6): 1 mg via ORAL

## 2021-05-30 MED ORDER — KETOROLAC TROMETHAMINE 15 MG/ML IJ SOLN
INTRAMUSCULAR | Status: AC
  Filled 2021-05-30: qty 1

## 2021-05-30 MED ORDER — SENNA 8.6 MG PO TABS
1.0000 | ORAL_TABLET | Freq: Two times a day (BID) | ORAL | Status: DC
  Administered 2021-05-30 (×2): 8.6 mg via ORAL

## 2021-05-30 MED ORDER — ACETAMINOPHEN 500 MG PO TABS
1000.0000 mg | ORAL_TABLET | ORAL | Status: AC
Start: 2021-05-30 — End: 2021-05-30
  Administered 2021-05-30: 1000 mg via ORAL

## 2021-05-30 MED ORDER — PREGABALIN 100 MG PO CAPS
100.0000 mg | ORAL_CAPSULE | Freq: Three times a day (TID) | ORAL | Status: DC
  Administered 2021-05-30 (×2): 100 mg via ORAL
  Filled 2021-05-30 (×3): qty 1

## 2021-05-30 MED ORDER — FENTANYL CITRATE (PF) 100 MCG/2ML IJ SOLN
25.0000 ug | INTRAMUSCULAR | Status: AC | PRN
  Administered 2021-05-30 (×6): 25 ug via INTRAVENOUS

## 2021-05-30 MED ORDER — KETOROLAC TROMETHAMINE 15 MG/ML IJ SOLN
15.0000 mg | Freq: Once | INTRAMUSCULAR | Status: AC | PRN
  Administered 2021-05-30: 15 mg via INTRAVENOUS

## 2021-05-30 MED ORDER — POLYETHYLENE GLYCOL 3350 17 G PO PACK: 17.0000 g | PACK | Freq: Every day | ORAL | Status: DC

## 2021-05-30 MED ORDER — PROPOFOL 10 MG/ML IV BOLUS
INTRAVENOUS | Status: DC | PRN
  Administered 2021-05-30: 120 mg via INTRAVENOUS
  Administered 2021-05-30: 30 mg via INTRAVENOUS

## 2021-05-30 MED ORDER — 0.9 % SODIUM CHLORIDE (POUR BTL) OPTIME
TOPICAL | Status: DC | PRN
  Administered 2021-05-30: 500 mL

## 2021-05-30 MED ORDER — KETOROLAC TROMETHAMINE 30 MG/ML IJ SOLN: 30.0000 mg | Freq: Once | INTRAMUSCULAR | Status: DC

## 2021-05-30 MED ORDER — HYDROMORPHONE HCL 1 MG/ML IJ SOLN: 1.0000 mg | INTRAMUSCULAR | Status: DC | PRN

## 2021-05-30 MED ORDER — KETOROLAC TROMETHAMINE 30 MG/ML IJ SOLN
30.0000 mg | Freq: Four times a day (QID) | INTRAMUSCULAR | Status: AC
  Administered 2021-05-30: 30 mg via INTRAVENOUS
  Administered 2021-05-30 – 2021-05-31 (×2): 15 mg via INTRAVENOUS

## 2021-05-30 MED ORDER — FENTANYL CITRATE (PF) 100 MCG/2ML IJ SOLN
INTRAMUSCULAR | Status: DC | PRN
  Administered 2021-05-30 (×2): 50 ug via INTRAVENOUS

## 2021-05-30 SURGICAL SUPPLY — 42 items
BLADE SURG 15 STRL LF DISP TIS (BLADE) ×2 IMPLANT
BLADE SURG 15 STRL SS (BLADE) ×3
CATH ROBINSON RED A/P 16FR (CATHETERS) ×6 IMPLANT
CLOTH BEACON ORANGE TIMEOUT ST (SAFETY) ×3 IMPLANT
COVER WAND RF STERILE (DRAPES) ×3 IMPLANT
DECANTER SPIKE VIAL GLASS SM (MISCELLANEOUS) IMPLANT
DRSG PAD ABDOMINAL 8X10 ST (GAUZE/BANDAGES/DRESSINGS) ×3 IMPLANT
DRSG TELFA 3X8 NADH (GAUZE/BANDAGES/DRESSINGS) IMPLANT
GAUZE 4X4 16PLY RFD (DISPOSABLE) ×3 IMPLANT
GAUZE PACKING 2X5 YD STRL (GAUZE/BANDAGES/DRESSINGS) ×3 IMPLANT
GAUZE SPONGE 4X4 12PLY STRL (GAUZE/BANDAGES/DRESSINGS) ×3 IMPLANT
GLOVE SURG ENC MOIS LTX SZ8 (GLOVE) ×3 IMPLANT
GLOVE SURG ORTHO LTX SZ7.5 (GLOVE) ×3 IMPLANT
GLOVE SURG UNDER POLY LF SZ6.5 (GLOVE) ×3 IMPLANT
GLOVE SURG UNDER POLY LF SZ7 (GLOVE) ×3 IMPLANT
GOWN STRL REUS W/TWL LRG LVL3 (GOWN DISPOSABLE) ×9 IMPLANT
GOWN STRL REUS W/TWL XL LVL3 (GOWN DISPOSABLE) ×3 IMPLANT
KIT TURNOVER CYSTO (KITS) ×3 IMPLANT
NEEDLE HYPO 22GX1.5 SAFETY (NEEDLE) IMPLANT
NEEDLE HYPO 25X1 1.5 SAFETY (NEEDLE) IMPLANT
NEEDLE MAYO CATGUT SZ4 (NEEDLE) IMPLANT
NS IRRIG 1000ML POUR BTL (IV SOLUTION) ×3 IMPLANT
PACK PERINEAL COLD (PAD) IMPLANT
PACK VAGINAL WOMENS (CUSTOM PROCEDURE TRAY) ×3 IMPLANT
PAD OB MATERNITY 4.3X12.25 (PERSONAL CARE ITEMS) ×3 IMPLANT
PENCIL SMOKE EVACUATOR (MISCELLANEOUS) IMPLANT
SUT PROLENE 1 CTX 30  8455H (SUTURE)
SUT PROLENE 1 CTX 30 8455H (SUTURE) IMPLANT
SUT SILK 0 SH 30 (SUTURE) IMPLANT
SUT VIC AB 2-0 CT2 27 (SUTURE) ×21 IMPLANT
SUT VIC AB 3-0 CT1 27 (SUTURE)
SUT VIC AB 3-0 CT1 TAPERPNT 27 (SUTURE) IMPLANT
SUT VIC AB 3-0 PS2 18 (SUTURE) ×3
SUT VIC AB 3-0 PS2 18XBRD (SUTURE) ×2 IMPLANT
SUT VIC AB 3-0 SH 18 (SUTURE) ×3 IMPLANT
SUT VIC AB 3-0 SH 27 (SUTURE)
SUT VIC AB 3-0 SH 27X BRD (SUTURE) IMPLANT
SUT VICRYL 0 UR6 27IN ABS (SUTURE) IMPLANT
TOWEL OR 17X26 10 PK STRL BLUE (TOWEL DISPOSABLE) ×6 IMPLANT
TRAY FOLEY W/BAG SLVR 14FR LF (SET/KITS/TRAYS/PACK) ×3 IMPLANT
WATER STERILE IRR 500ML POUR (IV SOLUTION) ×3 IMPLANT
YANKAUER SUCT BULB TIP NO VENT (SUCTIONS) ×3 IMPLANT

## 2021-05-30 NOTE — Transfer of Care (Signed)
Immediate Anesthesia Transfer of Care Note  Patient: Alyssa [name]  Procedure(s) Performed: ANTERIOR (CYSTOCELE) AND POSTERIOR REPAIR (RECTOCELE) (N/A Vagina ) REMOVAL OF VULVAR CYST (N/A Vulva)  Patient Location: PACU  Anesthesia Type:General  Level of Consciousness: awake and patient cooperative  Airway & Oxygen Therapy: Patient Spontanous Breathing and Patient connected to nasal cannula oxygen  Post-op Assessment: Report given to RN and Post -op Vital signs reviewed and stable  Post vital signs: Reviewed and stable  Last Vitals:  Vitals Value Taken Time  BP 172/88 05/30/21 0915  Temp 36.4 C 05/30/21 0913  Pulse 79 05/30/21 0916  Resp 16 05/30/21 0916  SpO2 97 % 05/30/21 0916  Vitals shown include unvalidated device data.  Last Pain:  Vitals:   05/30/21 0637  TempSrc: Oral  PainSc: 10-Worst pain ever      Patients Stated Pain Goal: 5 (05/30/21 3646)  Complications: No complications documented.

## 2021-05-30 NOTE — Op Note (Signed)
Preoperative diagnosis: Symptomatic rectocele and cystocele, vulvar sebaceous cysts Postoperative diagnosis:  Same Procedure: Anterior and posterior repair, I&D of sebaceous cysts Surgeon: Lavina Hamman M.D. Asst.: Ellison Hughs, MD Anesthesia: GETA Findings: She had a Gr II cystocele and a grade II rectocele, several 0.5 to 1.5 cm vulvar sebaceous cysts.  Assistance from Dr. Reina Fuse was necessary for exposure estimated blood loss: 100 cc Complications: None Specimens: None  Procedure in detail:  The patient was taken to the operating room and placed in the dorsosupine position. General anesthesia was induced and  her legs were placed in mobile stirrups. Perineum and vagina were then prepped and draped in usual sterile fashion and bladder drained with a Robinson catheter. A weighted speculum was placed in the vagina. The vaginal cuff was grasped with 2 Allis clamps and a small horizontal piece of vaginal tissue was removed. The cystocele was then mobilized by dissecting anteriorly in the midline from the vaginal cuff to within about 2 cm of the urethral meatus in the midline. Cystocele was mobilized laterally. Cystocele was then reduced with several interrupted sutures of 2-0 Vicryl with adequate reduction. Excess vaginal tissue was removed and the incision was closed with running locking 2-0 Vicryl with adequate closure and adequate hemostasis.  Attention was now turned to the rectocele repair. The hymenal ring was grasped with 2 Allis clamps at a distance that when brought together would still allow two fingers to  easily pass into the vagina. A horizontal piece of tissue was removed. The posterior vaginal mucosa was then dissected in the midline from the hymenal ring to the vaginal apex. Rectocele was then mobilized bilaterally sharply and bluntly.  The rectocele was then reduced with interrupted sutures of 2-0 Vicryl after a rectal exam confirmed this was all rectocele. This achieved good  reduction. A repeat rectal exam confirmed good reduction of the rectocele. Excess vaginal mucosa was then removed sharply. Vagina was closed from the vaginal apex to the hymenal ring with running locking 2-0 Vicryl.  This achieved adequate closure and adequate hemostasis. The vagina was then packed with 2 inch gauze coated with lubricant. A Foley catheter was also placed.   She had about 10 palpable and visible sebaceous cysts on her vulva.  These were incised and sebaceous contents removed.  No significant cysts remained at the end of the procedure.The patient tolerated the procedure well. She was awakened and taken to the recovery in stable condition. Counts were correct, she had PAS hose on throughout the procedure. She received Ancef 2 g for surgical prophylaxis.

## 2021-05-30 NOTE — Interval H&P Note (Signed)
History and Physical Interval Note:  05/30/2021 7:17 AM  Alyssa [name]  has presented today for surgery, with the diagnosis of relacation of vaginal outlet and pelvis.  The various methods of treatment have been discussed with the patient and family. After consideration of risks, benefits and other options for treatment, the patient has consented to  Procedure(s): ANTERIOR (CYSTOCELE) AND POSTERIOR REPAIR (RECTOCELE) (N/A) REMOVAL OF VULVAR CYST (N/A) as a surgical intervention.  The patient's history has been reviewed, patient examined, no change in status, stable for surgery.  I have reviewed the patient's chart and labs.  Questions were answered to the patient's satisfaction.     Leighton Roach Maleta Pacha

## 2021-05-30 NOTE — Anesthesia Procedure Notes (Signed)
Procedure Name: Intubation Date/Time: 05/30/2021 7:44 AM Performed by: Georgeanne Nim, CRNA Pre-anesthesia Checklist: Patient identified, Emergency Drugs available, Suction available, Patient being monitored and Timeout performed Patient Re-evaluated:Patient Re-evaluated prior to induction Oxygen Delivery Method: Circle system utilized Preoxygenation: Pre-oxygenation with 100% oxygen Induction Type: IV induction Ventilation: Mask ventilation without difficulty Laryngoscope Size: Mac and 4 Grade View: Grade I Tube type: Oral Airway Equipment and Method: Stylet Placement Confirmation: ETT inserted through vocal cords under direct vision,  positive ETCO2,  CO2 detector and breath sounds checked- equal and bilateral Secured at: 20 cm Tube secured with: Tape Dental Injury: Teeth and Oropharynx as per pre-operative assessment

## 2021-05-30 NOTE — Anesthesia Postprocedure Evaluation (Signed)
Anesthesia Post Note  Patient: Alyssa [name]  Procedure(s) Performed: ANTERIOR (CYSTOCELE) AND POSTERIOR REPAIR (RECTOCELE) (N/A Vagina ) REMOVAL OF VULVAR CYST (N/A Vulva)     Patient location during evaluation: PACU Anesthesia Type: General Level of consciousness: awake Pain management: pain level controlled Vital Signs Assessment: post-procedure vital signs reviewed and stable Respiratory status: spontaneous breathing, nonlabored ventilation, respiratory function stable and patient connected to nasal cannula oxygen Cardiovascular status: blood pressure returned to baseline and stable Postop Assessment: no apparent nausea or vomiting Anesthetic complications: no   No complications documented.  Last Vitals:  Vitals:   05/30/21 1200 05/30/21 1711  BP: 122/71 118/70  Pulse: 76 88  Resp:  18  Temp:  36.7 C  SpO2: 98% 98%    Last Pain:  Vitals:   05/30/21 1917  TempSrc:   PainSc: 1                  Emoni Yang P Cadel Stairs

## 2021-05-30 NOTE — Progress Notes (Signed)
Post-op check, A&P repair Doing ok, pain controlled for now, some bleeding from I&D sebaceous cysts Afeb, VSS Abd- soft Continue routine care, will remove vaginal packing and foley around 0730 tomorrow and plan for discharge soon after that

## 2021-05-31 ENCOUNTER — Encounter (HOSPITAL_BASED_OUTPATIENT_CLINIC_OR_DEPARTMENT_OTHER): Payer: Self-pay | Admitting: Obstetrics and Gynecology

## 2021-05-31 DIAGNOSIS — N8111 Cystocele, midline: Secondary | ICD-10-CM | POA: Diagnosis not present

## 2021-05-31 LAB — CBC
HCT: 35.3 % — ABNORMAL LOW (ref 36.0–46.0)
Hemoglobin: 11.4 g/dL — ABNORMAL LOW (ref 12.0–15.0)
MCH: 30.1 pg (ref 26.0–34.0)
MCHC: 32.3 g/dL (ref 30.0–36.0)
MCV: 93.1 fL (ref 80.0–100.0)
Platelets: 201 10*3/uL (ref 150–400)
RBC: 3.79 MIL/uL — ABNORMAL LOW (ref 3.87–5.11)
RDW: 12.8 % (ref 11.5–15.5)
WBC: 6.7 10*3/uL (ref 4.0–10.5)
nRBC: 0 % (ref 0.0–0.2)

## 2021-05-31 MED ORDER — ACETAMINOPHEN 500 MG PO TABS
ORAL_TABLET | ORAL | Status: AC
  Filled 2021-05-31: qty 2

## 2021-05-31 MED ORDER — HYDROMORPHONE HCL 2 MG PO TABS
ORAL_TABLET | ORAL | Status: AC
  Filled 2021-05-31: qty 1

## 2021-05-31 MED ORDER — PENTAFLUOROPROP-TETRAFLUOROETH EX AERO
INHALATION_SPRAY | Freq: Once | CUTANEOUS | Status: DC
  Filled 2021-05-31: qty 103.5

## 2021-05-31 MED ORDER — BENZOCAINE-MENTHOL 20-0.5 % EX AERO: 1.0000 "application " | INHALATION_SPRAY | Freq: Four times a day (QID) | CUTANEOUS | 0 refills | Status: DC | PRN

## 2021-05-31 MED ORDER — BENZOCAINE-MENTHOL 20-0.5 % EX AERO
1.0000 "application " | INHALATION_SPRAY | Freq: Four times a day (QID) | CUTANEOUS | Status: DC | PRN
  Filled 2021-05-31: qty 56

## 2021-05-31 MED ORDER — KETOROLAC TROMETHAMINE 30 MG/ML IJ SOLN
INTRAMUSCULAR | Status: AC
  Filled 2021-05-31: qty 1

## 2021-05-31 MED ORDER — HYDROMORPHONE HCL 2 MG PO TABS: 2.0000 mg | ORAL_TABLET | ORAL | 0 refills | Status: DC | PRN

## 2021-05-31 NOTE — Progress Notes (Signed)
1 Day Post-Op Procedure(s) (LRB): ANTERIOR (CYSTOCELE) AND POSTERIOR REPAIR (RECTOCELE) (N/A) REMOVAL OF VULVAR CYST (N/A)  Subjective: Patient reports incisional pain and tolerating PO.    Objective: I have reviewed patient's vital signs, intake and output and labs.  General: alert GI: soft, NT Vaginal Bleeding: minimal, packing and foley removed  Assessment: s/p Procedure(s): ANTERIOR (CYSTOCELE) AND POSTERIOR REPAIR (RECTOCELE) (N/A) REMOVAL OF VULVAR CYST (N/A): stable and progressing well  Plan: Advance diet Encourage ambulation Discontinue IV fluids Discharge home  LOS: 0 days    Zenaida Niece 05/31/2021, 7:29 AM

## 2021-05-31 NOTE — Discharge Summary (Signed)
Physician Discharge Summary  Patient ID: Alyssa [name] MRN: [id number] DOB/AGE: 03/31/1956 65 y.o.  Admit date: 05/30/2021 Discharge date: 05/31/2021  Admission Diagnoses:  Symptomatic pelvic relaxation, vulvar sebaceous cysts  Discharge Diagnoses: Same Active Problems:   Cystocele with prolapse   Pelvic relaxation   Discharged Condition: good  Hospital Course: Admitted for A&P repair and I&D of 10 vulvar sebaceous cysts.  No intraop complications.  Did well overnight, vaginal packing and foley removed POD #1, stable for discharge   Discharge Exam: Blood pressure (!) 110/53, pulse 70, temperature 97.8 F (36.6 C), resp. rate 20, height 5\' 5"  (1.651 m), weight 68.8 kg, SpO2 95 %. General appearance: alert  Disposition: Discharge disposition: 01-Home or Self Care       Discharge Instructions    Call MD for:  persistant dizziness or light-headedness   Complete by: As directed    Call MD for:  persistant nausea and vomiting   Complete by: As directed    Call MD for:  severe uncontrolled pain   Complete by: As directed    Call MD for:  temperature >100.4   Complete by: As directed    Diet - low sodium heart healthy   Complete by: As directed    Increase activity slowly   Complete by: As directed    Lifting restrictions   Complete by: As directed    10 lbs   Sexual Activity Restrictions   Complete by: As directed    Pelvic rest     Allergies as of 05/31/2021      Reactions   Bee Venom Anaphylaxis   Arthrotec [diclofenac-misoprostol]    Not sure   Codeine Itching   headache   Cymbalta [duloxetine Hcl]    "felt I was sinking into the floor"   Effexor [venlafaxine Hydrochloride]    confusion   Escitalopram Oxalate    confusion   Hydrocodone    Severe headache, itching   Neurontin [gabapentin]    Not sure   Oxymorphone Hcl    "It didn't work"   Pentazocine Lactate    Not sure   Provigil [modafinil]    Not sure   Sertraline Hcl    More depressed       Medication List    TAKE these medications   ALPRAZolam 1 MG tablet Commonly known as: XANAX Take 1 mg by mouth 3 (three) times daily.   ascorbic acid 500 MG tablet Commonly known as: VITAMIN C Take 500 mg by mouth daily.   aspirin EC 81 MG tablet Take 81 mg by mouth daily. Swallow whole.   benzocaine-Menthol 20-0.5 % Aero Commonly known as: DERMOPLAST Apply 1 application topically 4 (four) times daily as needed for irritation.   calcium-vitamin D 500-200 MG-UNIT tablet Take 1 tablet by mouth daily.   HYDROmorphone 2 MG tablet Commonly known as: DILAUDID Take 1 tablet (2 mg total) by mouth every 3 (three) hours as needed for severe pain.   multivitamin tablet Take 1 tablet by mouth daily.   polyethylene glycol 17 g packet Commonly known as: MIRALAX / GLYCOLAX Take 17 g by mouth daily. 1 cap per day   pregabalin 100 MG capsule Commonly known as: LYRICA Take 100 mg by mouth 3 (three) times daily.   traMADol 50 MG tablet Commonly known as: ULTRAM Take 2 tablets (100 mg total) by mouth every 8 (eight) hours as needed. What changed: when to take this   vitamin B-12 1000 MCG tablet Commonly known as: CYANOCOBALAMIN Take  1,000 mcg by mouth daily.       Follow-up Information    Ricky Doan, MD. Schedule an appointment as soon as possible for a visit in 2 week(s).   Specialty: Obstetrics and Gynecology Contact information: [address], [address] [place] [address] [phone]               Signed: Leighton Roach Jakarie Pember 05/31/2021, 7:35 AM

## 2021-05-31 NOTE — Discharge Instructions (Signed)
Anterior and Posterior Colporrhaphy, Care After The following information offers guidance on how to care for yourself after your procedure. Your health care provider may also give you more specific instructions. If you have problems or questions, contact your health care provider. What can I expect after the procedure? After the procedure, it is common to have:  Pain in the surgical area.  Vaginal spotting and discharge. You will need to use a sanitary pad during this time.  Tiredness (fatigue). Follow these instructions at home: Medicines  Take over-the-counter and prescription medicines only as told by your health care provider.  Ask your health care provider if the medicine prescribed to you: ? Requires you to avoid driving or using machinery. ? Can cause constipation. You may need to take these actions to prevent or treat constipation:  Drink enough fluid to keep your urine pale yellow.  Take over-the-counter or prescription medicines.  Eat foods that are high in fiber, such as beans, whole grains, and fresh fruits and vegetables.  Limit foods that are high in fat and processed sugars, such as fried or sweet foods. Incision care  Check your incision area every day for signs of infection. Check for: ? More fluid or blood coming from your vagina. ? Pus or a bad-smelling discharge from your vagina.  Do not take baths, swim, or use a hot tub until your health care provider approves. You may take showers.  Keep the area between your vagina and rectum (perineal area) clean and dry. Make sure you clean the area after every bowel movement and each time you urinate.  Ask your health care provider if you can take a sitz bath or sit in a tub of clean, warm water. Activity  Rest as told by your health care provider.  Avoid sitting for a long time without moving. Get up to take short walks every 1-2 hours. This is important to improve blood flow and breathing. Ask for help if you feel  weak or unsteady.  Avoid activities that take a lot of effort.  Limit stair climbing to once or twice a day in the first week, then slowly increase this activity.  Do not lift anything that is heavier than 5 lb (2.3 kg), or the limit that you are told, until your health care provider says that it is safe. Avoid pushing or pulling motions.  Avoid standing for long periods of time.  Do not drive until your health care provider says that it is safe.  Return to your normal activities as told by your health care provider. Ask your health care provider what activities are safe for you.   General instructions  You may be instructed to do pelvic floor exercises (Kegel exercises). Do them as told by your health care provider.  Avoid sex for several weeks after surgery.  Do not douche or use tampons until your health care provider says it is okay.  Wear compression stockings as told by your health care provider. These stockings help to prevent blood clots and reduce swelling in your legs.  Keep all follow-up visits. This is important. Contact a health care provider if:  Medicine does not help your pain.  You have frequent or urgent urination, or you are unable to completely empty your bladder.  You feel a burning sensation when urinating.  You have more fluid or blood coming from your vaginal area.  You have pus or a bad-smelling discharge coming from the vaginal area. Get help right away if:  You cannot urinate.  You have a fever or chills.  You have trouble breathing. These symptoms may represent a serious problem that is an emergency. Do not wait to see if the symptoms will go away. Get medical help right away. Call your local emergency services (911 in the [place] Do not drive yourself to the hospital. Summary  After the procedure, it is common to have pain, tiredness (fatigue), and vaginal spotting and discharge.  Take medicines only as told by your health care provider. Ask  your health care provider whether your medicines may cause constipation or require you to avoid driving or using machinery.  Keep the area between your vagina and rectum (perineal area) area clean and dry. Clean the area after every bowel movement and each time you urinate.  Rest after the procedure. Avoid activities that require a lot of effort, and do not lift heavy objects.  Get help right away if you have trouble breathing, you have a fever or chills, or you cannot urinate. This information is not intended to replace advice given to you by your health care provider. Make sure you discuss any questions you have with your health care provider. Document Revised: 06/14/2020 Document Reviewed: 06/14/2020 Elsevier Patient Education  2021 [employer] Routine instructions for A&P repair

## 2021-07-20 ENCOUNTER — Emergency Department: Payer: Medicare Other

## 2021-07-20 ENCOUNTER — Emergency Department
Admission: EM | Admit: 2021-07-20 | Discharge: 2021-07-20 | Disposition: A | Discharge status: Home / Self Care | Payer: Medicare Other | Attending: Emergency Medicine | Admitting: Emergency Medicine

## 2021-07-20 ENCOUNTER — Other Ambulatory Visit: Payer: Self-pay

## 2021-07-20 ENCOUNTER — Encounter: Payer: Self-pay | Admitting: Emergency Medicine

## 2021-07-20 DIAGNOSIS — Z7982 Long term (current) use of aspirin: Secondary | ICD-10-CM | POA: Insufficient documentation

## 2021-07-20 DIAGNOSIS — F17210 Nicotine dependence, cigarettes, uncomplicated: Secondary | ICD-10-CM | POA: Diagnosis not present

## 2021-07-20 DIAGNOSIS — S0083XA Contusion of other part of head, initial encounter: Secondary | ICD-10-CM

## 2021-07-20 DIAGNOSIS — W010XXA Fall on same level from slipping, tripping and stumbling without subsequent striking against object, initial encounter: Secondary | ICD-10-CM | POA: Insufficient documentation

## 2021-07-20 DIAGNOSIS — W19XXXA Unspecified fall, initial encounter: Secondary | ICD-10-CM

## 2021-07-20 DIAGNOSIS — Z79899 Other long term (current) drug therapy: Secondary | ICD-10-CM | POA: Diagnosis not present

## 2021-07-20 DIAGNOSIS — S0993XA Unspecified injury of face, initial encounter: Secondary | ICD-10-CM | POA: Diagnosis present

## 2021-07-20 NOTE — ED Provider Notes (Signed)
[facility] Emergency Department Provider Note   ____________________________________________    I have reviewed the triage vital signs and the nursing notes.   HISTORY  Chief Complaint Fall     HPI Alyssa [name] is a 65 y.o. female who presents after a fall.  Patient reports she lost her balance and fell forward.  She complains of mild discomfort in her arms but reports primarily she is concerned because she struck the right side of her face.  She does report some discomfort in her back as well and soreness in her knees.  She has been able to ambulate.  No nasal injury or bleeding.  No nausea or vomiting or abdominal pain.  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   Dyspnea    sob with heavy activity   Headache    Pain in limb    Primary localized osteoarthrosis, lower leg    oa   Wears dentures top   Wears glasses     Patient Active Problem List   Diagnosis Date Noted   Cystocele with prolapse 05/30/2021   Pelvic relaxation 05/30/2021   Postlaminectomy syndrome, lumbar region 09/08/2013   Back pain 11/03/2012   Encounter for therapeutic drug monitoring 11/03/2012   Refill clinic medication management patient 11/03/2012   Chronic lower back pain 05/12/2012   Bilateral chronic knee pain 05/12/2012   OA (osteoarthritis) of knee 05/12/2012   Pain in joint of right ankle or foot 05/12/2012    Past Surgical History:  Procedure Laterality Date   ABDOMINAL HYSTERECTOMY  yrs ago   1 ovary left   ANTERIOR AND POSTERIOR REPAIR N/A 05/30/2021   Procedure: ANTERIOR (CYSTOCELE) AND POSTERIOR REPAIR (RECTOCELE);  Surgeon: Lavina Hamman, MD;  [facility];  Service: Gynecology;  Laterality: N/A;   BACK SURGERY  yrs ago   x3 bolts  and screws and rods x 2 lower back   HEMORRHOID SURGERY  yrs ago   VULVAR LESION REMOVAL N/A 05/30/2021    Procedure: REMOVAL OF VULVAR CYST;  Surgeon: Lavina Hamman, MD;  Location: [facility];  Service: Gynecology;  Laterality: N/A;    Prior to Admission medications   Medication Sig Start Date End Date Taking? Authorizing Provider  ALPRAZolam Prudy Feeler) 1 MG tablet Take 1 mg by mouth 3 (three) times daily.    [provider]  ascorbic acid (VITAMIN C) 500 MG tablet Take 500 mg by mouth daily.    [provider]  aspirin EC 81 MG tablet Take 81 mg by mouth daily. Swallow whole.    [provider]  benzocaine-Menthol (DERMOPLAST) 20-0.5 % AERO Apply 1 application topically 4 (four) times daily as needed for irritation. 05/31/21   Meisinger, Todd, MD  Calcium Carbonate-Vitamin D (CALCIUM-VITAMIN D) 500-200 MG-UNIT per tablet Take 1 tablet by mouth daily.    [provider]  HYDROmorphone (DILAUDID) 2 MG tablet Take 1 tablet (2 mg total) by mouth every 3 (three) hours as needed for severe pain. 05/31/21   Meisinger, Tawanna Cooler, MD  Multiple Vitamin (MULTIVITAMIN) tablet Take 1 tablet by mouth daily.    [provider]  polyethylene glycol (MIRALAX / GLYCOLAX) 17 g packet Take 17 g by mouth daily. 1 cap per day    [provider]  pregabalin (LYRICA) 100 MG capsule Take 100 mg by mouth 3 (three) times  daily.    [provider]  traMADol (ULTRAM) 50 MG tablet Take 2 tablets (100 mg total) by mouth every 8 (eight) hours as needed. Patient taking differently: Take 100 mg by mouth 3 (three) times daily. 10/03/18   Kirsteins, Victorino Sparrow, MD  vitamin B-12 (CYANOCOBALAMIN) 1000 MCG tablet Take 1,000 mcg by mouth daily.    [provider]     Allergies Bee venom, Arthrotec [diclofenac-misoprostol], Codeine, Cymbalta [duloxetine hcl], Effexor [venlafaxine hydrochloride], Escitalopram oxalate, Hydrocodone, Neurontin [gabapentin], Oxymorphone hcl, Pentazocine lactate, Provigil [modafinil], and Sertraline hcl  Family History  Problem  Relation Age of Onset   Stroke Mother    Hypertension Father    Breast cancer Paternal Aunt     Social History Social History   Tobacco Use   Smoking status: Every Day    Packs/day: 1.50    Years: 40.00    Pack years: 60.00    Types: Cigarettes   Smokeless tobacco: Never  Vaping Use   Vaping Use: Never used  Substance Use Topics   Alcohol use: No   Drug use: No    Review of Systems  Constitutional: No dizziness Eyes: No visual changes.  ENT: No nasal Cardiovascular: Denies chest pain. Respiratory: Denies shortness of breath. Gastrointestinal: No abdominal pain.  No nausea, no vomiting.   Genitourinary: Negative for dysuria. Musculoskeletal: As above Skin: No laceration Neurological: Negative for headaches or weakness   ____________________________________________   PHYSICAL EXAM:  VITAL SIGNS: ED Triage Vitals [07/20/21 1655]  Enc Vitals Group     BP (!) 147/72     Pulse Rate 66     Resp 20     Temp 97.7 F (36.5 C)     Temp Source Oral     SpO2 97 %     Weight 68 kg (150 lb)     Height 1.676 m (5\' 6" )     Head Circumference      Peak Flow      Pain Score 8     Pain Loc      Pain Edu?      Excl. in GC?     Constitutional: Alert and oriented. No acute distress. Pleasant and interactive Eyes: Conjunctivae are normal.  Head: Mild swelling to the right cheek, no bony abnormalities palpated Nose: No congestion/rhinnorhea. Mouth/Throat: Mucous membranes are moist.   Neck: Mild discomfort with range of motion, no pain with axial load, no vertebral tenderness palpation Cardiovascular: Normal rate, regular rhythm. Good peripheral circulation.  No chest wall tenderness palpation Respiratory: Normal respiratory effort.  No retractions. Lungs CTAB. Gastrointestinal: Soft and nontender. No distention.    Musculoskeletal: Normal range of motion of all extremities, no clavicular tenderness to palpation, no vertebral tenderness to palpation.  No pain with axial  load on both hips. Neurologic:  Normal speech and language. No gross focal neurologic deficits are appreciated.  Skin:  Skin is warm, dry and intact. No rash noted. Psychiatric: Mood and affect are normal. Speech and behavior are normal.  ____________________________________________   LABS (all labs ordered are listed, but only abnormal results are displayed)  Labs Reviewed - No data to display ____________________________________________  EKG   ____________________________________________  RADIOLOGY  CT head cervical spine and max face reviewed by me ____________________________________________   PROCEDURES  Procedure(s) performed: No  Procedures   Critical Care performed: No ____________________________________________   INITIAL IMPRESSION / ASSESSMENT AND PLAN / ED COURSE  Pertinent labs & imaging results that were available during my care of  the patient were reviewed by me and considered in my medical decision making (see chart for details).   Patient presents after a fall with head injury as described above.  Mild contusions to the knees and some soreness to her right thigh but no bony abnormalities suspected.  CT head cervical spine max face pending  CT imaging is negative for acute injury.  Patient appropriate for discharge with outpatient follow-up as needed    ____________________________________________   FINAL CLINICAL IMPRESSION(S) / ED DIAGNOSES  Final diagnoses:  Fall, initial encounter  Contusion of face, initial encounter        Note:  This document was prepared using Dragon voice recognition software and may include unintentional dictation errors.    Jene Every, MD 07/20/21 [phone]

## 2021-07-20 NOTE — ED Triage Notes (Signed)
Pt reports at 3 she was walking into the hospital to bring her daughter for an [place] and she tripped and fell. Pt c/o pain to head, face, neck, bilateral arms and bilateral knees and back. Denies dizziness or LOC.

## 2021-10-12 ENCOUNTER — Other Ambulatory Visit: Payer: Self-pay

## 2021-10-12 ENCOUNTER — Encounter (HOSPITAL_BASED_OUTPATIENT_CLINIC_OR_DEPARTMENT_OTHER): Payer: Self-pay | Admitting: Obstetrics and Gynecology

## 2021-10-12 NOTE — Progress Notes (Addendum)
Spoke w/ via phone for pre-op interview---pt Lab needs dos----   cbc            Lab results------none COVID test -----10-17-2021 overnight stay Arrive at -------530 am 10-19-2021 NPO after MN NO Solid Food.  Clear liquids from MN until---430 am Med rec completed Medications to take morning of surgery -----tramadol, alprazolam, lyrica Diabetic medication -----n/a Patient instructed no nail polish to be worn day of surgery Patient instructed to bring photo id and insurance card day of surgery Patient aware to have Driver (ride ) / caregiver    for 24 hours after surgery spouse or daughter Patient Special Instructions -----no smoking 24 hours before surgery Pre-Op special Istructions -----reviewed pt overnight stay instructions Patient verbalized understanding of instructions that were given at this phone interview. Patient denies shortness of breath, chest pain, fever, cough at this phone interview.   Pt stopped 81 mg aspirin on 10-08-2021 per dr meisinger's instructions

## 2021-10-17 ENCOUNTER — Other Ambulatory Visit: Payer: Self-pay | Admitting: Obstetrics and Gynecology

## 2021-10-18 LAB — SARS CORONAVIRUS 2 (TAT 6-24 HRS): SARS Coronavirus 2: NEGATIVE

## 2021-10-18 NOTE — Anesthesia Preprocedure Evaluation (Addendum)
Anesthesia Evaluation  Patient identified by MRN, date of birth, ID band Patient awake    Reviewed: Allergy & Precautions, NPO status , Patient's Chart, lab work & pertinent test results  Airway Mallampati: III  TM Distance: >3 FB Neck ROM: Full    Dental  (+) Edentulous Upper, Loose,    Pulmonary shortness of breath and with exertion, Current Smoker and Patient abstained from smoking.,    Pulmonary exam normal breath sounds clear to auscultation       Cardiovascular negative cardio ROS Normal cardiovascular exam Rhythm:Regular Rate:Normal     Neuro/Psych  Headaches,  Neuromuscular disease negative psych ROS   GI/Hepatic negative GI ROS, Neg liver ROS,   Endo/Other  negative endocrine ROS  Renal/GU Renal disease     Musculoskeletal  (+) Arthritis ,   Abdominal   Peds  Hematology negative hematology ROS (+)   Anesthesia Other Findings relacation of vaginal outlet and pelvis  Reproductive/Obstetrics                            Anesthesia Physical  Anesthesia Plan  ASA: 3  Anesthesia Plan: General   Post-op Pain Management:    Induction: Intravenous  PONV Risk Score and Plan: 3 and Ondansetron, Dexamethasone, Treatment may vary due to age or medical condition and Midazolam  Airway Management Planned: LMA  Additional Equipment: None  Intra-op Plan:   Post-operative Plan: Extubation in OR  Informed Consent: I have reviewed the patients History and Physical, chart, labs and discussed the procedure including the risks, benefits and alternatives for the proposed anesthesia with the patient or authorized representative who has indicated his/her understanding and acceptance.     Dental advisory given  Plan Discussed with: CRNA  Anesthesia Plan Comments:        Anesthesia Quick Evaluation

## 2021-10-18 NOTE — H&P (Signed)
Alyssa [name] is an 65 y.o. female. She had previous hysterectomy in 1995, underwent A&P repair 05-30-21 for symptomatic cystocele and rectocele.  She initially did well after surgery.  However, she presented on 08-10-21 with c/o something protruding from vagina.  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  She wants surgical repair.  Pertinent Gynecological History: Last mammogram: normal Date: 2021 OB History: G2, P2002   Menstrual History: No LMP recorded. Patient has had a hysterectomy.    Past Medical History:  Diagnosis Date   Bright's disease age 3   issue resolved   Dyspnea    sob with heavy activity   Headache    Pain in limb    Primary localized osteoarthrosis, lower leg    oa   Wears dentures top   Wears glasses     Past Surgical History:  Procedure Laterality Date   ABDOMINAL HYSTERECTOMY  yrs ago   1 ovary left   ANTERIOR AND POSTERIOR REPAIR N/A 05/30/2021   Procedure: ANTERIOR (CYSTOCELE) AND POSTERIOR REPAIR (RECTOCELE);  Surgeon: Lavina Hamman, MD;  Location: [facility];  Service: Gynecology;  Laterality: N/A;   BACK SURGERY  yrs ago   x3 bolts  and screws and rods x 2 lower back   HEMORRHOID SURGERY  yrs ago   VULVAR LESION REMOVAL N/A 05/30/2021   Procedure: REMOVAL OF VULVAR CYST;  Surgeon: Lavina Hamman, MD;  Location: [facility];  Service: Gynecology;  Laterality: N/A;    Family History  Problem Relation Age of Onset   Stroke Mother    Hypertension Father    Breast cancer Paternal Aunt     Social History:  reports that she has been smoking. She has a 60.00 pack-year smoking history. She has never used smokeless tobacco. She reports that she does not drink alcohol and does not use drugs.  Allergies:  Allergies  Allergen Reactions   Bee Venom Anaphylaxis   Arthrotec [Diclofenac-Misoprostol]     Not sure   Codeine Itching    headache   Cymbalta [Duloxetine Hcl]      "felt I was sinking into the floor"   Effexor [Venlafaxine Hydrochloride]     confusion   Escitalopram Oxalate     confusion   Hydrocodone     Severe headache, itching   Neurontin [Gabapentin]     Not sure   Oxymorphone Hcl     "It didn't work"   Pentazocine Lactate     Not sure   Provigil [Modafinil]     Not sure   Sertraline Hcl     More depressed    No medications prior to admission.    Review of Systems  Respiratory: Negative.    Cardiovascular: Negative.    There were no vitals taken for this visit. Physical Exam Constitutional:      Appearance: Normal appearance.  Cardiovascular:     Rate and Rhythm: Normal rate and regular rhythm.     Heart sounds: Normal heart sounds. No murmur heard. Pulmonary:     Effort: Pulmonary effort is normal. No respiratory distress.     Breath sounds: Normal breath sounds.  Abdominal:     General: There is no distension.     Palpations: Abdomen is soft. There is no mass.     Tenderness: There is no abdominal tenderness.  Genitourinary:    Comments: Has a few small vulvar sebaceous cysts Vaginal vault comes to introitus Previous anterior  and posterior repairs seem to be holding up well, no cystocele or rectocele No pelvic mass Musculoskeletal:     Cervical back: Normal range of motion and neck supple.  Neurological:     Mental Status: She is alert.    No results found for this or any previous visit (from the past 24 hour(s)).  No results found.  Assessment/Plan: Symptomatic vaginal vault prolapse.  Discussed medical and surgical options, she wants surgical repair.  Discussed surgical procedure, risks, alternatives, chances of relieving symptoms, chances of recurrent prolapse, questions answered.  Will admit for sacrospinous vaginal vault suspension.  [name] 10/18/2021, 7:07 PM

## 2021-10-19 ENCOUNTER — Ambulatory Visit (HOSPITAL_BASED_OUTPATIENT_CLINIC_OR_DEPARTMENT_OTHER): Payer: Medicare Other | Admitting: Anesthesiology

## 2021-10-19 ENCOUNTER — Encounter (HOSPITAL_BASED_OUTPATIENT_CLINIC_OR_DEPARTMENT_OTHER)
Admission: RE | Disposition: A | Discharge status: Home / Self Care | Payer: Self-pay | Source: Ambulatory Visit | Attending: Obstetrics and Gynecology

## 2021-10-19 ENCOUNTER — Ambulatory Visit (HOSPITAL_BASED_OUTPATIENT_CLINIC_OR_DEPARTMENT_OTHER)
Admission: RE | Admit: 2021-10-19 | Discharge: 2021-10-20 | Disposition: A | Discharge status: Home / Self Care | Payer: Medicare Other | Source: Ambulatory Visit | Attending: Obstetrics and Gynecology | Admitting: Obstetrics and Gynecology

## 2021-10-19 ENCOUNTER — Other Ambulatory Visit: Payer: Self-pay

## 2021-10-19 ENCOUNTER — Encounter (HOSPITAL_BASED_OUTPATIENT_CLINIC_OR_DEPARTMENT_OTHER): Payer: Self-pay | Admitting: Obstetrics and Gynecology

## 2021-10-19 DIAGNOSIS — F17200 Nicotine dependence, unspecified, uncomplicated: Secondary | ICD-10-CM | POA: Insufficient documentation

## 2021-10-19 DIAGNOSIS — N819 Female genital prolapse, unspecified: Secondary | ICD-10-CM | POA: Diagnosis present

## 2021-10-19 DIAGNOSIS — R0602 Shortness of breath: Secondary | ICD-10-CM | POA: Diagnosis not present

## 2021-10-19 DIAGNOSIS — Z885 Allergy status to narcotic agent status: Secondary | ICD-10-CM | POA: Diagnosis not present

## 2021-10-19 DIAGNOSIS — Z91030 Bee allergy status: Secondary | ICD-10-CM | POA: Diagnosis not present

## 2021-10-19 DIAGNOSIS — N8110 Cystocele, unspecified: Secondary | ICD-10-CM | POA: Diagnosis not present

## 2021-10-19 DIAGNOSIS — Z888 Allergy status to other drugs, medicaments and biological substances status: Secondary | ICD-10-CM | POA: Diagnosis not present

## 2021-10-19 HISTORY — PX: VAGINAL PROLAPSE REPAIR: SHX830

## 2021-10-19 LAB — CBC
HCT: 38.3 % (ref 36.0–46.0)
Hemoglobin: 12.4 g/dL (ref 12.0–15.0)
MCH: 29.6 pg (ref 26.0–34.0)
MCHC: 32.4 g/dL (ref 30.0–36.0)
MCV: 91.4 fL (ref 80.0–100.0)
Platelets: 207 10*3/uL (ref 150–400)
RBC: 4.19 MIL/uL (ref 3.87–5.11)
RDW: 12.9 % (ref 11.5–15.5)
WBC: 6.1 10*3/uL (ref 4.0–10.5)
nRBC: 0 % (ref 0.0–0.2)

## 2021-10-19 SURGERY — SUSPENSION, VAGINAL VAULT
Anesthesia: General | Site: Vagina

## 2021-10-19 MED ORDER — CEFAZOLIN SODIUM-DEXTROSE 2-4 GM/100ML-% IV SOLN
2.0000 g | INTRAVENOUS | Status: AC
  Administered 2021-10-19: 2 g via INTRAVENOUS

## 2021-10-19 MED ORDER — KETOROLAC TROMETHAMINE 30 MG/ML IJ SOLN
INTRAMUSCULAR | Status: AC
  Filled 2021-10-19: qty 1

## 2021-10-19 MED ORDER — DEXAMETHASONE SODIUM PHOSPHATE 10 MG/ML IJ SOLN
INTRAMUSCULAR | Status: DC | PRN
  Administered 2021-10-19: 10 mg via INTRAVENOUS

## 2021-10-19 MED ORDER — FENTANYL CITRATE (PF) 100 MCG/2ML IJ SOLN
25.0000 ug | INTRAMUSCULAR | Status: DC | PRN
  Administered 2021-10-19: 50 ug via INTRAVENOUS
  Administered 2021-10-19: 25 ug via INTRAVENOUS

## 2021-10-19 MED ORDER — MIDAZOLAM HCL 2 MG/2ML IJ SOLN
INTRAMUSCULAR | Status: AC
  Filled 2021-10-19: qty 2

## 2021-10-19 MED ORDER — MEPERIDINE HCL 25 MG/ML IJ SOLN
INTRAMUSCULAR | Status: AC
  Filled 2021-10-19: qty 1

## 2021-10-19 MED ORDER — PROPOFOL 10 MG/ML IV BOLUS
INTRAVENOUS | Status: DC | PRN
Start: 2021-10-19 — End: 2021-10-19
  Administered 2021-10-19: 120 mg via INTRAVENOUS

## 2021-10-19 MED ORDER — DEXAMETHASONE SODIUM PHOSPHATE 10 MG/ML IJ SOLN
INTRAMUSCULAR | Status: AC
  Filled 2021-10-19: qty 1

## 2021-10-19 MED ORDER — ACETAMINOPHEN 500 MG PO TABS
1000.0000 mg | ORAL_TABLET | Freq: Four times a day (QID) | ORAL | Status: DC
  Administered 2021-10-19 – 2021-10-20 (×3): 1000 mg via ORAL

## 2021-10-19 MED ORDER — OXYCODONE HCL 5 MG PO TABS
5.0000 mg | ORAL_TABLET | ORAL | Status: DC | PRN
  Administered 2021-10-19: 10 mg via ORAL
  Administered 2021-10-19 – 2021-10-20 (×3): 5 mg via ORAL

## 2021-10-19 MED ORDER — PROMETHAZINE HCL 25 MG/ML IJ SOLN: 6.2500 mg | INTRAMUSCULAR | Status: DC | PRN

## 2021-10-19 MED ORDER — PREGABALIN 100 MG PO CAPS
100.0000 mg | ORAL_CAPSULE | Freq: Three times a day (TID) | ORAL | Status: DC
  Administered 2021-10-19 (×2): 100 mg via ORAL
  Filled 2021-10-19 (×2): qty 1

## 2021-10-19 MED ORDER — LACTATED RINGERS IV SOLN: INTRAVENOUS | Status: DC

## 2021-10-19 MED ORDER — ALUM & MAG HYDROXIDE-SIMETH 200-200-20 MG/5ML PO SUSP: 30.0000 mL | ORAL | Status: DC | PRN

## 2021-10-19 MED ORDER — IBUPROFEN 200 MG PO TABS: 600.0000 mg | ORAL_TABLET | Freq: Four times a day (QID) | ORAL | Status: DC

## 2021-10-19 MED ORDER — ALPRAZOLAM 0.5 MG PO TABS
1.0000 mg | ORAL_TABLET | Freq: Three times a day (TID) | ORAL | Status: DC
  Administered 2021-10-19 (×2): 1 mg via ORAL
  Filled 2021-10-19: qty 2

## 2021-10-19 MED ORDER — POLYETHYLENE GLYCOL 3350 17 G PO PACK
17.0000 g | PACK | Freq: Every day | ORAL | Status: DC
  Administered 2021-10-19: 17 g via ORAL

## 2021-10-19 MED ORDER — OXYCODONE HCL 5 MG PO TABS
ORAL_TABLET | ORAL | Status: AC
  Filled 2021-10-19: qty 1

## 2021-10-19 MED ORDER — PROPOFOL 10 MG/ML IV BOLUS
INTRAVENOUS | Status: AC
  Filled 2021-10-19: qty 20

## 2021-10-19 MED ORDER — LIDOCAINE 2% (20 MG/ML) 5 ML SYRINGE
INTRAMUSCULAR | Status: DC | PRN
  Administered 2021-10-19: 60 mg via INTRAVENOUS

## 2021-10-19 MED ORDER — BISACODYL 10 MG RE SUPP: 10.0000 mg | Freq: Every day | RECTAL | Status: DC | PRN

## 2021-10-19 MED ORDER — ACETAMINOPHEN 500 MG PO TABS
ORAL_TABLET | ORAL | Status: AC
  Filled 2021-10-19: qty 2

## 2021-10-19 MED ORDER — 0.9 % SODIUM CHLORIDE (POUR BTL) OPTIME
TOPICAL | Status: DC | PRN
  Administered 2021-10-19: 500 mL

## 2021-10-19 MED ORDER — MIDAZOLAM HCL 5 MG/5ML IJ SOLN
INTRAMUSCULAR | Status: DC | PRN
  Administered 2021-10-19: 2 mg via INTRAVENOUS

## 2021-10-19 MED ORDER — FENTANYL CITRATE (PF) 100 MCG/2ML IJ SOLN
INTRAMUSCULAR | Status: AC
  Filled 2021-10-19: qty 2

## 2021-10-19 MED ORDER — HYDROMORPHONE HCL 1 MG/ML IJ SOLN
INTRAMUSCULAR | Status: AC
  Filled 2021-10-19: qty 1

## 2021-10-19 MED ORDER — ALPRAZOLAM 0.5 MG PO TABS
ORAL_TABLET | ORAL | Status: AC
  Filled 2021-10-19: qty 2

## 2021-10-19 MED ORDER — ONDANSETRON HCL 4 MG PO TABS: 4.0000 mg | ORAL_TABLET | Freq: Four times a day (QID) | ORAL | Status: DC | PRN

## 2021-10-19 MED ORDER — KETOROLAC TROMETHAMINE 30 MG/ML IJ SOLN
30.0000 mg | Freq: Four times a day (QID) | INTRAMUSCULAR | Status: DC
Start: 2021-10-19 — End: 2021-10-20
  Administered 2021-10-19 – 2021-10-20 (×3): 30 mg via INTRAVENOUS

## 2021-10-19 MED ORDER — ONDANSETRON HCL 4 MG/2ML IJ SOLN: 4.0000 mg | Freq: Four times a day (QID) | INTRAMUSCULAR | Status: DC | PRN

## 2021-10-19 MED ORDER — KETOROLAC TROMETHAMINE 30 MG/ML IJ SOLN
INTRAMUSCULAR | Status: DC | PRN
  Administered 2021-10-19: 30 mg via INTRAVENOUS

## 2021-10-19 MED ORDER — FENTANYL CITRATE (PF) 100 MCG/2ML IJ SOLN
INTRAMUSCULAR | Status: DC | PRN
  Administered 2021-10-19: 50 ug via INTRAVENOUS
  Administered 2021-10-19 (×2): 25 ug via INTRAVENOUS

## 2021-10-19 MED ORDER — MENTHOL 3 MG MT LOZG: 1.0000 | LOZENGE | OROMUCOSAL | Status: DC | PRN

## 2021-10-19 MED ORDER — ONDANSETRON HCL 4 MG/2ML IJ SOLN
INTRAMUSCULAR | Status: DC | PRN
  Administered 2021-10-19: 4 mg via INTRAVENOUS

## 2021-10-19 MED ORDER — LIDOCAINE 2% (20 MG/ML) 5 ML SYRINGE
INTRAMUSCULAR | Status: AC
  Filled 2021-10-19: qty 5

## 2021-10-19 MED ORDER — ONDANSETRON HCL 4 MG/2ML IJ SOLN
INTRAMUSCULAR | Status: AC
  Filled 2021-10-19: qty 2

## 2021-10-19 MED ORDER — MEPERIDINE HCL 25 MG/ML IJ SOLN
6.2500 mg | INTRAMUSCULAR | Status: DC | PRN
  Administered 2021-10-19: 12.5 mg via INTRAVENOUS

## 2021-10-19 MED ORDER — HYDROMORPHONE HCL 1 MG/ML IJ SOLN
1.0000 mg | INTRAMUSCULAR | Status: DC | PRN
Start: 2021-10-19 — End: 2021-10-20
  Administered 2021-10-19: 1 mg via INTRAVENOUS

## 2021-10-19 MED ORDER — DEXTROSE-NACL 5-0.45 % IV SOLN: INTRAVENOUS | Status: DC

## 2021-10-19 MED ORDER — KETOROLAC TROMETHAMINE 30 MG/ML IJ SOLN: 30.0000 mg | Freq: Once | INTRAMUSCULAR | Status: DC

## 2021-10-19 MED ORDER — WHITE PETROLATUM EX OINT
TOPICAL_OINTMENT | CUTANEOUS | Status: AC
  Filled 2021-10-19: qty 5

## 2021-10-19 MED ORDER — POLYETHYLENE GLYCOL 3350 17 G PO PACK
PACK | ORAL | Status: AC
  Filled 2021-10-19: qty 1

## 2021-10-19 MED ORDER — SIMETHICONE 80 MG PO CHEW: 80.0000 mg | CHEWABLE_TABLET | Freq: Four times a day (QID) | ORAL | Status: DC | PRN

## 2021-10-19 MED ORDER — CEFAZOLIN SODIUM-DEXTROSE 2-4 GM/100ML-% IV SOLN
INTRAVENOUS | Status: AC
  Filled 2021-10-19: qty 100

## 2021-10-19 MED ORDER — OXYCODONE HCL 5 MG PO TABS
ORAL_TABLET | ORAL | Status: AC
  Filled 2021-10-19: qty 2

## 2021-10-19 SURGICAL SUPPLY — 37 items
BLADE SURG 15 STRL LF DISP TIS (BLADE) ×1 IMPLANT
BLADE SURG 15 STRL SS (BLADE) ×2
CATH ROBINSON RED A/P 16FR (CATHETERS) ×2 IMPLANT
CLOTH BEACON ORANGE TIMEOUT ST (SAFETY) ×2 IMPLANT
DECANTER SPIKE VIAL GLASS SM (MISCELLANEOUS) IMPLANT
DEVICE CAPIO SLIM SINGLE (INSTRUMENTS) ×2 IMPLANT
GAUZE 4X4 16PLY ~~LOC~~+RFID DBL (SPONGE) ×2 IMPLANT
GAUZE PACKING 2X5 YD STRL (GAUZE/BANDAGES/DRESSINGS) ×2 IMPLANT
GLOVE SURG ENC MOIS LTX SZ6.5 (GLOVE) ×2 IMPLANT
GLOVE SURG ENC MOIS LTX SZ8 (GLOVE) IMPLANT
GLOVE SURG ORTHO LTX SZ7.5 (GLOVE) ×2 IMPLANT
GLOVE SURG UNDER POLY LF SZ6.5 (GLOVE) ×2 IMPLANT
GLOVE SURG UNDER POLY LF SZ7 (GLOVE) ×2 IMPLANT
GOWN STRL REUS W/ TWL LRG LVL3 (GOWN DISPOSABLE) ×3 IMPLANT
GOWN STRL REUS W/TWL LRG LVL3 (GOWN DISPOSABLE) ×6
GOWN STRL REUS W/TWL XL LVL3 (GOWN DISPOSABLE) ×2 IMPLANT
KIT TURNOVER CYSTO (KITS) ×2 IMPLANT
NDL SUT 6 .5 CRC .975X.05 MAYO (NEEDLE) ×1 IMPLANT
NEEDLE HYPO 22GX1.5 SAFETY (NEEDLE) IMPLANT
NEEDLE MAYO CATGUT SZ4 (NEEDLE) IMPLANT
NEEDLE MAYO TAPER (NEEDLE) ×2
NS IRRIG 1000ML POUR BTL (IV SOLUTION) ×2 IMPLANT
PACK VAGINAL WOMENS (CUSTOM PROCEDURE TRAY) ×2 IMPLANT
SURGILUBE 2OZ TUBE FLIPTOP (MISCELLANEOUS) ×2 IMPLANT
SUT CAPIO PGA 48IN SZ 0 (SUTURE) ×4 IMPLANT
SUT PROLENE 1 CTX 30  8455H (SUTURE) ×2
SUT PROLENE 1 CTX 30 8455H (SUTURE) ×1 IMPLANT
SUT SILK 0 SH 30 (SUTURE) ×2 IMPLANT
SUT SILK 3 0 SH 30 (SUTURE) ×2 IMPLANT
SUT VIC AB 2-0 CT2 27 (SUTURE) ×10 IMPLANT
SUT VIC AB 3-0 CT1 27 (SUTURE)
SUT VIC AB 3-0 CT1 TAPERPNT 27 (SUTURE) IMPLANT
SUT VIC AB 3-0 SH 27 (SUTURE)
SUT VIC AB 3-0 SH 27X BRD (SUTURE) IMPLANT
SUT VICRYL 0 UR6 27IN ABS (SUTURE) IMPLANT
TOWEL OR 17X26 10 PK STRL BLUE (TOWEL DISPOSABLE) ×2 IMPLANT
TRAY FOLEY W/BAG SLVR 14FR LF (SET/KITS/TRAYS/PACK) ×2 IMPLANT

## 2021-10-19 NOTE — Progress Notes (Signed)
Post-op check, s/p sacrospinous vaginal vault suspension  Doing ok, pain controlled, no n/v, has ambulated Afeb, VSS, clear urine  Continue routine care.  I will remove vaginal packing and foley in am and prepare for d/c home

## 2021-10-19 NOTE — Op Note (Signed)
Preoperative diagnosis: Vaginal vault prolapse Postop diagnosis: Same Procedure: Sacrospinous vaginal vault suspension Surgeon Lavina Hamman, MD Assistant Ellison Hughs, MD Findings: She had complete vaginal vault prolapse with preservation of her previous anterior and posterior repairs. Estimated blood loss: 50 cc Specimens: None Complications: None  Procedure: The patient was taken to the operating room and placed in the dorsal supine position.  General anesthesia was induced.  Legs were placed in mobile stirrups and elevated.  Perineum and vagina were then prepped and draped in the usual sterile fashion and bladder drained with a red Robinson catheter.  The vaginal cuff was then identified and grasped with Allis clamps.  A silk suture was placed on the cuff at the right side just to mark it.  A transverse incision was made about 1 cm posterior to the cuff.  Sharp and blunt dissection were then carried out to the right ischial spine.  All tissue was moved off of the spine and medial.  The Capio needle device was used to place 2 Prolene sutures through the sacrospinous ligament.  This had good purchase.  A free needle was then used to pass the sutures through the interior vaginal cuff mucosa at the site of the previously marked cuff.  2-0 Vicryl was then used to start closing the horizontal incision.  The vagina was then suspended with the Prolene sutures and these were tied with good vaginal vault suspension.  The sutures were cut.  The remainder of the vaginal incision was then closed with the running locking 2-0 Vicryl.  Good closure and good suspension were achieved.  The vagina was then packed with 2 inch gauze with lubricant and a Foley catheter was placed.  The patient tolerated the procedure well, she was taken to the recovery room in stable condition.  Counts were correct, she received Ancef preop, she had PAS hose on throughout the procedure.

## 2021-10-19 NOTE — Anesthesia Postprocedure Evaluation (Signed)
Anesthesia Post Note  Patient: Alyssa [name]  Procedure(s) Performed: VAGINAL VAULT SUSPENSION (Vagina )     Patient location during evaluation: PACU Anesthesia Type: General Level of consciousness: awake and alert Pain management: pain level controlled Vital Signs Assessment: post-procedure vital signs reviewed and stable Respiratory status: spontaneous breathing, nonlabored ventilation, respiratory function stable and patient connected to nasal cannula oxygen Cardiovascular status: blood pressure returned to baseline and stable Postop Assessment: no apparent nausea or vomiting Anesthetic complications: no   No notable events documented.  Last Vitals:  Vitals:   10/19/21 1533 10/19/21 1534  BP:    Pulse:    Resp:    Temp:    SpO2: 91% 95%    Last Pain:  Vitals:   10/19/21 1028  TempSrc: Oral  PainSc: 9                  [name] S

## 2021-10-19 NOTE — Interval H&P Note (Signed)
History and Physical Interval Note:  10/19/2021 7:10 AM  Alyssa [name]  has presented today for surgery, with the diagnosis of vaginal prolapse.  The various methods of treatment have been discussed with the patient and family. After consideration of risks, benefits and other options for treatment, the patient has consented to  Procedure(s): VAGINAL VAULT SUSPENSION (N/A) as a surgical intervention.  The patient's history has been reviewed, patient examined, no change in status, stable for surgery.  I have reviewed the patient's chart and labs.  Questions were answered to the patient's satisfaction.     Leighton Roach Annsleigh Dragoo

## 2021-10-19 NOTE — Transfer of Care (Signed)
Immediate Anesthesia Transfer of Care Note  Patient: [name]  Procedure(s) Performed: VAGINAL VAULT SUSPENSION (Vagina )  Patient Location: PACU  Anesthesia Type:General  Level of Consciousness: awake, alert , oriented and patient cooperative  Airway & Oxygen Therapy: Patient Spontanous Breathing and Patient connected to face mask oxygen  Post-op Assessment: Report given to RN and Post -op Vital signs reviewed and stable  Post vital signs: Reviewed and stable  Last Vitals:  Vitals Value Taken Time  BP 164/83 10/19/21 0823  Temp 36.4 C 10/19/21 0823  Pulse 76 10/19/21 0825  Resp 11 10/19/21 0825  SpO2 97 % 10/19/21 0825  Vitals shown include unvalidated device data.  Last Pain:  Vitals:   10/19/21 0823  TempSrc: Oral  PainSc:       Patients Stated Pain Goal: 4 (10/19/21 0533)  Complications: No notable events documented.

## 2021-10-19 NOTE — Progress Notes (Signed)
Pt drowsy.  O2 sats decreasing to mid 80's.  O2 mask placed on pt- sats now in the high 90"s.  Assisted pt in repositioning to the left side.  Pt resting and in no apparent distress.  VSS

## 2021-10-19 NOTE — Anesthesia Procedure Notes (Signed)
Procedure Name: LMA Insertion Date/Time: 10/19/2021 7:24 AM Performed by: Bishop Limbo, CRNA Pre-anesthesia Checklist: Patient identified, Emergency Drugs available, Suction available and Patient being monitored Patient Re-evaluated:Patient Re-evaluated prior to induction Oxygen Delivery Method: Circle System Utilized Preoxygenation: Pre-oxygenation with 100% oxygen Induction Type: IV induction Ventilation: Mask ventilation without difficulty LMA: LMA inserted LMA Size: 4.0 Number of attempts: 1 Placement Confirmation: positive ETCO2 Tube secured with: Tape Dental Injury: Teeth and Oropharynx as per pre-operative assessment

## 2021-10-20 ENCOUNTER — Encounter (HOSPITAL_BASED_OUTPATIENT_CLINIC_OR_DEPARTMENT_OTHER): Payer: Self-pay | Admitting: Obstetrics and Gynecology

## 2021-10-20 DIAGNOSIS — N8110 Cystocele, unspecified: Secondary | ICD-10-CM | POA: Diagnosis not present

## 2021-10-20 LAB — CBC
HCT: 37.3 % (ref 36.0–46.0)
Hemoglobin: 11.9 g/dL — ABNORMAL LOW (ref 12.0–15.0)
MCH: 29.7 pg (ref 26.0–34.0)
MCHC: 31.9 g/dL (ref 30.0–36.0)
MCV: 93 fL (ref 80.0–100.0)
Platelets: 207 10*3/uL (ref 150–400)
RBC: 4.01 MIL/uL (ref 3.87–5.11)
RDW: 12.9 % (ref 11.5–15.5)
WBC: 8.5 10*3/uL (ref 4.0–10.5)
nRBC: 0 % (ref 0.0–0.2)

## 2021-10-20 MED ORDER — OXYCODONE HCL 5 MG PO TABS
ORAL_TABLET | ORAL | Status: AC
  Filled 2021-10-20: qty 1

## 2021-10-20 MED ORDER — ACETAMINOPHEN 500 MG PO TABS
ORAL_TABLET | ORAL | Status: AC
  Filled 2021-10-20: qty 2

## 2021-10-20 MED ORDER — OXYCODONE HCL 5 MG PO TABS: 5.0000 mg | ORAL_TABLET | ORAL | 0 refills | Status: DC | PRN

## 2021-10-20 MED ORDER — KETOROLAC TROMETHAMINE 30 MG/ML IJ SOLN
INTRAMUSCULAR | Status: AC
  Filled 2021-10-20: qty 1

## 2021-10-20 NOTE — Discharge Summary (Signed)
Physician Discharge Summary  Patient ID: Alyssa [name] MRN: [id number] DOB/AGE: [birth year] 65 y.o.  Admit date: 10/19/2021 Discharge date: 10/20/2021  Admission Diagnoses:  Vaginal vault prolapse  Discharge Diagnoses: same Active Problems:   Vaginal vault prolapse   Discharged Condition: good  Hospital Course: Pt admitted, had sacrospinous vaginal vault suspension without complications.  No post-op complications.  Vaginal packing and foley removed POD #1, stable for discharge after she voids   Discharge Exam: Blood pressure 126/71, pulse 63, temperature 98.2 F (36.8 C), resp. rate 14, height 5\' 6"  (1.676 m), weight 74.4 kg, SpO2 98 %. General appearance: alert  Disposition: Discharge disposition: 01-Home or Self Care       Discharge Instructions     Call MD for:  persistant nausea and vomiting   Complete by: As directed    Call MD for:  severe uncontrolled pain   Complete by: As directed    Call MD for:  temperature >100.4   Complete by: As directed    Diet - low sodium heart healthy   Complete by: As directed    Increase activity slowly   Complete by: As directed    Lifting restrictions   Complete by: As directed    10 lbs   Sexual Activity Restrictions   Complete by: As directed    Pelvic rest      Allergies as of 10/20/2021       Reactions   Bee Venom Anaphylaxis   Arthrotec [diclofenac-misoprostol]    Not sure   Codeine Itching   headache   Cymbalta [duloxetine Hcl]    "felt I was sinking into the floor"   Effexor [venlafaxine Hydrochloride]    confusion   Escitalopram Oxalate    confusion   Hydrocodone    Severe headache, itching   Neurontin [gabapentin]    Not sure   Oxymorphone Hcl    "It didn't work"   Pentazocine Lactate    Not sure   Provigil [modafinil]    Not sure   Sertraline Hcl    More depressed        Medication List     TAKE these medications    ALPRAZolam 1 MG tablet Commonly known as: XANAX Take 1 mg by  mouth 3 (three) times daily.   ascorbic acid 500 MG tablet Commonly known as: VITAMIN C Take 500 mg by mouth daily.   aspirin EC 81 MG tablet Take 81 mg by mouth daily. Swallow whole.   benzocaine-Menthol 20-0.5 % Aero Commonly known as: DERMOPLAST Apply 1 application topically 4 (four) times daily as needed for irritation.   calcium-vitamin D 500-200 MG-UNIT tablet Take 1 tablet by mouth daily.   HYDROmorphone 2 MG tablet Commonly known as: DILAUDID Take 1 tablet (2 mg total) by mouth every 3 (three) hours as needed for severe pain.   multivitamin tablet Take 1 tablet by mouth daily.   oxyCODONE 5 MG immediate release tablet Commonly known as: Oxy IR/ROXICODONE Take 1 tablet (5 mg total) by mouth every 4 (four) hours as needed for severe pain.   polyethylene glycol 17 g packet Commonly known as: MIRALAX / GLYCOLAX Take 17 g by mouth daily. 1 cap per day   pregabalin 100 MG capsule Commonly known as: LYRICA Take 100 mg by mouth 3 (three) times daily.   traMADol 50 MG tablet Commonly known as: ULTRAM Take 2 tablets (100 mg total) by mouth every 8 (eight) hours as needed. What changed: when to take this  vitamin B-12 1000 MCG tablet Commonly known as: CYANOCOBALAMIN Take 1,000 mcg by mouth daily.        Follow-up Information     Primo Innis, MD. Schedule an appointment as soon as possible for a visit in 6 week(s).   Specialty: Obstetrics and Gynecology Contact information: [address] [place] [address] [phone]                 Signed: Leighton Roach Migdalia Olejniczak 10/20/2021, 7:26 AM

## 2021-10-20 NOTE — Progress Notes (Signed)
1 Day Post-Op Procedure(s) (LRB): VAGINAL VAULT SUSPENSION (N/A)  Subjective: Patient reports incisional pain and tolerating PO.    Objective: I have reviewed patient's vital signs, intake and output, and labs.  General: alert Vaginal Bleeding: minimal and vaginal packing and foley removed  Assessment: s/p Procedure(s): VAGINAL VAULT SUSPENSION (N/A): stable and progressing well  Plan: Advance diet Encourage ambulation Advance to PO medication Discontinue IV fluids Discharge home  LOS: 0 days    Zenaida Niece 10/20/2021, 7:22 AM

## 2021-10-20 NOTE — Discharge Instructions (Signed)
Call for heavy bleeding, severe pain, or fever

## 2022-01-10 ENCOUNTER — Ambulatory Visit: Payer: Medicare Other | Admitting: Obstetrics and Gynecology

## 2022-01-10 ENCOUNTER — Other Ambulatory Visit (HOSPITAL_COMMUNITY)
Admission: RE | Admit: 2022-01-10 | Discharge: 2022-01-10 | Disposition: A | Discharge status: Home / Self Care | Payer: Medicare Other | Source: Ambulatory Visit | Attending: Obstetrics and Gynecology | Admitting: Obstetrics and Gynecology

## 2022-01-10 ENCOUNTER — Encounter: Payer: Self-pay | Admitting: Obstetrics and Gynecology

## 2022-01-10 ENCOUNTER — Other Ambulatory Visit: Payer: Self-pay

## 2022-01-10 VITALS — BP 124/80 | HR 80 | Ht 62.25 in | Wt 161.0 lb

## 2022-01-10 DIAGNOSIS — N816 Rectocele: Secondary | ICD-10-CM

## 2022-01-10 DIAGNOSIS — R319 Hematuria, unspecified: Secondary | ICD-10-CM

## 2022-01-10 DIAGNOSIS — N8110 Cystocele, unspecified: Secondary | ICD-10-CM

## 2022-01-10 DIAGNOSIS — R350 Frequency of micturition: Secondary | ICD-10-CM | POA: Diagnosis not present

## 2022-01-10 DIAGNOSIS — N993 Prolapse of vaginal vault after hysterectomy: Secondary | ICD-10-CM

## 2022-01-10 LAB — URINALYSIS, ROUTINE W REFLEX MICROSCOPIC
Bilirubin Urine: NEGATIVE
Glucose, UA: NEGATIVE mg/dL
Ketones, ur: NEGATIVE mg/dL
Leukocytes,Ua: NEGATIVE
Nitrite: NEGATIVE
Protein, ur: NEGATIVE mg/dL
Specific Gravity, Urine: 1.015 (ref 1.005–1.030)
pH: 6 (ref 5.0–8.0)

## 2022-01-10 LAB — URINALYSIS, MICROSCOPIC (REFLEX)

## 2022-01-10 NOTE — Progress Notes (Signed)
[facility] New Patient Evaluation and Consultation  Referring Provider: Cheri Fowler, MD PCP: Burnard Bunting, MD Date of Service: 01/10/2022  SUBJECTIVE Chief Complaint: New Patient (Initial Visit)  History of Present Illness: NELTA DUNSFORD is a 66 y.o. White or Caucasian female seen in consultation at the request of Dr. Willis Modena for evaluation of prolapse.    Review of records from Dr Willis Modena significant for: On 05/30/21- had A/P repair. Then had recurrent vaginal vault prolapse and underwent SSLF on 10/19/21  Urinary Symptoms: Leaks urine with lifting, with movement to the bathroom, and with urgency Leaks 2 time(s) per day  Pad use: 2 pads per day.   She is bothered by her UI symptoms.  Day time voids 20.  Nocturia: 2 times per night to void. Voiding dysfunction: she empties her bladder well.  does not use a catheter to empty bladder.  When urinating, she feels a weak stream, difficulty starting urine stream, dribbling after finishing, and the need to urinate multiple times in a row Drinks: 2- 16oz pepsi, 1 cup coffee, 1 cup water per day  UTIs:  0  UTI's in the last year.   Denies history of blood in urine and kidney or bladder stones  Pelvic Organ Prolapse Symptoms:                  She Admits to a feeling of a bulge the vaginal area. It has been present since Nov 2022 She Admits to seeing a bulge.  This bulge is bothersome. Previously used a pessary then had two prior surgeries (A/P and SSLF)  Bowel Symptom: Bowel movements: 2 time(s) per day Stool consistency: soft  Straining: yes.  Splinting: yes.  Incomplete evacuation: yes.  She Denies accidental bowel leakage / fecal incontinence Bowel regimen: miralax Last colonoscopy: Date 2017, Results- negative  Sexual Function Sexually active: no.   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    Dyspnea    sob with heavy activity   Headache    Pain in limb    Primary localized osteoarthrosis, lower leg    oa   Wears dentures top   Wears glasses      Past Surgical History:   Past Surgical History:  Procedure Laterality Date   ABDOMINAL HYSTERECTOMY  yrs ago   1 ovary left   ANTERIOR AND POSTERIOR REPAIR N/A 05/30/2021   Procedure: ANTERIOR (CYSTOCELE) AND POSTERIOR REPAIR (RECTOCELE);  Surgeon: Cheri Fowler, MD;  Location: [facility];  Service: Gynecology;  Laterality: N/A;   BACK SURGERY  yrs ago   x3 bolts  and screws and rods x 2 lower back   HEMORRHOID SURGERY  yrs ago   VAGINAL PROLAPSE REPAIR N/A 10/19/2021   Procedure: VAGINAL VAULT SUSPENSION;  Surgeon: Cheri Fowler, MD;  Location: [place];  Service: Gynecology;  Laterality: N/A;   VULVAR LESION REMOVAL N/A 05/30/2021   Procedure: REMOVAL OF VULVAR CYST;  Surgeon: Cheri Fowler, MD;  Location: [facility];  Service: Gynecology;  Laterality: N/A;     Past OB/GYN History: OB History  Gravida Para Term Preterm AB Living  2 2 2     2   SAB IAB Ectopic Multiple Live Births          2    # Outcome Date GA Lbr Len/2nd Weight Sex Delivery Anes PTL Lv  2 Term           1 Term            S/p hysterectomy   Medications: She has a current medication list which includes the following prescription(s): alprazolam, ascorbic acid, aspirin ec, d3 maximum strength, and tramadol.   Allergies: Patient is allergic to bee venom, arthrotec [diclofenac-misoprostol], codeine, cymbalta [duloxetine hcl], effexor [venlafaxine hydrochloride], escitalopram oxalate, hydrocodone, neurontin [gabapentin], oxymorphone hcl, pentazocine lactate, provigil [modafinil], and sertraline hcl.   Social History:  Social History   Tobacco Use   Smoking status: Every Day    Packs/day: 1.50    Years: 40.00    Pack years: 60.00    Types: Cigarettes   Smokeless tobacco: Never  Vaping Use    Vaping Use: Never used  Substance Use Topics   Alcohol use: No   Drug use: No    Relationship status: married She lives with husband and daughter (disabled).   She works as a housewife.  Regular exercise: Yes: walking History of abuse: No  Family History:   Family History  Problem Relation Age of Onset   Stroke Mother    Hypertension Father    Breast cancer Paternal Aunt      Review of Systems: Review of Systems  Constitutional:  Negative for fever, malaise/fatigue and weight loss.  Respiratory:  Negative for cough, shortness of breath and wheezing.   Cardiovascular:  Negative for chest pain, palpitations and leg swelling.  Gastrointestinal:  Negative for abdominal pain and blood in stool.  Genitourinary:  Negative for dysuria.       + vaginal discharge  Musculoskeletal:  Negative for myalgias.  Skin:  Negative for rash.  Neurological:  Positive for headaches. Negative for dizziness.  Endo/Heme/Allergies:  Does not bruise/bleed easily.  Psychiatric/Behavioral:  Positive for depression. The patient is nervous/anxious.     OBJECTIVE Physical Exam: Vitals:   01/10/22 1522  BP: 124/80  Pulse: 80  Weight: 161 lb (73 kg)  Height: 5' 2.25" (1.581 m)    Physical Exam Constitutional:      General: She is not in acute distress. Pulmonary:     Effort: Pulmonary effort is normal.  Abdominal:     General: There is no distension.     Palpations: Abdomen is soft.     Tenderness: There is no abdominal tenderness. There is no rebound.     Comments: + pfannenstiel incision  Musculoskeletal:        General: No swelling. Normal range of motion.  Skin:    General: Skin is warm and dry.     Findings: No rash.  Neurological:     Mental Status: She is alert and oriented to person, place, and time.  Psychiatric:        Mood and Affect: Mood normal.        Behavior: Behavior normal.     GU / Detailed Urogynecologic Evaluation:  Pelvic Exam: Normal external female genitalia;  Bartholin's and Skene's glands normal in appearance; urethral meatus normal in appearance, no urethral masses or discharge.   CST: negative  s/p hysterectomy: Speculum exam reveals normal vaginal mucosa with  atrophy and normal vaginal cuff.  Adnexa no mass, fullness, tenderness.     Pelvic floor strength II/V  Pelvic floor musculature: Right levator non-tender, Right obturator non-tender, Left levator non-tender, Left obturator non-tender  POP-Q:   POP-Q  3  Aa   4.5                                           Ba  5                                              C   5                                            Gh  2.5                                            Pb  5.5                                            tvl   3                                            Ap  4.5                                            Bp                                                 D     Rectal Exam:  Normal external rectum  Post-Void Residual (PVR) by Bladder Scan: In order to evaluate bladder emptying, we discussed obtaining a postvoid residual and she agreed to this procedure.  Procedure: The ultrasound unit was placed on the patient's abdomen in the suprapubic region after the patient had voided. A PVR of 0 ml was obtained by bladder scan.  Laboratory Results: POC urine: small blood   ASSESSMENT AND PLAN Ms. Brooking is a 66 y.o. with:  1. Vaginal vault prolapse after hysterectomy   2. Prolapse of anterior vaginal wall   3. Prolapse of posterior vaginal wall   4. Hematuria, unspecified type   5. Urinary frequency    Stage IV anterior, Stage IV posterior, Stage IV apical prolapse - For treatment of pelvic organ prolapse, we discussed options for management including expectant management, conservative management, and surgical management, such as Kegels, a pessary, pelvic floor physical therapy, and specific surgical procedures. - She is  interested in surgery. Since she has failed two native tissue prolapse repairs, would recommend sacrocolpopexy. We discussed risks of mesh erosion and poor wound healing in smokers and recommended that she stop smoking prior to the procedure.  - We discussed the risk of occult incontinence but she did not have any urinary leakage with the  two prior prolapse repairs or with a pessary in place, so she would be low risk for occult incontinence. Therefore will defer urodynamic testing.  - Wants pessary prior to surgery for comfort.   2. Blood in urine - will send for micro UA and culture to conform presence of blood in urine. If positive, discussed need for further workup with cysto and upper tract imaging.   Return for pessary fitting and will work on surgery scheduling.   Jaquita Folds, MD

## 2022-01-10 NOTE — Patient Instructions (Signed)

## 2022-01-11 LAB — POCT URINALYSIS DIPSTICK
Bilirubin, UA: NEGATIVE
Glucose, UA: NEGATIVE
Ketones, UA: NEGATIVE
Leukocytes, UA: NEGATIVE
Nitrite, UA: NEGATIVE
Protein, UA: NEGATIVE
Spec Grav, UA: 1.015 (ref 1.010–1.025)
Urobilinogen, UA: 0.2 E.U./dL
pH, UA: 5.5 (ref 5.0–8.0)

## 2022-01-11 LAB — URINE CULTURE: Culture: NO GROWTH

## 2022-01-24 ENCOUNTER — Other Ambulatory Visit: Payer: Self-pay

## 2022-01-24 ENCOUNTER — Ambulatory Visit (INDEPENDENT_AMBULATORY_CARE_PROVIDER_SITE_OTHER): Payer: Medicare Other | Admitting: Obstetrics and Gynecology

## 2022-01-24 ENCOUNTER — Encounter: Payer: Self-pay | Admitting: Obstetrics and Gynecology

## 2022-01-24 VITALS — BP 130/81 | HR 94

## 2022-01-24 DIAGNOSIS — N993 Prolapse of vaginal vault after hysterectomy: Secondary | ICD-10-CM

## 2022-01-24 DIAGNOSIS — Z01818 Encounter for other preprocedural examination: Secondary | ICD-10-CM

## 2022-01-24 MED ORDER — ACETAMINOPHEN 500 MG PO TABS: 500.0000 mg | ORAL_TABLET | Freq: Four times a day (QID) | ORAL | 0 refills | Status: DC | PRN

## 2022-01-24 MED ORDER — IBUPROFEN 600 MG PO TABS: 600.0000 mg | ORAL_TABLET | Freq: Four times a day (QID) | ORAL | 0 refills | Status: DC | PRN

## 2022-01-24 MED ORDER — HYDROMORPHONE HCL 2 MG PO TABS: 2.0000 mg | ORAL_TABLET | ORAL | 0 refills | Status: DC | PRN

## 2022-01-24 MED ORDER — POLYETHYLENE GLYCOL 3350 17 GM/SCOOP PO POWD: 17.0000 g | Freq: Every day | ORAL | 0 refills | Status: DC

## 2022-01-24 NOTE — H&P (Signed)
Urogynecology Pre-Operative H&P  Subjective Chief Complaint: Alyssa [name] presents for a preoperative encounter.   History of Present Illness: Alyssa [name] is a 66 y.o. female who presents for preoperative visit.  She is scheduled to undergo Robotic sacrocolpopexy, cystoscopy on 02/13/21.  Her symptoms include vaginal bulge, and she was was found to have Stage IV anterior, Stage IV posterior, Stage IV apical prolapse.   She has cut back on smoking but has not quit completely.   She had 2 prior prolapse repairs without leakage- did not undergo urodynamics.   Past Medical History:  Diagnosis Date   Bright's disease age 19   issue resolved   Dyspnea    sob with heavy activity   Headache    Pain in limb    Primary localized osteoarthrosis, lower leg    oa   Wears dentures top   Wears glasses      Past Surgical History:  Procedure Laterality Date   ABDOMINAL HYSTERECTOMY  yrs ago   1 ovary left   ANTERIOR AND POSTERIOR REPAIR N/A 05/30/2021   Procedure: ANTERIOR (CYSTOCELE) AND POSTERIOR REPAIR (RECTOCELE);  Surgeon: Cheri Fowler, MD;  Location: [facility];  Service: Gynecology;  Laterality: N/A;   BACK SURGERY  yrs ago   x3 bolts  and screws and rods x 2 lower back   HEMORRHOID SURGERY  yrs ago   VAGINAL PROLAPSE REPAIR N/A 10/19/2021   Procedure: VAGINAL VAULT SUSPENSION;  Surgeon: Cheri Fowler, MD;  Location: [place];  Service: Gynecology;  Laterality: N/A;   VULVAR LESION REMOVAL N/A 05/30/2021   Procedure: REMOVAL OF VULVAR CYST;  Surgeon: Cheri Fowler, MD;  Location: [place];  Service: Gynecology;  Laterality: N/A;    is allergic to bee venom, arthrotec [diclofenac-misoprostol], codeine, cymbalta [duloxetine hcl], effexor [venlafaxine hydrochloride], escitalopram oxalate, hydrocodone, neurontin [gabapentin], oxymorphone hcl, pentazocine lactate, provigil [modafinil], and sertraline hcl.    Family History  Problem Relation Age of Onset   Stroke Mother    Hypertension Father    Breast cancer Paternal Aunt     Social History   Tobacco Use   Smoking status: Every Day    Packs/day: 1.50    Years: 40.00    Pack years: 60.00    Types: Cigarettes   Smokeless tobacco: Never  Vaping Use   Vaping Use: Never used  Substance Use Topics   Alcohol use: No   Drug use: No     Review of Systems was negative for a full 10 system review except as noted in the History of Present Illness.  No current facility-administered medications for this encounter.  Current Outpatient Medications:    acetaminophen (TYLENOL) 500 MG tablet, Take 1 tablet (500 mg total) by mouth every 6 (six) hours as needed (pain)., Disp: 30 tablet, Rfl: 0   ALPRAZolam (XANAX) 1 MG tablet, Take 1 mg by mouth at bedtime as needed for anxiety., Disp: , Rfl:    ascorbic acid (VITAMIN C) 500 MG tablet, Take 500 mg by mouth daily., Disp: , Rfl:    aspirin EC 81 MG tablet, Take 81 mg by mouth daily. Swallow whole., Disp: , Rfl:    Cholecalciferol (D3 MAXIMUM STRENGTH) 125 MCG (5000 UT) capsule, Take 5,000 Units by mouth daily., Disp: , Rfl:    HYDROmorphone (DILAUDID) 2 MG tablet, Take 1 tablet (2 mg total) by mouth every 4 (four) hours as needed for severe pain., Disp: 15 tablet, Rfl: 0  ibuprofen (ADVIL) 600 MG tablet, Take 1 tablet (600 mg total) by mouth every 6 (six) hours as needed., Disp: 30 tablet, Rfl: 0   polyethylene glycol powder (GLYCOLAX/MIRALAX) 17 GM/SCOOP powder, Take 17 g by mouth daily. Drink 17g (1 scoop) dissolved in water per day., Disp: 255 g, Rfl: 0   pregabalin (LYRICA) 100 MG capsule, Take 100 mg by mouth 3 (three) times daily., Disp: , Rfl:    traMADol (ULTRAM) 50 MG tablet, Take 2 tablets (100 mg total) by mouth every 8 (eight) hours as needed. (Patient taking differently: Take 100 mg by mouth 3 (three) times daily.), Disp: 180 tablet, Rfl: 0   Objective There were no vitals filed for  this visit.   Gen: NAD CV: S1 S2 RRR Lungs: Clear to auscultation bilaterally Abd: soft, nontender   Previous Pelvic Exam showed: POP-Q:    POP-Q   3                                            Aa   4.5                                           Ba   5                                              C    5                                            Gh   2.5                                            Pb   5.5                                            tvl    3                                            Ap   4.5                                            Bp                                                  D       Assessment/ Plan  Assessment: The patient is a 66 y.o. year old with stage IV  POP scheduled to undergo robotic sacrocolpopexy, cystoscopy.    Jaquita Folds, MD

## 2022-01-24 NOTE — Progress Notes (Signed)
[facility] Pre-Operative visit  Subjective Chief Complaint: Alyssa [name] presents for a preoperative encounter.   History of Present Illness: Alyssa [name] is a 66 y.o. female who presents for preoperative visit.  She is scheduled to undergo Robotic sacrocolpopexy, cystoscopy on 02/13/21.  Her symptoms include vaginal bulge, and she was was found to have Stage IV anterior, Stage IV posterior, Stage IV apical prolapse.   She has cut back on smoking but has not quit completely.   She had 2 prior prolapse repairs without leakage- did not undergo urodynamics.   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   Dyspnea    sob with heavy activity   Headache    Pain in limb    Primary localized osteoarthrosis, lower leg    oa   Wears dentures top   Wears glasses      Past Surgical History:  Procedure Laterality Date   ABDOMINAL HYSTERECTOMY  yrs ago   1 ovary left   ANTERIOR AND POSTERIOR REPAIR N/A 05/30/2021   Procedure: ANTERIOR (CYSTOCELE) AND POSTERIOR REPAIR (RECTOCELE);  Surgeon: Cheri Fowler, MD;  Location: [facility];  Service: Gynecology;  Laterality: N/A;   BACK SURGERY  yrs ago   x3 bolts  and screws and rods x 2 lower back   HEMORRHOID SURGERY  yrs ago   VAGINAL PROLAPSE REPAIR N/A 10/19/2021   Procedure: VAGINAL VAULT SUSPENSION;  Surgeon: Cheri Fowler, MD;  Location: [facility];  Service: Gynecology;  Laterality: N/A;   VULVAR LESION REMOVAL N/A 05/30/2021   Procedure: REMOVAL OF VULVAR CYST;  Surgeon: Cheri Fowler, MD;  Location: [facility];  Service: Gynecology;  Laterality: N/A;    is allergic to bee venom, arthrotec [diclofenac-misoprostol], codeine, cymbalta [duloxetine hcl], effexor [venlafaxine hydrochloride], escitalopram oxalate, hydrocodone, neurontin [gabapentin], oxymorphone hcl, pentazocine lactate, provigil [modafinil], and sertraline hcl.    Family History  Problem Relation Age of Onset   Stroke Mother    Hypertension Father    Breast cancer Paternal Aunt     Social History   Tobacco Use   Smoking status: Every Day    Packs/day: 1.50    Years: 40.00    Pack years: 60.00    Types: Cigarettes   Smokeless tobacco: Never  Vaping Use   Vaping Use: Never used  Substance Use Topics   Alcohol use: No   Drug use: No     Review of Systems was negative for a full 10 system review except as noted in the History of Present Illness.   Current Outpatient Medications:    ALPRAZolam (XANAX) 1 MG tablet, Take 1 mg by mouth at bedtime as needed for anxiety., Disp: , Rfl:    ascorbic acid (VITAMIN C) 500 MG tablet, Take 500 mg by mouth daily., Disp: , Rfl:    aspirin EC 81 MG tablet, Take 81 mg by mouth daily. Swallow whole., Disp: , Rfl:    Cholecalciferol (D3 MAXIMUM STRENGTH) 125 MCG (5000 UT) capsule, Take 5,000 Units by mouth daily., Disp: , Rfl:    pregabalin (LYRICA) 100 MG capsule, Take 100 mg by mouth 3 (three) times daily., Disp: , Rfl:    traMADol (ULTRAM) 50 MG tablet, Take 2 tablets (100 mg total) by mouth every 8 (eight) hours as needed. (Patient taking differently: Take 100 mg by mouth 3 (three) times daily.), Disp: 180 tablet, Rfl: 0   Objective Vitals:   01/24/22  1519  BP: 130/81  Pulse: 94    Gen: NAD CV: S1 S2 RRR Lungs: Clear to auscultation bilaterally Abd: soft, nontender   Previous Pelvic Exam showed: POP-Q:    POP-Q   3                                            Aa   4.5                                           Ba   5                                              C    5                                            Gh   2.5                                            Pb   5.5                                            tvl    3                                            Ap   4.5                                            Bp                                                  D        Assessment/ Plan  Assessment: The patient is a 66 y.o. year old scheduled to undergo robotic sacrocolpopexy, cystoscopy. Verbal consent was obtained for these procedures.  Plan: General Surgical Consent: The patient has previously been counseled on alternative treatments, and the decision by the patient and provider was to proceed with the procedure listed above.  For all procedures, there are risks of bleeding, infection, damage to surrounding organs including but not limited to bowel, bladder, blood vessels, ureters and nerves, and need for further surgery if an injury were to occur. These risks are all low with minimally invasive surgery.   There are risks of numbness and weakness at any body site or buttock/rectal pain.  It is possible that baseline pain can be worsened by surgery,  either with or without mesh. If surgery is vaginal, there is also a low risk of possible conversion to laparoscopy or open abdominal incision where indicated. Very rare risks include blood transfusion, blood clot, heart attack, pneumonia, or death.   There is also a risk of short-term postoperative urinary retention with need to use a catheter. About half of patients need to go home from surgery with a catheter, which is then later removed in the office. The risk of long-term need for a catheter is very low. There is also a risk of worsening of overactive bladder.     Prolapse (with or without mesh): Risk factors for surgical failure  include things that put pressure on your pelvis and the surgical repair, including obesity, chronic cough, and heavy lifting or straining (including lifting children or adults, straining on the toilet, or lifting heavy objects such as furniture or anything weighing >25 lbs. Risks of recurrence is 20-30% with vaginal native tissue repair and a less than 10% with sacrocolpopexy with mesh.    Sacrocolpopexy: Mesh implants may provide more prolapse support, but do have some unique  risks to consider. It is important to understand that mesh is permanent and cannot be easily removed. Risks of abdominal sacrocolpopexy mesh include mesh exposure (~3-6%), painful intercourse (recent studies show lower rates after surgery compared to before, with ~5-8% risk of new onset), and very rare risks of bowel or bladder injury or infection (<1%). The risk of mesh exposure is more likely in a woman with risks for poor healing (prior radiation, poorly controlled diabetes, or immunocompromised). The risk of new or worsened chronic pain after mesh implant is more common in women with baseline chronic pain and/or poorly controlled anxiety or depression. There is an FDA safety notification on vaginal mesh procedures for prolapse but NOT abdominal mesh procedures and therefore does not apply to your surgery. We have extensive experience and training with mesh placement and we have close postoperative follow up to identify any potential complications from mesh.    We discussed consent for blood products. Risks for blood transfusion include allergic reactions, other reactions that can affect different body organs and managed accordingly, transmission of infectious diseases such as HIV or Hepatitis. However, the blood is screened. Patient consents for blood products.  We also discussed risks of smoking and poor wound healing including increased risk of mesh erosion and surgical failure.   Pre-operative instructions:  She was instructed to not take Aspirin/NSAIDs x 7days prior to surgery. She may continue her 81mg  ASA. Antibiotic prophylaxis was ordered as indicated.  Catheter use: Patient will go home with foley if needed after post-operative voiding trial.  Post-operative instructions:  She was provided with specific post-operative instructions, including precautions and signs/symptoms for which we would recommend contacting us, in addition to daytime and after-hours contact phone numbers. This was provided  on a handout.   Post-operative medications: Prescriptions for motrin, tylenol, miralax, and dilaudid were sent to her pharmacy. Discussed using ibuprofen and tylenol on a schedule to limit use of narcotics.   Laboratory testing:  Will get type and screen pre op    Preoperative clearance:  She does not require surgical clearance.    Post-operative follow-up:  A post-operative appointment will be made for 6 weeks from the date of surgery. If she needs a post-operative nurse visit for a voiding trial, that will be set up after she leaves the hospital.    Patient will call the clinic or use MyChart should anything  change or any new issues arise.   Jaquita Folds, MD

## 2022-01-29 ENCOUNTER — Telehealth: Payer: Self-pay | Admitting: Obstetrics and Gynecology

## 2022-01-29 ENCOUNTER — Encounter: Payer: Self-pay | Admitting: Obstetrics and Gynecology

## 2022-01-31 ENCOUNTER — Other Ambulatory Visit: Payer: Self-pay | Admitting: Internal Medicine

## 2022-01-31 DIAGNOSIS — Z720 Tobacco use: Secondary | ICD-10-CM

## 2022-02-01 NOTE — Telephone Encounter (Signed)
I was able to contact the patient and she said she did not want a pessary and that she will be alright waiting until the surgery

## 2022-02-06 ENCOUNTER — Encounter (HOSPITAL_BASED_OUTPATIENT_CLINIC_OR_DEPARTMENT_OTHER): Payer: Self-pay | Admitting: Obstetrics and Gynecology

## 2022-02-06 ENCOUNTER — Ambulatory Visit
Admission: RE | Admit: 2022-02-06 | Discharge: 2022-02-06 | Disposition: A | Discharge status: Home / Self Care | Payer: Medicare Other | Source: Ambulatory Visit | Attending: Internal Medicine | Admitting: Internal Medicine

## 2022-02-06 ENCOUNTER — Other Ambulatory Visit: Payer: Self-pay

## 2022-02-06 DIAGNOSIS — Z720 Tobacco use: Secondary | ICD-10-CM

## 2022-02-06 NOTE — Progress Notes (Signed)
Spoke w/ via phone for pre-op interview---patient Lab needs dos----  T&S             Lab results------NA COVID test -----patient states asymptomatic no test needed. Arrive at -------09:30 a 02/13/22 NPO after MN NO Solid Food.  Clear liquids from MN until---08:30am Med rec completed. Medications to take morning of surgery -----Lyrica and Tramadol PRN. Follow surgeon instructions regarding holding aspirin. Diabetic medication -----NA Patient instructed no nail polish to be worn day of surgery Patient instructed to bring photo id and insurance card day of surgery Patient aware to have Driver (ride ) / caregiver    for 24 hours after surgery  Patient Special Instructions -----no smoking 24 hours before sx Pre-Op special Istructions -----NA Patient verbalized understanding of instructions that were given at this phone interview. Patient denies shortness of breath, chest pain, fever, cough at this phone interview.

## 2022-02-13 ENCOUNTER — Encounter (HOSPITAL_BASED_OUTPATIENT_CLINIC_OR_DEPARTMENT_OTHER)
Admission: RE | Disposition: A | Discharge status: Home / Self Care | Payer: Self-pay | Source: Home / Self Care | Attending: Obstetrics and Gynecology

## 2022-02-13 ENCOUNTER — Other Ambulatory Visit: Payer: Self-pay

## 2022-02-13 ENCOUNTER — Ambulatory Visit (HOSPITAL_BASED_OUTPATIENT_CLINIC_OR_DEPARTMENT_OTHER): Payer: Medicare Other | Admitting: Anesthesiology

## 2022-02-13 ENCOUNTER — Ambulatory Visit (HOSPITAL_BASED_OUTPATIENT_CLINIC_OR_DEPARTMENT_OTHER)
Admission: RE | Admit: 2022-02-13 | Discharge: 2022-02-13 | Disposition: A | Discharge status: Home / Self Care | Payer: Medicare Other | Attending: Obstetrics and Gynecology | Admitting: Obstetrics and Gynecology

## 2022-02-13 ENCOUNTER — Telehealth: Payer: Self-pay | Admitting: Obstetrics and Gynecology

## 2022-02-13 ENCOUNTER — Encounter (HOSPITAL_BASED_OUTPATIENT_CLINIC_OR_DEPARTMENT_OTHER): Payer: Self-pay | Admitting: Obstetrics and Gynecology

## 2022-02-13 DIAGNOSIS — K660 Peritoneal adhesions (postprocedural) (postinfection): Secondary | ICD-10-CM | POA: Diagnosis not present

## 2022-02-13 DIAGNOSIS — N993 Prolapse of vaginal vault after hysterectomy: Secondary | ICD-10-CM | POA: Diagnosis present

## 2022-02-13 DIAGNOSIS — J449 Chronic obstructive pulmonary disease, unspecified: Secondary | ICD-10-CM

## 2022-02-13 DIAGNOSIS — N819 Female genital prolapse, unspecified: Secondary | ICD-10-CM

## 2022-02-13 DIAGNOSIS — N736 Female pelvic peritoneal adhesions (postinfective): Secondary | ICD-10-CM | POA: Diagnosis not present

## 2022-02-13 DIAGNOSIS — Z90710 Acquired absence of both cervix and uterus: Secondary | ICD-10-CM | POA: Diagnosis not present

## 2022-02-13 DIAGNOSIS — F17210 Nicotine dependence, cigarettes, uncomplicated: Secondary | ICD-10-CM | POA: Diagnosis not present

## 2022-02-13 DIAGNOSIS — M1990 Unspecified osteoarthritis, unspecified site: Secondary | ICD-10-CM | POA: Insufficient documentation

## 2022-02-13 DIAGNOSIS — Z01818 Encounter for other preprocedural examination: Secondary | ICD-10-CM

## 2022-02-13 HISTORY — PX: ROBOTIC ASSISTED LAPAROSCOPIC SACROCOLPOPEXY: SHX5388

## 2022-02-13 LAB — TYPE AND SCREEN
ABO/RH(D): A POS
Antibody Screen: NEGATIVE

## 2022-02-13 SURGERY — SACROCOLPOPEXY, ROBOT-ASSISTED, LAPAROSCOPIC
Anesthesia: General | Site: Abdomen

## 2022-02-13 MED ORDER — MIDAZOLAM HCL 5 MG/5ML IJ SOLN: INTRAMUSCULAR | Status: DC | PRN

## 2022-02-13 MED ORDER — ACETAMINOPHEN 500 MG PO TABS
ORAL_TABLET | ORAL | Status: AC
  Filled 2022-02-13: qty 2

## 2022-02-13 MED ORDER — HYDROMORPHONE HCL 1 MG/ML IJ SOLN
INTRAMUSCULAR | Status: AC
  Filled 2022-02-13: qty 1

## 2022-02-13 MED ORDER — CEFAZOLIN SODIUM-DEXTROSE 2-4 GM/100ML-% IV SOLN
INTRAVENOUS | Status: AC
  Filled 2022-02-13: qty 100

## 2022-02-13 MED ORDER — POVIDONE-IODINE 10 % EX SWAB
2.0000 | Freq: Once | CUTANEOUS | Status: DC
Start: 2022-02-13 — End: 2022-02-13

## 2022-02-13 MED ORDER — PHENAZOPYRIDINE HCL 100 MG PO TABS
200.0000 mg | ORAL_TABLET | ORAL | Status: AC
  Administered 2022-02-13: 200 mg via ORAL

## 2022-02-13 MED ORDER — CEFAZOLIN SODIUM-DEXTROSE 2-4 GM/100ML-% IV SOLN
2.0000 g | INTRAVENOUS | Status: AC
  Administered 2022-02-13: 2 g via INTRAVENOUS

## 2022-02-13 MED ORDER — OXYCODONE HCL 5 MG/5ML PO SOLN: 5.0000 mg | Freq: Once | ORAL | Status: DC | PRN

## 2022-02-13 MED ORDER — LACTATED RINGERS IV SOLN: INTRAVENOUS | Status: DC

## 2022-02-13 MED ORDER — WATER FOR IRRIGATION, STERILE IR SOLN
Status: DC | PRN
  Administered 2022-02-13: 500 mL via TOPICAL

## 2022-02-13 MED ORDER — PROPOFOL 10 MG/ML IV BOLUS
INTRAVENOUS | Status: DC | PRN
  Administered 2022-02-13: 130 mg via INTRAVENOUS
  Administered 2022-02-13: 50 mg via INTRAVENOUS

## 2022-02-13 MED ORDER — LIDOCAINE HCL (CARDIAC) PF 100 MG/5ML IV SOSY
PREFILLED_SYRINGE | INTRAVENOUS | Status: DC | PRN
Start: 2022-02-13 — End: 2022-02-13
  Administered 2022-02-13: 40 mg via INTRAVENOUS

## 2022-02-13 MED ORDER — MEPERIDINE HCL 25 MG/ML IJ SOLN: 6.2500 mg | INTRAMUSCULAR | Status: DC | PRN

## 2022-02-13 MED ORDER — ROCURONIUM BROMIDE 10 MG/ML (PF) SYRINGE
PREFILLED_SYRINGE | INTRAVENOUS | Status: AC
  Filled 2022-02-13: qty 10

## 2022-02-13 MED ORDER — HEMOSTATIC AGENTS (NO CHARGE) OPTIME
TOPICAL | Status: DC | PRN
  Administered 2022-02-13: 2 via TOPICAL

## 2022-02-13 MED ORDER — MIDAZOLAM HCL 2 MG/2ML IJ SOLN
INTRAMUSCULAR | Status: AC
  Filled 2022-02-13: qty 2

## 2022-02-13 MED ORDER — SODIUM CHLORIDE 0.9 % IR SOLN
Status: DC | PRN
  Administered 2022-02-13: 1000 mL

## 2022-02-13 MED ORDER — ONDANSETRON HCL 4 MG/2ML IJ SOLN
INTRAMUSCULAR | Status: AC
  Filled 2022-02-13: qty 2

## 2022-02-13 MED ORDER — SCOPOLAMINE 1 MG/3DAYS TD PT72
1.0000 | MEDICATED_PATCH | TRANSDERMAL | Status: DC
  Administered 2022-02-13: 1.5 mg via TRANSDERMAL

## 2022-02-13 MED ORDER — HYDROMORPHONE HCL 1 MG/ML IJ SOLN
0.2500 mg | INTRAMUSCULAR | Status: DC | PRN
  Administered 2022-02-13 (×4): 0.5 mg via INTRAVENOUS

## 2022-02-13 MED ORDER — SUGAMMADEX SODIUM 200 MG/2ML IV SOLN
INTRAVENOUS | Status: DC | PRN
  Administered 2022-02-13: 200 mg via INTRAVENOUS

## 2022-02-13 MED ORDER — EPHEDRINE SULFATE (PRESSORS) 50 MG/ML IJ SOLN
INTRAMUSCULAR | Status: DC | PRN
  Administered 2022-02-13 (×2): 10 mg via INTRAVENOUS

## 2022-02-13 MED ORDER — PROMETHAZINE HCL 25 MG/ML IJ SOLN: 6.2500 mg | INTRAMUSCULAR | Status: DC | PRN

## 2022-02-13 MED ORDER — DEXAMETHASONE SODIUM PHOSPHATE 4 MG/ML IJ SOLN
INTRAMUSCULAR | Status: DC | PRN
  Administered 2022-02-13: 5 mg via INTRAVENOUS

## 2022-02-13 MED ORDER — KETOROLAC TROMETHAMINE 30 MG/ML IJ SOLN
INTRAMUSCULAR | Status: DC | PRN
Start: 2022-02-13 — End: 2022-02-13
  Administered 2022-02-13: 30 mg via INTRAVENOUS

## 2022-02-13 MED ORDER — MIDAZOLAM HCL 2 MG/2ML IJ SOLN: 0.5000 mg | Freq: Once | INTRAMUSCULAR | Status: DC | PRN

## 2022-02-13 MED ORDER — FENTANYL CITRATE (PF) 100 MCG/2ML IJ SOLN
INTRAMUSCULAR | Status: DC | PRN
Start: 2022-02-13 — End: 2022-02-13
  Administered 2022-02-13 (×2): 50 ug via INTRAVENOUS
  Administered 2022-02-13: 100 ug via INTRAVENOUS
  Administered 2022-02-13: 50 ug via INTRAVENOUS

## 2022-02-13 MED ORDER — ROCURONIUM BROMIDE 100 MG/10ML IV SOLN
INTRAVENOUS | Status: DC | PRN
  Administered 2022-02-13: 20 mg via INTRAVENOUS
  Administered 2022-02-13: 60 mg via INTRAVENOUS

## 2022-02-13 MED ORDER — OXYCODONE HCL 5 MG PO TABS: 5.0000 mg | ORAL_TABLET | Freq: Once | ORAL | Status: DC | PRN

## 2022-02-13 MED ORDER — KETOROLAC TROMETHAMINE 30 MG/ML IJ SOLN
INTRAMUSCULAR | Status: AC
  Filled 2022-02-13: qty 1

## 2022-02-13 MED ORDER — FENTANYL CITRATE (PF) 250 MCG/5ML IJ SOLN
INTRAMUSCULAR | Status: AC
  Filled 2022-02-13: qty 5

## 2022-02-13 MED ORDER — PHENAZOPYRIDINE HCL 100 MG PO TABS
ORAL_TABLET | ORAL | Status: AC
  Filled 2022-02-13: qty 2

## 2022-02-13 MED ORDER — DEXAMETHASONE SODIUM PHOSPHATE 10 MG/ML IJ SOLN
INTRAMUSCULAR | Status: AC
  Filled 2022-02-13: qty 1

## 2022-02-13 MED ORDER — ACETAMINOPHEN 500 MG PO TABS: 1000.0000 mg | ORAL_TABLET | Freq: Once | ORAL | Status: DC

## 2022-02-13 MED ORDER — ACETAMINOPHEN 500 MG PO TABS
1000.0000 mg | ORAL_TABLET | ORAL | Status: AC
  Administered 2022-02-13: 1000 mg via ORAL

## 2022-02-13 MED ORDER — EPHEDRINE 5 MG/ML INJ
INTRAVENOUS | Status: AC
  Filled 2022-02-13: qty 5

## 2022-02-13 MED ORDER — SCOPOLAMINE 1 MG/3DAYS TD PT72
MEDICATED_PATCH | TRANSDERMAL | Status: AC
  Filled 2022-02-13: qty 1

## 2022-02-13 MED ORDER — LIDOCAINE HCL (PF) 2 % IJ SOLN
INTRAMUSCULAR | Status: AC
  Filled 2022-02-13: qty 5

## 2022-02-13 MED ORDER — ONDANSETRON HCL 4 MG/2ML IJ SOLN
INTRAMUSCULAR | Status: DC | PRN
  Administered 2022-02-13: 4 mg via INTRAVENOUS

## 2022-02-13 MED ORDER — BUPIVACAINE HCL (PF) 0.25 % IJ SOLN
INTRAMUSCULAR | Status: DC | PRN
  Administered 2022-02-13: 20 mL

## 2022-02-13 MED ORDER — PROPOFOL 10 MG/ML IV BOLUS
INTRAVENOUS | Status: AC
  Filled 2022-02-13: qty 20

## 2022-02-13 SURGICAL SUPPLY — 69 items
ADH SKN CLS APL DERMABOND .7 (GAUZE/BANDAGES/DRESSINGS) ×2
APL PRP STRL LF DISP 70% ISPRP (MISCELLANEOUS) ×2
APL SRG 38 LTWT LNG FL B (MISCELLANEOUS) ×2
APPLICATOR ARISTA FLEXITIP XL (MISCELLANEOUS) ×2 IMPLANT
CATH FOLEY 3WAY  5CC 16FR (CATHETERS) ×3
CATH FOLEY 3WAY 5CC 16FR (CATHETERS) ×2 IMPLANT
CHLORAPREP W/TINT 26 (MISCELLANEOUS) ×3 IMPLANT
COVER BACK TABLE 60X90IN (DRAPES) ×3 IMPLANT
COVER TIP SHEARS 8 DVNC (MISCELLANEOUS) ×2 IMPLANT
COVER TIP SHEARS 8MM DA VINCI (MISCELLANEOUS) ×3
DECANTER SPIKE VIAL GLASS SM (MISCELLANEOUS) ×2 IMPLANT
DEFOGGER SCOPE WARMER CLEARIFY (MISCELLANEOUS) ×3 IMPLANT
DERMABOND ADVANCED (GAUZE/BANDAGES/DRESSINGS) ×1
DERMABOND ADVANCED .7 DNX12 (GAUZE/BANDAGES/DRESSINGS) ×2 IMPLANT
DISSECTOR BLUNT TIP ENDO 5MM (MISCELLANEOUS) ×2 IMPLANT
DRAPE ARM DVNC X/XI (DISPOSABLE) ×8 IMPLANT
DRAPE COLUMN DVNC XI (DISPOSABLE) ×2 IMPLANT
DRAPE DA VINCI XI ARM (DISPOSABLE) ×12
DRAPE DA VINCI XI COLUMN (DISPOSABLE) ×3
DRAPE SHEET LG 3/4 BI-LAMINATE (DRAPES) ×2 IMPLANT
DRAPE UTILITY XL STRL (DRAPES) ×3 IMPLANT
ELECT REM PT RETURN 9FT ADLT (ELECTROSURGICAL) ×3
ELECTRODE REM PT RTRN 9FT ADLT (ELECTROSURGICAL) ×2 IMPLANT
GAUZE 4X4 16PLY ~~LOC~~+RFID DBL (SPONGE) ×6 IMPLANT
GLOVE SURG ENC MOIS LTX SZ6 (GLOVE) ×12 IMPLANT
GLOVE SURG UNDER POLY LF SZ6.5 (GLOVE) ×15 IMPLANT
GOWN STRL REUS W/TWL LRG LVL3 (GOWN DISPOSABLE) ×3 IMPLANT
HEMOSTAT ARISTA ABSORB 3G PWDR (HEMOSTASIS) ×4 IMPLANT
HIBICLENS CHG 4% 4OZ BTL (MISCELLANEOUS) ×3 IMPLANT
HOLDER FOLEY CATH W/STRAP (MISCELLANEOUS) ×3 IMPLANT
IRRIG SUCT STRYKERFLOW 2 WTIP (MISCELLANEOUS) ×3
IRRIGATION SUCT STRKRFLW 2 WTP (MISCELLANEOUS) ×2 IMPLANT
IV NS 1000ML (IV SOLUTION) ×3
IV NS 1000ML BAXH (IV SOLUTION) ×1 IMPLANT
KIT TURNOVER CYSTO (KITS) ×3 IMPLANT
LEGGING LITHOTOMY PAIR STRL (DRAPES) ×3 IMPLANT
MANIFOLD NEPTUNE II (INSTRUMENTS) ×3 IMPLANT
MANIPULATOR ADVINCU DEL 2.5 PL (MISCELLANEOUS) IMPLANT
MANIPULATOR ADVINCU DEL 3.0 PL (MISCELLANEOUS) IMPLANT
MANIPULATOR ADVINCU DEL 3.5 PL (MISCELLANEOUS) IMPLANT
MANIPULATOR ADVINCU DEL 4.0 PL (MISCELLANEOUS) IMPLANT
MARKER SKIN DUAL TIP RULER LAB (MISCELLANEOUS) ×4 IMPLANT
NEEDLE INSUFFLATION 120MM (ENDOMECHANICALS) ×3 IMPLANT
OBTURATOR OPTICAL STANDARD 8MM (TROCAR) ×3
OBTURATOR OPTICAL STND 8 DVNC (TROCAR) ×2
OBTURATOR OPTICALSTD 8 DVNC (TROCAR) ×2 IMPLANT
PACK CYSTO (CUSTOM PROCEDURE TRAY) ×3 IMPLANT
PACK ROBOT WH (CUSTOM PROCEDURE TRAY) ×3 IMPLANT
PACK ROBOTIC GOWN (GOWN DISPOSABLE) ×3 IMPLANT
PAD OB MATERNITY 4.3X12.25 (PERSONAL CARE ITEMS) ×3 IMPLANT
PAD POSITIONING PINK XL (MISCELLANEOUS) ×3 IMPLANT
PAD PREP 24X48 CUFFED NSTRL (MISCELLANEOUS) ×3 IMPLANT
POUCH LAPAROSCOPIC INSTRUMENT (MISCELLANEOUS) IMPLANT
PROTECTOR NERVE ULNAR (MISCELLANEOUS) ×3 IMPLANT
SCISSORS LAP 5X35 DISP (ENDOMECHANICALS) ×2 IMPLANT
SEAL CANN UNIV 5-8 DVNC XI (MISCELLANEOUS) ×8 IMPLANT
SEAL XI 5MM-8MM UNIVERSAL (MISCELLANEOUS) ×12
SET IRRIG Y TYPE TUR BLADDER L (SET/KITS/TRAYS/PACK) ×3 IMPLANT
SET TUBE SMOKE EVAC HIGH FLOW (TUBING) ×3 IMPLANT
SPONGE T-LAP 4X18 ~~LOC~~+RFID (SPONGE) IMPLANT
SUT ABS MONO DBL WITH NDL 48IN (SUTURE) IMPLANT
SUT GORETEX NAB #0 THX26 36IN (SUTURE) ×2 IMPLANT
SUT MNCRL AB 4-0 PS2 18 (SUTURE) ×5 IMPLANT
SUT MON AB 2-0 SH 27 (SUTURE) ×3 IMPLANT
SUT VLOC 180 0 9IN  GS21 (SUTURE)
SUT VLOC 180 0 9IN GS21 (SUTURE) IMPLANT
SUT VLOC 180 2-0 9IN GS21 (SUTURE) ×6 IMPLANT
TOWEL OR 17X26 10 PK STRL BLUE (TOWEL DISPOSABLE) ×3 IMPLANT
TROCAR XCEL NON BLADE 8MM B8LT (ENDOMECHANICALS) ×3 IMPLANT

## 2022-02-13 NOTE — Discharge Instructions (Addendum)
POST OPERATIVE INSTRUCTIONS  General Instructions Recovery (not bed rest) will last approximately 2-3 weeks Walking is encouraged, but refrain from strenuous exercise/ housework/ heavy lifting for 2-3 weeks. No lifting >10lbs  Nothing in the vagina- NO intercourse, tampons or douching Bathing:  You can shower starting the day after surgery.  No driving until you are not taking narcotic pain medicine and until your pain is well enough controlled that you can slam on the breaks or make sudden movements if needed.   Taking your medications Please take your acetaminophen and ibuprofen on a schedule for the first 48 hours. Take 600mg  ibuprofen, then take 500mg  acetaminophen 3 hours later, then continue to alternate ibuprofen and acetaminophen. That way you are taking each type of medication every 6 hours. Take the prescribed narcotic (oxycodone, tramadol, etc) as needed, with a maximum being every 4 hours.  Take a stool softener daily to keep your stools soft and preventing you from straining. If you have diarrhea, you decrease your stool softener. This is explained more below. We have prescribed you Miralax.  Reasons to Call the Nurse (see last page for phone numbers) Heavy Bleeding (changing your pad every 1-2 hours) Persistent nausea/vomiting Fever (100.4 degrees or more) Incision problems (pus or other fluid coming out, redness, warmth, increased pain)  Things to Expect After Surgery Mild to Moderate pain is normal during the first day or two after surgery. If prescribed, take Ibuprofen or Tylenol first and use the stronger medicine for break-through pain. You can overlap these medicines because they work differently.   Constipation   To Prevent Constipation:  Eat a well-balanced diet including protein, grains, fresh fruit and vegetables.  Drink plenty of fluids. Walk regularly.  Depending on specific instructions from your physician: take Miralax daily and additionally you can add a stool  softener (colace/ docusate) and fiber supplement. Continue as long as you're on pain medications.   To Treat Constipation:  If you do not have a bowel movement in 2 days after surgery, you can take 2 Tbs of Milk of Magnesia 1-2 times a day until you have a bowel movement. If diarrhea occurs, decrease the amount or stop the laxative. If no results with Milk of Magnesia, you can drink a bottle of magnesium citrate which you can purchase over the counter.  Fatigue:  This is a normal response to surgery and will improve with time.  Plan frequent rest periods throughout the day.  Gas Pain:  This is very common but can also be very painful! Drink warm liquids such as herbal teas, bouillon or soup. Walking will help you pass more gas.  Mylicon or Gas-X can be taken over the counter.  Incisions: If you have incisions on your abdomen, the skin glue will dissolve on its own over time. It is ok to gently rinse with soap and water over these incisions but do not scrub.  Return to Work  As work demands and recovery times vary widely, it is hard to predict when you will want to return to work. If you have a desk job with no strenuous physical activity, and if you would like to return sooner than generally recommended, discuss this with your provider or call our office.   Post op concerns  For non-emergent issues, please call the Urogynecology Nurse. Please leave a message and someone will contact you within one business day.  You can also send a message through MyChart.   AFTER HOURS (After 5:00 PM and on weekends):  For urgent matters that cannot wait until the next business day. Call our office [phone] and connect to the doctor on call.  Please reserve this for important issues.   **FOR ANY TRUE EMERGENCY ISSUES CALL 911 OR GO TO THE NEAREST EMERGENCY ROOM.** Please inform our office or the doctor on call of any emergency.     APPOINTMENTS: Call [phone]  No Tylenol/acetaminophen products  until 4:15pm or after No ibuprofen/Advil/Motrin products until 7:45pm or after   Post Anesthesia Home Care Instructions  Activity: Get plenty of rest for the remainder of the day. A responsible individual must stay with you for 24 hours following the procedure.  For the next 24 hours, DO NOT: -Drive a car -Advertising copywriter -Drink alcoholic beverages -Take any medication unless instructed by your physician -Make any legal decisions or sign important papers.  Meals: Start with liquid foods such as gelatin or soup. Progress to regular foods as tolerated. Avoid greasy, spicy, heavy foods. If nausea and/or vomiting occur, drink only clear liquids until the nausea and/or vomiting subsides. Call your physician if vomiting continues.  Special Instructions/Symptoms: Your throat may feel dry or sore from the anesthesia or the breathing tube placed in your throat during surgery. If this causes discomfort, gargle with warm salt water. The discomfort should disappear within 24 hours.  If you had a scopolamine patch placed behind your ear for the management of post- operative nausea and/or vomiting:  1. The medication in the patch is effective for 72 hours, after which it should be removed.  Wrap patch in a tissue and discard in the trash. Wash hands thoroughly with soap and water. 2. You may remove the patch earlier than 72 hours if you experience unpleasant side effects which may include dry mouth, dizziness or visual disturbances. 3. Avoid touching the patch. Wash your hands with soap and water after contact with the patch.

## 2022-02-13 NOTE — Interval H&P Note (Signed)
History and Physical Interval Note:  02/13/2022 11:16 AM  Alyssa [name]  has presented today for surgery, with the diagnosis of vaginal vault prolapse; anterior prolapse, posterior prolapse.  The various methods of treatment have been discussed with the patient and family. After consideration of risks, benefits and other options for treatment, the patient has consented to  Procedure(s): XI ROBOTIC ASSISTED LAPAROSCOPIC SACROCOLPOPEXY (N/A) CYSTOSCOPY (N/A) as a surgical intervention.  The patient's history has been reviewed, patient examined, no change in status, stable for surgery.  I have reviewed the patient's chart and labs.  Questions were answered to the patient's satisfaction.     Marguerita Beards

## 2022-02-13 NOTE — Anesthesia Preprocedure Evaluation (Addendum)
Anesthesia Evaluation  Patient identified by MRN, date of birth, ID bandGeneral Assessment Comment:Pt drowsy from her morning meds  Reviewed: Allergy & Precautions, NPO status , Patient's Chart, lab work & pertinent test results  History of Anesthesia Complications Negative for: history of anesthetic complications  Airway Mallampati: I  TM Distance: >3 FB Neck ROM: Full    Dental  (+) Edentulous Upper, Loose, Missing, Dental Advisory Given, Poor Dentition   Pulmonary COPD, Current SmokerPatient did not abstain from smoking.,    breath sounds clear to auscultation       Cardiovascular negative cardio ROS   Rhythm:Regular Rate:Normal     Neuro/Psych  Headaches,    GI/Hepatic negative GI ROS, Neg liver ROS,   Endo/Other  negative endocrine ROS  Renal/GU negative Renal ROS     Musculoskeletal  (+) Arthritis ,   Abdominal   Peds  Hematology negative hematology ROS (+)   Anesthesia Other Findings   Reproductive/Obstetrics                            Anesthesia Physical Anesthesia Plan  ASA: 3  Anesthesia Plan: General   Post-op Pain Management: Toradol IV (intra-op)*, Tylenol PO (pre-op)* and Gabapentin PO (pre-op)*   Induction: Intravenous  PONV Risk Score and Plan: 2 and Ondansetron and Dexamethasone  Airway Management Planned: Oral ETT  Additional Equipment: None  Intra-op Plan:   Post-operative Plan: Extubation in OR  Informed Consent: I have reviewed the patients History and Physical, chart, labs and discussed the procedure including the risks, benefits and alternatives for the proposed anesthesia with the patient or authorized representative who has indicated his/her understanding and acceptance.     Dental advisory given  Plan Discussed with: CRNA and Surgeon  Anesthesia Plan Comments: (Pt had lung cancer screening CT in early Feb, she requested to know those results:  discussed that all is OK for her anesthetic and surgery today, yet there are at least 2 nodules in her L lung that will need follow up CT in 3 months.  She understands to discuss the CT with Dr. Jacky Kindle in further detail. )      Anesthesia Quick Evaluation

## 2022-02-13 NOTE — Op Note (Signed)
Operative Note  Preoperative Diagnosis: anterior vaginal prolapse, posterior vaginal prolapse, and vaginal vault prolapse after hysterectomy  Postoperative Diagnosis: anterior vaginal prolapse, posterior vaginal prolapse, and vaginal vault prolapse after hysterectomy and pelvic adhesions  Procedures performed:  Robotic assisted laparoscopic lysis of adhesions  Implants: none  Attending Surgeon: Lanetta Inch, MD  Assistant Surgeon: Annamaria Boots, MD   Anesthesia: General endotracheal  Findings: Dense adhesions of omentum to anterior abdominal wall up to the umbilicus. Dense small bowel adhesions to the left pelvic sidewall, attached to the vaginal cuff and over the sacrum.   Specimens: None  Estimated blood loss: 100 mL  IV fluids: 1700 mL  Urine output: 500 mL  Complications: Unable to complete planned procedure of robotic sacrocolpopexy due to extensive bowel adhesions in the pelvis.   Procedure in Detail:  After informed consent was obtained, the patient was taken to the operating room, where general anesthesia was induced and found to be adequate. She was placed in dorsolithotomy position in yellowfin stirrups. Her hips were noted not to be hyperflexed or hyperextended. Her arms were padded with gel pads and tucked to her sides. Her hands were surrounded by foam. A padded strap was placed across her chest with foam between the pad and her skin. She was noted to be appropriately positioned with all pressure points well padded and off tension. A tilt test showed no slippage. She was prepped and draped in the usual sterile fashion.  A sterile Foley catheter was inserted.   0.25% plain Marcaine was injected into the supraumbilical area and an incision was made with a scalpel. A Veress needle was inserted into the incision, CO2 insufflation was started, a low opening pressure was noted, and pneumoperitoneum was obtained. The Veress needle was removed and a 28mm robotic  trocar was placed. Entry into the peritoneal cavity was confirmed. Dense omental adhesions were noted in the abdominal midline up to the level of the umbilicus. A second robotic trocar was similarly placed approximately 4cm superior to the midline to avoid the adhesions. Adhesions were taken down with laparoscopic scissors until further ports could be placed. After determining placement for the other ports, Local anesthetic was injected at each site and two 8 mm incisions were made for robotic ports at 10 cm lateral to and at the level of the umbilical port. Two additional 8 mm incisions were made 10 cm lateral to these and 30 degrees down followed by 8 mm robotic ports - the right side for an assistant port. All trocars were placed sequentially under direct visualization of the camera. The patient was placed in Trendelenburg.  Monopolar endoshears were placed in the right arm, a [place] bipolar grasper was placed in the 2nd arm of the patient's left side, and a Tip up grasper was placed in the 3rd arm on the patient's left side.   On inspection of the pelvis, there were adhesions of bowel to the left and right pelvic sidewalls and to the vaginal cuff. Sharp dissection and cauterization where needed was performed to excise the adhesions. A lucite probe was placed in the vagina to place tension on the vaginal cuff to aid in dissection. In total, over one hour of lysis of adhesions were performed but little progress was made. Decision was made to abandon the sacrocolpopexy. Arista was placed over the pelvis and omentum. Good hemostasis was noted.      The Foley catheter was removed.  The robot was undocked. The CO2 gas was removed and the  ports were removed.  The skin incisions were closed with subcutaneous stitches of 4-0 Monocryl and covered with skin glue. The patient tolerated the procedure well. Sponge, lap, and needle counts were correct x 2. She was awakened from anesthesia and transferred to the recovery  room in stable condition.   Marguerita Beards, MD

## 2022-02-13 NOTE — Telephone Encounter (Signed)
Alyssa [name] underwent Robotic assisted laparoscopic lysis of adhesions on 02/13/22.   Alyssa [name] did not require a voiding trial. FYI- her surgery was not able to be completed as planned due to adhesions. Will plan to call patient to discuss further tomorrow as Alyssa [name] was too tired after procedure (spoke with family).   Alyssa [name] was discharged without a catheter. Please call her for a routine post op check. Thanks!  Marguerita Beards, MD

## 2022-02-13 NOTE — Anesthesia Procedure Notes (Signed)
Procedure Name: Intubation Date/Time: 02/13/2022 11:58 AM Performed by: Justice Rocher, CRNA Pre-anesthesia Checklist: Patient identified, Emergency Drugs available, Suction available, Patient being monitored and Timeout performed Patient Re-evaluated:Patient Re-evaluated prior to induction Oxygen Delivery Method: Circle system utilized Preoxygenation: Pre-oxygenation with 100% oxygen Induction Type: IV induction Ventilation: Mask ventilation without difficulty Laryngoscope Size: Mac and 3 Grade View: Grade II Tube type: Oral Tube size: 7.0 mm Number of attempts: 1 Airway Equipment and Method: Stylet and Oral airway Placement Confirmation: ETT inserted through vocal cords under direct vision, positive ETCO2, breath sounds checked- equal and bilateral and CO2 detector Secured at: 22 cm Tube secured with: Tape Dental Injury: Teeth and Oropharynx as per pre-operative assessment

## 2022-02-13 NOTE — Transfer of Care (Signed)
Immediate Anesthesia Transfer of Care Note  Patient: Alyssa [name]  Procedure(s) Performed: Procedure(s) (LRB): XI ROBOTIC ASSISTED LAPAROSCOPIC LYSIS OF ADHESIONS (N/A)  Patient Location: PACU  Anesthesia Type: General  Level of Consciousness: awake, sedated, patient cooperative and responds to stimulation  Airway & Oxygen Therapy: Patient Spontanous Breathing and Patient connected to [place] 02 and soft FM   Post-op Assessment: Report given to PACU RN, Post -op Vital signs reviewed and stable and Patient moving all extremities  Post vital signs: Reviewed and stable  Complications: No apparent anesthesia complications

## 2022-02-14 NOTE — Anesthesia Postprocedure Evaluation (Signed)
Anesthesia Post Note  Patient: Alyssa [name]  Procedure(s) Performed: XI ROBOTIC ASSISTED LAPAROSCOPIC LYSIS OF ADHESIONS (Abdomen)     Patient location during evaluation: PACU Anesthesia Type: General Level of consciousness: awake and alert, patient cooperative and oriented (more awake than was preop) Pain management: pain level controlled Vital Signs Assessment: post-procedure vital signs reviewed and stable Respiratory status: spontaneous breathing, nonlabored ventilation and respiratory function stable Cardiovascular status: blood pressure returned to baseline and stable Postop Assessment: no apparent nausea or vomiting and able to ambulate Anesthetic complications: no Comments: Delayed note entry, pt eval in PACU    No notable events documented.  Last Vitals:  Vitals:   02/13/22 1515 02/13/22 1555  BP: 118/75 122/63  Pulse: 68 66  Resp: 19 18  Temp: (!) 36.4 C (!) 36.4 C  SpO2: 95% 98%    Last Pain:  Vitals:   02/13/22 1555  TempSrc:   PainSc: 4                  Ahmir Bracken,E. Logen Fowle

## 2022-02-15 ENCOUNTER — Encounter (HOSPITAL_BASED_OUTPATIENT_CLINIC_OR_DEPARTMENT_OTHER): Payer: Self-pay | Admitting: *Deleted

## 2022-02-15 ENCOUNTER — Encounter (HOSPITAL_BASED_OUTPATIENT_CLINIC_OR_DEPARTMENT_OTHER): Payer: Self-pay | Admitting: Obstetrics and Gynecology

## 2022-02-15 MED ORDER — HYDROMORPHONE HCL 2 MG PO TABS: 2.0000 mg | ORAL_TABLET | ORAL | 0 refills | Status: DC | PRN

## 2022-02-15 NOTE — Telephone Encounter (Signed)
Post- Op Call  Alyssa [name] underwent robotic assisted laparoscopic lysis of adhesions on 02/13/22 with Dr Wannetta Sender. The patient reports that her pain is not controlled. She is taking tylenol and ibuprofen rotating and she has been taking Dilaudid every 4 hours. She states that the pain is in the abdominal area. She has had a bowel movement. She was discharged without a catheter. Advised that I would send her pain concern to provider and get back to her with recommendations.  Blenda Nicely, RN

## 2022-02-15 NOTE — Telephone Encounter (Signed)
LMOVM that additional prescription has been sent to pharmacy. Advised that pt can take 4mg  every 4-6 hours for pain. Advised to call if pain still not controlled

## 2022-02-15 NOTE — Telephone Encounter (Signed)
Will send additional 2mg  tabs of dilaudid to the pharmacy. She can take 4mg  (2 pills) every 4-6 hours.

## 2022-02-20 ENCOUNTER — Ambulatory Visit: Payer: Medicare Other | Admitting: Emergency Medicine

## 2022-02-20 ENCOUNTER — Telehealth: Payer: Self-pay | Admitting: Internal Medicine

## 2022-02-20 ENCOUNTER — Encounter: Payer: Self-pay | Admitting: Emergency Medicine

## 2022-02-20 ENCOUNTER — Other Ambulatory Visit: Payer: Self-pay

## 2022-02-20 DIAGNOSIS — R918 Other nonspecific abnormal finding of lung field: Secondary | ICD-10-CM

## 2022-02-20 DIAGNOSIS — Z720 Tobacco use: Secondary | ICD-10-CM | POA: Insufficient documentation

## 2022-02-20 DIAGNOSIS — J449 Chronic obstructive pulmonary disease, unspecified: Secondary | ICD-10-CM | POA: Insufficient documentation

## 2022-02-20 NOTE — Telephone Encounter (Signed)
Pt with prolapsed vagina and was referred to Dr. Lanetta Inch for surgery by Dr. Janann August. Surgery was aborted due to so much scar tissue. Note in epic. Pt states that she was told her rectum is "stuck" to her vagina. Per pt Dr. Jacky Kindle suggested she contact Dr. Marina Goodell to see if he could do anything regarding this issue. Discussed with pt that message would be sent to Dr. Marina Goodell but this sounds like a surgical issue and perhaps she needs to have Dr. Janann August refer her to [facility] or [facility]. Pt wanted to see what Dr. Marina Goodell recommends. Please advise.

## 2022-02-20 NOTE — Progress Notes (Signed)
Subjective:    Patient ID: Alyssa [name], female    DOB: 11-01-1956, 66 y.o.   MRN: [id number]  HPI 66 year old active smoker (60 pack years) with a history of osteoarthritis, vaginal prolapse, anxiety/depression, hyperlipidemia, followed by Dr. Jacky Kindle at [facility].  She is here to talk about an abnormal CT scan of the chest. She was treated empirically with cefdinir based on the GGI seen on CT. She has daily cough, may be a bit better since the abx. She has some exertional SOB, is not on any BD.   Lung cancer screening CT chest performed on 02/06/2022 and reviewed by me, shows centrilobular and paraseptal emphysema, bilateral upper lobe groundglass opacities.  There are numerous irregular solid left lower lobe pulmonary nodules, largest 9 mm.  There is a 12 mm part solid left lower lobe nodule with a 7 mm solid component.  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   Dyspnea    sob with heavy activity   Headache    Pain in limb    Primary localized osteoarthrosis, lower leg    oa   Wears dentures top   Wears glasses      Family History  Problem Relation Age of Onset   Stroke Mother    Hypertension Father    Breast cancer Paternal Aunt      Social History   Socioeconomic History   Marital status: Married    Spouse name: Not on file   Number of children: Not on file   Years of education: Not on file   Highest education level: Not on file  Occupational History   Not on file  Tobacco Use   Smoking status: Every Day    Packs/day: 1.50    Years: 40.00    Pack years: 60.00    Types: Cigarettes   Smokeless tobacco: Never   Tobacco comments:    10 cigarettes smoked daily ARJ 02/20/22  Vaping Use   Vaping Use: Never used  Substance and Sexual Activity   Alcohol use: No   Drug use: No   Sexual activity: Not Currently  Other Topics Concern   Not on file   Social History Narrative   Not on file   Social Determinants of Health   Financial Resource Strain: Not on file  Food Insecurity: Not on file  Transportation Needs: Not on file  Physical Activity: Not on file  Stress: Not on file  Social Connections: Not on file  Intimate Partner Violence: Not on file    Has worked as a caregiver and housewife From [place] No military No TB exposure.   Allergies  Allergen Reactions   Bee Venom Anaphylaxis   Arthrotec [Diclofenac-Misoprostol]     Not sure   Codeine Itching    headache   Cymbalta [Duloxetine Hcl]     "felt I was sinking into the floor"   Effexor [Venlafaxine Hydrochloride]     confusion   Escitalopram Oxalate     confusion   Hydrocodone     Severe headache, itching   Neurontin [Gabapentin]     Not sure   Oxymorphone Hcl     "It didn't work"   Pentazocine Lactate     Not sure   Provigil [Modafinil]     Not sure   Sertraline Hcl     More depressed  Outpatient Medications Prior to Visit  Medication Sig Dispense Refill   acetaminophen (TYLENOL) 500 MG tablet Take 1 tablet (500 mg total) by mouth every 6 (six) hours as needed (pain). 30 tablet 0   ALPRAZolam (XANAX) 1 MG tablet Take 1 mg by mouth at bedtime as needed for anxiety.     ascorbic acid (VITAMIN C) 500 MG tablet Take 500 mg by mouth daily.     aspirin EC 81 MG tablet Take 81 mg by mouth daily. Swallow whole.     Cholecalciferol (D3 MAXIMUM STRENGTH) 125 MCG (5000 UT) capsule Take 5,000 Units by mouth daily.     HYDROmorphone (DILAUDID) 2 MG tablet Take 1 tablet (2 mg total) by mouth every 4 (four) hours as needed for severe pain. 15 tablet 0   ibuprofen (ADVIL) 600 MG tablet Take 1 tablet (600 mg total) by mouth every 6 (six) hours as needed. (Patient not taking: Reported on 02/13/2022) 30 tablet 0   polyethylene glycol powder (GLYCOLAX/MIRALAX) 17 GM/SCOOP powder Take 17 g by mouth daily. Drink 17g (1 scoop) dissolved in water per day. 255 g 0   pregabalin  (LYRICA) 100 MG capsule Take 100 mg by mouth 3 (three) times daily.     No facility-administered medications prior to visit.          Objective:   Physical Exam Vitals:   02/20/22 1357  BP: 124/70  Pulse: 78  Temp: 98.8 F (37.1 C)  TempSrc: Oral  SpO2: 97%  Weight: 165 lb 12.8 oz (75.2 kg)  Height: 5\' 5"  (1.651 m)    Gen: Pleasant, well-nourished, in no distress,  normal affect  ENT: No lesions,  mouth clear,  oropharynx clear, no postnasal drip  Neck: No JVD, no stridor  Lungs: No use of accessory muscles, no crackles or wheezing on normal respiration, no wheeze on forced expiration  Cardiovascular: RRR, heart sounds normal, no murmur or gallops, no peripheral edema  Musculoskeletal: No deformities, no cyanosis or clubbing  Neuro: alert, awake, non focal  Skin: Warm, no lesions or rash      Assessment & Plan:  Pulmonary nodules Found on lung cancer screening CT 02/06/2022.  There are left lower lobe solid and also a mixed density nodule.  10% risk of malignancy in the groundglass nodule based on the Hitchcock model.  Plan to repeat her CT chest, super D cuts in 3 months.  If suspicion increases then would favor diagnostics.  Discussed with her the Veracyte trial for nodule risk stratification.  She wanted to defer.  COPD (chronic obstructive pulmonary disease) (HCC) Minimal clinical symptoms but she is not very active.  Suspect that she does have COPD based on CT results, tobacco history.  We will plan pulmonary function testing at some point going forward.  She will probably benefit from BD at some point.  Tobacco use Discussed cessation with her today.  She is not ready to quit but she would be interested in trying to cut down.  We agreed that she will try to get down to 15 cigarettes daily by our next visit.   Sandwich, MD, PhD 02/20/2022, 2:31 PM [facility] [phone] or if no answer before 7:00PM call [phone] For any issues  after 7:00PM please call eLink [phone]

## 2022-02-20 NOTE — Assessment & Plan Note (Signed)
Discussed cessation with her today.  She is not ready to quit but she would be interested in trying to cut down.  We agreed that she will try to get down to 15 cigarettes daily by our next visit.

## 2022-02-20 NOTE — Patient Instructions (Signed)
We will plan to repeat your CT scan of the chest in 3 months to compare with prior. We discussed your cigarette smoking a day.  There is evidence on your CT scan that you have some early changes consistent with COPD or emphysema. We will perform pulmonary function testing at some point going forward We agreed today that she would cut her cigarettes down to 15 cigarettes daily by our next visit. Follow Dr. Delton Coombes in 3 months to review your CT scan results.

## 2022-02-20 NOTE — Assessment & Plan Note (Signed)
Minimal clinical symptoms but she is not very active.  Suspect that she does have COPD based on CT results, tobacco history.  We will plan pulmonary function testing at some point going forward.  She will probably benefit from BD at some point.

## 2022-02-20 NOTE — Telephone Encounter (Signed)
Inbound call from patient requesting to speak with a nurse in regards to prolapsed colon.  Please advise.

## 2022-02-20 NOTE — Addendum Note (Signed)
Addended by: Dierdre Highman on: 02/20/2022 02:39 PM   Modules accepted: Orders

## 2022-02-20 NOTE — Assessment & Plan Note (Signed)
Found on lung cancer screening CT 02/06/2022.  There are left lower lobe solid and also a mixed density nodule.  10% risk of malignancy in the groundglass nodule based on the La Valle model.  Plan to repeat her CT chest, super D cuts in 3 months.  If suspicion increases then would favor diagnostics.  Discussed with her the Veracyte trial for nodule risk stratification.  She wanted to defer.

## 2022-02-21 NOTE — Telephone Encounter (Signed)
Spoke with pts daughter and she is aware of Dr. Lamar Sprinkles recommendations. States they will try to get this surgical issue taken care of first and call back regarding colon screening.

## 2022-02-21 NOTE — Telephone Encounter (Signed)
1.  This is not a primary GI issue.  I defer to her gynecologic surgeons 2.  Her last colonoscopy was 2006.  If she has not had up-to-date colon cancer screening (any strategy) then she would be due.

## 2022-02-22 ENCOUNTER — Other Ambulatory Visit: Payer: Self-pay | Admitting: Internal Medicine

## 2022-02-22 DIAGNOSIS — Z1231 Encounter for screening mammogram for malignant neoplasm of breast: Secondary | ICD-10-CM

## 2022-02-23 ENCOUNTER — Encounter: Payer: Self-pay | Admitting: Obstetrics and Gynecology

## 2022-02-23 ENCOUNTER — Ambulatory Visit (INDEPENDENT_AMBULATORY_CARE_PROVIDER_SITE_OTHER): Payer: Medicare Other | Admitting: Obstetrics and Gynecology

## 2022-02-23 ENCOUNTER — Other Ambulatory Visit: Payer: Self-pay

## 2022-02-23 VITALS — BP 145/73 | HR 87

## 2022-02-23 DIAGNOSIS — N993 Prolapse of vaginal vault after hysterectomy: Secondary | ICD-10-CM

## 2022-02-23 DIAGNOSIS — N8110 Cystocele, unspecified: Secondary | ICD-10-CM

## 2022-02-23 DIAGNOSIS — G8929 Other chronic pain: Secondary | ICD-10-CM

## 2022-02-23 DIAGNOSIS — N816 Rectocele: Secondary | ICD-10-CM | POA: Diagnosis not present

## 2022-02-23 DIAGNOSIS — M545 Low back pain, unspecified: Secondary | ICD-10-CM

## 2022-02-23 NOTE — Progress Notes (Signed)
[facility]  Date of Visit: 02/23/2022  History of Present Illness: Ms. [name] is a 66 y.o. female scheduled today for a post-operative visit.   Surgery: s/p laparoscopic lysis of adhesions on 02/13/22- unable to perform planned sacrocolpopexy due to presence of dense bowel adhesions adhered to the vaginal cuff and over the sacrum  Today she reports she has been having increasing back pain, has been using her cane. She would like to try a pessary at this time.   UTI symptoms? No  Pain? No  Voiding dysfunction: No  Bowel issues: No    Medications: She has a current medication list which includes the following prescription(s): acetaminophen, alprazolam, ascorbic acid, aspirin ec, d3 maximum strength, hydromorphone, ibuprofen, polyethylene glycol powder, and pregabalin.   Allergies: Patient is allergic to bee venom, arthrotec [diclofenac-misoprostol], codeine, cymbalta [duloxetine hcl], effexor [venlafaxine hydrochloride], escitalopram oxalate, hydrocodone, neurontin [gabapentin], oxymorphone hcl, pentazocine lactate, provigil [modafinil], and sertraline hcl.   Physical Exam: BP (!) 145/73    Pulse 87   Abdomen: soft, non-tender, without masses or organomegaly Laparoscopic Incisions: healing well.   External genitalia: bruising noted on right vulva Pessary fitting: a #2 cube pessary was placed. It was comfortable, fit well, and stayed in placed with strong cough, valsalva and bending.   Prior POP-Q:    POP-Q   3                                            Aa   4.5                                           Ba   5                                              C    5                                            Gh   2.5                                            Pb   5.5                                            tvl    3                                            Ap   4.5  Bp                                                   D    ---------------------------------------------------------  Assessment and Plan:  1. Vaginal vault prolapse after hysterectomy   2. Prolapse of anterior vaginal wall   3. Prolapse of posterior vaginal wall   4. Chronic midline low back pain, unspecified whether sciatica present     - We reviewed options for proceeding with prolapse management including pessary, obtaining a second opinion regarding surgery, or surgical management with repeat native tissue repair or colpocleisis. She is not a candidate for vaginal mesh due to smoking.  - She opted to have a pessary (#2 cube) placed today.  - Has already failed sacrospinous fixation so she does not want to repeat this surgery. We reviewed that with a colpocleisis she would not be able to have penetrative intercourse due to vaginal closure. We discussed the option of the OhNut device for her partner to simulate intercourse and that she will also still be able to have oral sex or use clitoral stimulation. She desires to proceed with the colpocleisis. Handout provided about this procedure.  - physical therapy referral placed due to back pain and she will contact her spine surgeon for follow up.   All questions answered. Return for pre op  Alyssa Beards, MD   Time spent: I spent 45 minutes dedicated to the care of this patient on the date of this encounter to include pre-visit review of records, face-to-face time with the patient discussing treatment options and post visit documentation. Additional time was spent on the pessary fitting.

## 2022-02-27 ENCOUNTER — Other Ambulatory Visit: Payer: Self-pay | Admitting: Internal Medicine

## 2022-02-27 ENCOUNTER — Ambulatory Visit
Admission: RE | Admit: 2022-02-27 | Discharge: 2022-02-27 | Disposition: A | Discharge status: Home / Self Care | Payer: Medicare Other | Source: Ambulatory Visit | Attending: Internal Medicine | Admitting: Internal Medicine

## 2022-02-27 ENCOUNTER — Telehealth: Payer: Self-pay | Admitting: Obstetrics and Gynecology

## 2022-02-27 DIAGNOSIS — R590 Localized enlarged lymph nodes: Secondary | ICD-10-CM

## 2022-02-27 MED ORDER — IOPAMIDOL (ISOVUE-300) INJECTION 61%
75.0000 mL | Freq: Once | INTRAVENOUS | Status: AC | PRN
  Administered 2022-02-27: 75 mL via INTRAVENOUS

## 2022-02-28 ENCOUNTER — Telehealth: Payer: Self-pay

## 2022-02-28 NOTE — Telephone Encounter (Signed)
Alyssa [name] is a 66 y.o. female called into the department requesting to pick up her records from all attended office visits with Dr. Florian Buff. Pt was upset saying her primary care physician has been trying to get in touch with [facility] dept to request records.  ? ?[employer] called for records on 02/27/2022 requesting patient visit notes. We explained that we needed a records request form issues in order to fulfill the request. [facility] replied that they did not need to send a form due to updated hipaa guidelines but urogyn declined request until request form is obtained. [facility] is not the referring office for this patient's care. Please review. ? ?All requested office visit records will be printed and given to pt directly on hand. Pt states she will pick up on today. ?

## 2022-02-28 NOTE — Telephone Encounter (Signed)
Pt was contacted

## 2022-03-01 ENCOUNTER — Ambulatory Visit (INDEPENDENT_AMBULATORY_CARE_PROVIDER_SITE_OTHER): Payer: Medicare Other | Admitting: Obstetrics and Gynecology

## 2022-03-01 ENCOUNTER — Other Ambulatory Visit: Payer: Self-pay

## 2022-03-01 ENCOUNTER — Encounter: Payer: Self-pay | Admitting: Obstetrics and Gynecology

## 2022-03-01 VITALS — BP 134/86 | HR 91

## 2022-03-01 DIAGNOSIS — N816 Rectocele: Secondary | ICD-10-CM | POA: Diagnosis not present

## 2022-03-01 DIAGNOSIS — Z01818 Encounter for other preprocedural examination: Secondary | ICD-10-CM

## 2022-03-01 DIAGNOSIS — N993 Prolapse of vaginal vault after hysterectomy: Secondary | ICD-10-CM | POA: Diagnosis not present

## 2022-03-01 DIAGNOSIS — N8110 Cystocele, unspecified: Secondary | ICD-10-CM | POA: Diagnosis not present

## 2022-03-01 MED ORDER — ACETAMINOPHEN 500 MG PO TABS: 500.0000 mg | ORAL_TABLET | Freq: Four times a day (QID) | ORAL | 0 refills | Status: DC | PRN

## 2022-03-01 MED ORDER — IBUPROFEN 600 MG PO TABS: 600.0000 mg | ORAL_TABLET | Freq: Four times a day (QID) | ORAL | 0 refills | Status: DC | PRN

## 2022-03-01 MED ORDER — POLYETHYLENE GLYCOL 3350 17 GM/SCOOP PO POWD: 17.0000 g | Freq: Every day | ORAL | 0 refills | Status: AC

## 2022-03-01 MED ORDER — HYDROMORPHONE HCL 2 MG PO TABS: 2.0000 mg | ORAL_TABLET | ORAL | 0 refills | Status: DC | PRN

## 2022-03-01 NOTE — Progress Notes (Signed)
[facility] Pre-Operative visit  Subjective Chief Complaint: Alyssa [name] presents for a preoperative encounter.   History of Present Illness: Alyssa [name] is a 66 y.o. female who presents for preoperative visit.  She is scheduled to undergo Exam under anesthesia, Colpocleisis, cystoscopy, perineorrhaphy on 03/22/22.  Her symptoms include vaginal bulge and discomfort, and she was was found to have Stage IV anterior, Stage IV posterior, Stage IV apical prolapse  She had 2 prior prolapse repairs without leakage- did not undergo urodynamics.   Past Medical History:  Diagnosis Date   Bright's disease age 13   issue resolved   Dyspnea    sob with heavy activity   Headache    Pain in limb    Primary localized osteoarthrosis, lower leg    oa   Wears dentures top   Wears glasses      Past Surgical History:  Procedure Laterality Date   ABDOMINAL HYSTERECTOMY  yrs ago   1 ovary left   ANTERIOR AND POSTERIOR REPAIR N/A 05/30/2021   Procedure: ANTERIOR (CYSTOCELE) AND POSTERIOR REPAIR (RECTOCELE);  Surgeon: Cheri Fowler, MD;  Location: [facility];  Service: Gynecology;  Laterality: N/A;   BACK SURGERY  yrs ago   x3 bolts  and screws and rods x 2 lower back   HEMORRHOID SURGERY  yrs ago   ROBOTIC ASSISTED LAPAROSCOPIC SACROCOLPOPEXY N/A 02/13/2022   Procedure: XI ROBOTIC ASSISTED LAPAROSCOPIC LYSIS OF ADHESIONS;  Surgeon: Jaquita Folds, MD;  Location: [facility];  Service: Gynecology;  Laterality: N/A;   VAGINAL PROLAPSE REPAIR N/A 10/19/2021   Procedure: VAGINAL VAULT SUSPENSION;  Surgeon: Cheri Fowler, MD;  Location: [place];  Service: Gynecology;  Laterality: N/A;   VULVAR LESION REMOVAL N/A 05/30/2021   Procedure: REMOVAL OF VULVAR CYST;  Surgeon: Cheri Fowler, MD;  Location: [place];  Service: Gynecology;  Laterality: N/A;    is allergic to bee venom, arthrotec  [diclofenac-misoprostol], codeine, cymbalta [duloxetine hcl], effexor [venlafaxine hydrochloride], escitalopram oxalate, hydrocodone, neurontin [gabapentin], oxymorphone hcl, pentazocine lactate, provigil [modafinil], and sertraline hcl.   Family History  Problem Relation Age of Onset   Stroke Mother    Hypertension Father    Breast cancer Paternal Aunt     Social History   Tobacco Use   Smoking status: Every Day    Packs/day: 1.50    Years: 40.00    Pack years: 60.00    Types: Cigarettes   Smokeless tobacco: Never   Tobacco comments:    10 cigarettes smoked daily ARJ 02/20/22  Vaping Use   Vaping Use: Never used  Substance Use Topics   Alcohol use: No   Drug use: No     Review of Systems was negative for a full 10 system review except as noted in the History of Present Illness.   Current Outpatient Medications:    acetaminophen (TYLENOL) 500 MG tablet, Take 1 tablet (500 mg total) by mouth every 6 (six) hours as needed (pain)., Disp: 30 tablet, Rfl: 0   ALPRAZolam (XANAX) 1 MG tablet, Take 1 mg by mouth at bedtime as needed for anxiety., Disp: , Rfl:    ascorbic acid (VITAMIN C) 500 MG tablet, Take 500 mg by mouth daily., Disp: , Rfl:    aspirin EC 81 MG tablet, Take 81 mg by mouth daily. Swallow whole., Disp: , Rfl:    Cholecalciferol (D3 MAXIMUM STRENGTH) 125 MCG (5000 UT) capsule, Take 5,000 Units by mouth daily., Disp: ,  Rfl:    HYDROmorphone (DILAUDID) 2 MG tablet, Take 1 tablet (2 mg total) by mouth every 4 (four) hours as needed for severe pain., Disp: 15 tablet, Rfl: 0   ibuprofen (ADVIL) 600 MG tablet, Take 1 tablet (600 mg total) by mouth every 6 (six) hours as needed. (Patient not taking: Reported on 02/13/2022), Disp: 30 tablet, Rfl: 0   polyethylene glycol powder (GLYCOLAX/MIRALAX) 17 GM/SCOOP powder, Take 17 g by mouth daily. Drink 17g (1 scoop) dissolved in water per day., Disp: 255 g, Rfl: 0   pregabalin (LYRICA) 100 MG capsule, Take 100 mg by mouth 3 (three)  times daily., Disp: , Rfl:    Objective Vitals:   03/01/22 1322  BP: 134/86  Pulse: 91    Gen: NAD CV: S1 S2 RRR Lungs: Clear to auscultation bilaterally Abd: soft, nontender  Pessary fitting: A 2-1/2 in gellhorn pessary was placed. It was comfortable, fit well, and stayed in placed with strong cough, valsalva and bending. One finger was able to fit between the pessary and the vaginal walls.   Previous Pelvic Exam showed: POP-Q:    POP-Q   3                                            Aa   4.5                                           Ba   5                                              C    5                                            Gh   2.5                                            Pb   5.5                                            tvl    3                                            Ap   4.5                                            Bp  D         Assessment/ Plan  Assessment: The patient is a 66 y.o. year old scheduled to undergo Exam under anesthesia, Colpocleisis, cystoscopy, perineorrhaphy. Verbal consent was obtained for these procedures.  Plan: General Surgical Consent: The patient has previously been counseled on alternative treatments, and the decision by the patient and provider was to proceed with the procedure listed above.  For all procedures, there are risks of bleeding, infection, damage to surrounding organs including but not limited to bowel, bladder, blood vessels, ureters and nerves, and need for further surgery if an injury were to occur. These risks are all low with minimally invasive surgery.   There are risks of numbness and weakness at any body site or buttock/rectal pain.  It is possible that baseline pain can be worsened by surgery, either with or without mesh. If surgery is vaginal, there is also a low risk of possible conversion to laparoscopy or open abdominal incision where  indicated. Very rare risks include blood transfusion, blood clot, heart attack, pneumonia, or death.   There is also a risk of short-term postoperative urinary retention with need to use a catheter. About half of patients need to go home from surgery with a catheter, which is then later removed in the office. The risk of long-term need for a catheter is very low. There is also a risk of worsening of overactive bladder.    Prolapse (with or without mesh): Risk factors for surgical failure  include things that put pressure on your pelvis and the surgical repair, including obesity, chronic cough, and heavy lifting or straining (including lifting children or adults, straining on the toilet, or lifting heavy objects such as furniture or anything weighing >25 lbs. Risks of recurrence is 20-30% with vaginal native tissue repair and a less than 10% with sacrocolpopexy with mesh.    We discussed consent for blood products. Risks for blood transfusion include allergic reactions, other reactions that can affect different body organs and managed accordingly, transmission of infectious diseases such as HIV or Hepatitis. However, the blood is screened. Patient consents for blood products.  Pre-operative instructions:  She was instructed to not take Aspirin/NSAIDs x 7days prior to surgery. Antibiotic prophylaxis was ordered as indicated.  Cathter use: Patient will go home with foley if needed after post-operative voiding trial.  Post-operative instructions:  She was provided with specific post-operative instructions, including precautions and signs/symptoms for which we would recommend contacting us, in addition to daytime and after-hours contact phone numbers. This was provided on a handout.   Post-operative medications: Prescriptions for motrin, tylenol, miralax, and dilaudid were sent to her pharmacy. Discussed using ibuprofen and tylenol on a schedule to limit use of narcotics. These are not to be taken until  after her surgery.   Laboratory testing:  no labs required  Preoperative clearance:  She does not require surgical clearance.    Post-operative follow-up:  A post-operative appointment will be made for 6 weeks from the date of surgery. If she needs a post-operative nurse visit for a voiding trial, that will be set up after she leaves the hospital.    Patient will call the clinic or use MyChart should anything change or any new issues arise.   Jaquita Folds, MD

## 2022-03-02 ENCOUNTER — Telehealth: Payer: Self-pay | Admitting: Emergency Medicine

## 2022-03-02 MED ORDER — FLUTICASONE PROPIONATE 50 MCG/ACT NA SUSP: 2.0000 | Freq: Every day | NASAL | 2 refills | Status: DC

## 2022-03-02 NOTE — Telephone Encounter (Signed)
Called and spoke with pt. Pt stated that she believes when she coughed up the blood yesterday 3/2, that blood was coming from postnasal drainage that she had been having. ? ?Pt said that her allergies have begun to bother her. Asked if she had been using any nasal sprays/allergy meds and pt said that she had not. She stated that it was recommended by someone to get zyrtec to try which I agreed with pt. Also stated to pt that she could try flonase nasal spray to see if that would also give her some relief and she verbalized understanding. Pt requested to have rx sent to pharmacy for the flonase so this has been sent for pt. Nothing further needed. ?

## 2022-03-05 ENCOUNTER — Encounter: Payer: Medicare Other | Admitting: Obstetrics and Gynecology

## 2022-03-05 ENCOUNTER — Ambulatory Visit
Admission: RE | Admit: 2022-03-05 | Discharge: 2022-03-05 | Disposition: A | Discharge status: Home / Self Care | Payer: Medicare Other | Source: Ambulatory Visit | Attending: Internal Medicine | Admitting: Internal Medicine

## 2022-03-05 ENCOUNTER — Other Ambulatory Visit: Payer: Self-pay

## 2022-03-05 DIAGNOSIS — Z1231 Encounter for screening mammogram for malignant neoplasm of breast: Secondary | ICD-10-CM

## 2022-03-08 ENCOUNTER — Emergency Department (HOSPITAL_COMMUNITY): Payer: Medicare Other

## 2022-03-08 ENCOUNTER — Observation Stay (HOSPITAL_COMMUNITY)
Admission: EM | Admit: 2022-03-08 | Discharge: 2022-03-09 | Disposition: A | Discharge status: Home / Self Care | Payer: Medicare Other | Attending: Cardiovascular Disease | Admitting: Cardiovascular Disease

## 2022-03-08 ENCOUNTER — Other Ambulatory Visit: Payer: Self-pay

## 2022-03-08 ENCOUNTER — Encounter (HOSPITAL_COMMUNITY)
Admission: EM | Disposition: A | Discharge status: Home / Self Care | Payer: Self-pay | Source: Home / Self Care | Attending: Emergency Medicine

## 2022-03-08 DIAGNOSIS — Z823 Family history of stroke: Secondary | ICD-10-CM | POA: Insufficient documentation

## 2022-03-08 DIAGNOSIS — G894 Chronic pain syndrome: Secondary | ICD-10-CM | POA: Diagnosis not present

## 2022-03-08 DIAGNOSIS — M625A0 Muscle wasting and atrophy, not elsewhere classified, back, cervical: Secondary | ICD-10-CM | POA: Diagnosis not present

## 2022-03-08 DIAGNOSIS — Z209 Contact with and (suspected) exposure to unspecified communicable disease: Secondary | ICD-10-CM | POA: Insufficient documentation

## 2022-03-08 DIAGNOSIS — J069 Acute upper respiratory infection, unspecified: Secondary | ICD-10-CM | POA: Insufficient documentation

## 2022-03-08 DIAGNOSIS — J449 Chronic obstructive pulmonary disease, unspecified: Secondary | ICD-10-CM | POA: Diagnosis not present

## 2022-03-08 DIAGNOSIS — R911 Solitary pulmonary nodule: Secondary | ICD-10-CM | POA: Diagnosis not present

## 2022-03-08 DIAGNOSIS — R1013 Epigastric pain: Secondary | ICD-10-CM | POA: Insufficient documentation

## 2022-03-08 DIAGNOSIS — Z720 Tobacco use: Secondary | ICD-10-CM | POA: Diagnosis present

## 2022-03-08 DIAGNOSIS — I2119 ST elevation (STEMI) myocardial infarction involving other coronary artery of inferior wall: Secondary | ICD-10-CM | POA: Insufficient documentation

## 2022-03-08 DIAGNOSIS — Z79899 Other long term (current) drug therapy: Secondary | ICD-10-CM | POA: Diagnosis not present

## 2022-03-08 DIAGNOSIS — R0981 Nasal congestion: Secondary | ICD-10-CM | POA: Insufficient documentation

## 2022-03-08 DIAGNOSIS — I2510 Atherosclerotic heart disease of native coronary artery without angina pectoris: Secondary | ICD-10-CM | POA: Insufficient documentation

## 2022-03-08 DIAGNOSIS — R918 Other nonspecific abnormal finding of lung field: Secondary | ICD-10-CM | POA: Diagnosis not present

## 2022-03-08 DIAGNOSIS — Z20822 Contact with and (suspected) exposure to covid-19: Secondary | ICD-10-CM | POA: Insufficient documentation

## 2022-03-08 DIAGNOSIS — R9431 Abnormal electrocardiogram [ECG] [EKG]: Secondary | ICD-10-CM | POA: Diagnosis not present

## 2022-03-08 DIAGNOSIS — Z8249 Family history of ischemic heart disease and other diseases of the circulatory system: Secondary | ICD-10-CM | POA: Insufficient documentation

## 2022-03-08 DIAGNOSIS — F17210 Nicotine dependence, cigarettes, uncomplicated: Secondary | ICD-10-CM | POA: Diagnosis not present

## 2022-03-08 DIAGNOSIS — H9203 Otalgia, bilateral: Secondary | ICD-10-CM | POA: Insufficient documentation

## 2022-03-08 DIAGNOSIS — R079 Chest pain, unspecified: Secondary | ICD-10-CM | POA: Diagnosis present

## 2022-03-08 DIAGNOSIS — R042 Hemoptysis: Secondary | ICD-10-CM | POA: Diagnosis not present

## 2022-03-08 DIAGNOSIS — R070 Pain in throat: Secondary | ICD-10-CM | POA: Diagnosis present

## 2022-03-08 DIAGNOSIS — R590 Localized enlarged lymph nodes: Secondary | ICD-10-CM | POA: Insufficient documentation

## 2022-03-08 DIAGNOSIS — F1210 Cannabis abuse, uncomplicated: Secondary | ICD-10-CM | POA: Insufficient documentation

## 2022-03-08 DIAGNOSIS — R0982 Postnasal drip: Secondary | ICD-10-CM | POA: Insufficient documentation

## 2022-03-08 DIAGNOSIS — M47812 Spondylosis without myelopathy or radiculopathy, cervical region: Secondary | ICD-10-CM | POA: Diagnosis not present

## 2022-03-08 DIAGNOSIS — I213 ST elevation (STEMI) myocardial infarction of unspecified site: Secondary | ICD-10-CM

## 2022-03-08 DIAGNOSIS — I700 Atherosclerosis of aorta: Secondary | ICD-10-CM | POA: Insufficient documentation

## 2022-03-08 DIAGNOSIS — M542 Cervicalgia: Secondary | ICD-10-CM | POA: Insufficient documentation

## 2022-03-08 DIAGNOSIS — Z7982 Long term (current) use of aspirin: Secondary | ICD-10-CM | POA: Insufficient documentation

## 2022-03-08 HISTORY — DX: Tobacco use: Z72.0

## 2022-03-08 HISTORY — DX: Cannabis abuse, uncomplicated: F12.10

## 2022-03-08 HISTORY — PX: LEFT HEART CATH AND CORONARY ANGIOGRAPHY: CATH118249

## 2022-03-08 HISTORY — DX: Other chronic pain: G89.29

## 2022-03-08 LAB — CBC WITH DIFFERENTIAL/PLATELET
Abs Immature Granulocytes: 0.03 10*3/uL (ref 0.00–0.07)
Basophils Absolute: 0 10*3/uL (ref 0.0–0.1)
Basophils Relative: 0 %
Eosinophils Absolute: 0 10*3/uL (ref 0.0–0.5)
Eosinophils Relative: 0 %
HCT: 40.7 % (ref 36.0–46.0)
Hemoglobin: 13.4 g/dL (ref 12.0–15.0)
Immature Granulocytes: 0 %
Lymphocytes Relative: 14 %
Lymphs Abs: 1.2 10*3/uL (ref 0.7–4.0)
MCH: 29.8 pg (ref 26.0–34.0)
MCHC: 32.9 g/dL (ref 30.0–36.0)
MCV: 90.6 fL (ref 80.0–100.0)
Monocytes Absolute: 1.1 10*3/uL — ABNORMAL HIGH (ref 0.1–1.0)
Monocytes Relative: 12 %
Neutro Abs: 6.4 10*3/uL (ref 1.7–7.7)
Neutrophils Relative %: 74 %
Platelets: 259 10*3/uL (ref 150–400)
RBC: 4.49 MIL/uL (ref 3.87–5.11)
RDW: 12.6 % (ref 11.5–15.5)
WBC: 8.7 10*3/uL (ref 4.0–10.5)
nRBC: 0 % (ref 0.0–0.2)

## 2022-03-08 LAB — BASIC METABOLIC PANEL
Anion gap: 10 (ref 5–15)
BUN: 14 mg/dL (ref 8–23)
CO2: 26 mmol/L (ref 22–32)
Calcium: 8.9 mg/dL (ref 8.9–10.3)
Chloride: 100 mmol/L (ref 98–111)
Creatinine, Ser: 0.68 mg/dL (ref 0.44–1.00)
GFR, Estimated: >60 mL/min (ref >60)
Glucose, Bld: 126 mg/dL — ABNORMAL HIGH (ref 70–99)
Potassium: 4 mmol/L (ref 3.5–5.1)
Sodium: 136 mmol/L (ref 135–145)

## 2022-03-08 LAB — TROPONIN I (HIGH SENSITIVITY): Troponin I (High Sensitivity): 4 ng/L (ref <18)

## 2022-03-08 LAB — RESP PANEL BY RT-PCR (FLU A&B, COVID) ARPGX2
Influenza A by PCR: NEGATIVE
Influenza B by PCR: NEGATIVE
SARS Coronavirus 2 by RT PCR: NEGATIVE

## 2022-03-08 LAB — GROUP A STREP BY PCR: Group A Strep by PCR: NOT DETECTED

## 2022-03-08 SURGERY — LEFT HEART CATH AND CORONARY ANGIOGRAPHY
Anesthesia: LOCAL

## 2022-03-08 MED ORDER — LIDOCAINE VISCOUS HCL 2 % MT SOLN
15.0000 mL | Freq: Once | OROMUCOSAL | Status: AC
Start: 2022-03-08 — End: 2022-03-08
  Administered 2022-03-08: 23:00:00 15 mL via ORAL
  Filled 2022-03-08: qty 15

## 2022-03-08 MED ORDER — MIDAZOLAM HCL 2 MG/2ML IJ SOLN
INTRAMUSCULAR | Status: DC | PRN
  Administered 2022-03-08: 1 mg via INTRAVENOUS

## 2022-03-08 MED ORDER — HEPARIN (PORCINE) IN NACL 1000-0.9 UT/500ML-% IV SOLN
INTRAVENOUS | Status: DC | PRN
  Administered 2022-03-08 (×2): 500 mL

## 2022-03-08 MED ORDER — LIDOCAINE HCL (PF) 1 % IJ SOLN
INTRAMUSCULAR | Status: AC
  Filled 2022-03-08: qty 30

## 2022-03-08 MED ORDER — ASPIRIN 81 MG PO CHEW
324.0000 mg | CHEWABLE_TABLET | Freq: Once | ORAL | Status: AC
  Administered 2022-03-08: 23:00:00 324 mg via ORAL

## 2022-03-08 MED ORDER — FENTANYL CITRATE (PF) 100 MCG/2ML IJ SOLN
INTRAMUSCULAR | Status: AC
  Filled 2022-03-08: qty 2

## 2022-03-08 MED ORDER — ACETAMINOPHEN 650 MG RE SUPP
650.0000 mg | Freq: Once | RECTAL | Status: DC
  Filled 2022-03-08: qty 1

## 2022-03-08 MED ORDER — LIDOCAINE HCL (PF) 1 % IJ SOLN
INTRAMUSCULAR | Status: DC | PRN
  Administered 2022-03-08: 2 mL

## 2022-03-08 MED ORDER — HEPARIN (PORCINE) IN NACL 1000-0.9 UT/500ML-% IV SOLN
INTRAVENOUS | Status: AC
  Filled 2022-03-08: qty 1000

## 2022-03-08 MED ORDER — MIDAZOLAM HCL 2 MG/2ML IJ SOLN
INTRAMUSCULAR | Status: AC
  Filled 2022-03-08: qty 2

## 2022-03-08 MED ORDER — FENTANYL CITRATE (PF) 100 MCG/2ML IJ SOLN
INTRAMUSCULAR | Status: DC | PRN
  Administered 2022-03-08: 25 ug via INTRAVENOUS

## 2022-03-08 MED ORDER — VERAPAMIL HCL 2.5 MG/ML IV SOLN
INTRAVENOUS | Status: AC
  Filled 2022-03-08: qty 2

## 2022-03-08 MED ORDER — ALUM & MAG HYDROXIDE-SIMETH 200-200-20 MG/5ML PO SUSP
30.0000 mL | Freq: Once | ORAL | Status: AC
Start: 2022-03-08 — End: 2022-03-08
  Administered 2022-03-08: 23:00:00 30 mL via ORAL
  Filled 2022-03-08: qty 30

## 2022-03-08 MED ORDER — VERAPAMIL HCL 2.5 MG/ML IV SOLN
INTRAVENOUS | Status: DC | PRN
  Administered 2022-03-08: 10 mL via INTRA_ARTERIAL

## 2022-03-08 MED ORDER — ACETAMINOPHEN 325 MG PO TABS
650.0000 mg | ORAL_TABLET | Freq: Once | ORAL | Status: AC
  Administered 2022-03-08: 21:00:00 650 mg via ORAL

## 2022-03-08 MED ORDER — HEPARIN SODIUM (PORCINE) 1000 UNIT/ML IJ SOLN
4000.0000 [IU] | Freq: Once | INTRAMUSCULAR | Status: AC
  Administered 2022-03-08: 4000 [IU] via INTRAVENOUS

## 2022-03-08 MED ORDER — IOHEXOL 350 MG/ML SOLN
INTRAVENOUS | Status: DC | PRN
  Administered 2022-03-08: 80 mL

## 2022-03-08 MED ORDER — HEPARIN SODIUM (PORCINE) 1000 UNIT/ML IJ SOLN
INTRAMUSCULAR | Status: AC
  Filled 2022-03-08: qty 10

## 2022-03-08 SURGICAL SUPPLY — 12 items
CATH INFINITI 5 FR JL3.5 (CATHETERS) ×1 IMPLANT
CATH INFINITI 5FR ANG PIGTAIL (CATHETERS) ×1 IMPLANT
CATH INFINITI JR4 5F (CATHETERS) ×1 IMPLANT
DEVICE RAD COMP TR BAND LRG (VASCULAR PRODUCTS) ×1 IMPLANT
GLIDESHEATH SLEND SS 6F .021 (SHEATH) ×1 IMPLANT
GUIDEWIRE INQWIRE 1.5J.035X260 (WIRE) IMPLANT
INQWIRE 1.5J .035X260CM (WIRE) ×2
KIT HEART LEFT (KITS) ×2 IMPLANT
PACK CARDIAC CATHETERIZATION (CUSTOM PROCEDURE TRAY) ×2 IMPLANT
SYR MEDRAD MARK 7 150ML (SYRINGE) ×2 IMPLANT
TRANSDUCER W/STOPCOCK (MISCELLANEOUS) ×2 IMPLANT
TUBING CIL FLEX 10 FLL-RA (TUBING) ×2 IMPLANT

## 2022-03-08 NOTE — ED Provider Notes (Signed)
? ?[facility] EMERGENCY DEPARTMENT  ?Provider Note ? ?CSN: [id number] ?Arrival date & time: 03/08/22 1653 ? ?History ?Chief Complaint  ?Patient presents with  ? Hemoptysis  ? Sore Throat  ? Abdominal Pain  ? ? ?Alyssa [name] is a 66 y.o. female who presents today with sore throat and abdominal pain.  Patient said that she woke up this morning at 3 AM with sore throat, congestion, headache.  She also had pain rating to her bilateral ears.  She had some pain in her chest.  She describes it as a burning sensation.  No shortness of breath, denies any chest pressure.  No palpitations.  No fevers. ? ?Regards to the burning pain, she states that it sometimes worsens when she leans forward.  She now describes it more as a discomfort, with some epigastric discomfort. ? ? ?Home Medications ?Prior to Admission medications   ?Medication Sig Start Date End Date Taking? Authorizing Provider  ?acetaminophen (TYLENOL) 500 MG tablet Take 1 tablet (500 mg total) by mouth every 6 (six) hours as needed (pain). 03/01/22   Marguerita Beards, MD  ?ALPRAZolam Prudy Feeler) 1 MG tablet Take 1 mg by mouth at bedtime as needed for anxiety.    [provider]  ?ascorbic acid (VITAMIN C) 500 MG tablet Take 500 mg by mouth daily.    [provider]  ?aspirin EC 81 MG tablet Take 81 mg by mouth daily. Swallow whole.    [provider]  ?Cholecalciferol (D3 MAXIMUM STRENGTH) 125 MCG (5000 UT) capsule Take 5,000 Units by mouth daily.    [provider]  ?fluticasone (FLONASE) 50 MCG/ACT nasal spray Place 2 sprays into both nostrils daily. 03/02/22   Leslye Peer, MD  ?HYDROmorphone (DILAUDID) 2 MG tablet Take 1 tablet (2 mg total) by mouth every 4 (four) hours as needed for severe pain. 03/01/22   Marguerita Beards, MD  ?ibuprofen (ADVIL) 600 MG tablet Take 1 tablet (600 mg total) by mouth every 6 (six) hours as needed. 03/01/22   Marguerita Beards, MD  ?polyethylene glycol powder  (GLYCOLAX/MIRALAX) 17 GM/SCOOP powder Take 17 g by mouth daily. Drink 17g (1 scoop) dissolved in water per day. 03/01/22   Marguerita Beards, MD  ?pregabalin (LYRICA) 100 MG capsule Take 100 mg by mouth 3 (three) times daily.    [provider]  ?traMADol (ULTRAM) 50 MG tablet Take by mouth every 6 (six) hours as needed.    [provider]  ? ? ? ?Allergies    ?Bee venom, Arthrotec [diclofenac-misoprostol], Codeine, Cymbalta [duloxetine hcl], Effexor [venlafaxine hydrochloride], Escitalopram oxalate, Hydrocodone, Neurontin [gabapentin], Oxymorphone hcl, Pentazocine lactate, Provigil [modafinil], and Sertraline hcl ? ? ?Review of Systems   ?Review of Systems  ?Constitutional:  Negative for chills and fever.  ?HENT:  Positive for sore throat. Negative for ear pain.   ?Eyes:  Negative for pain and visual disturbance.  ?Respiratory:  Negative for cough and shortness of breath.   ?Cardiovascular:  Negative for chest pain and palpitations.  ?Gastrointestinal:  Positive for abdominal pain. Negative for vomiting.  ?Genitourinary:  Negative for dysuria and hematuria.  ?Musculoskeletal:  Negative for arthralgias and back pain.  ?Skin:  Negative for color change and rash.  ?Neurological:  Negative for seizures and syncope.  ?All other systems reviewed and are negative. ?Please see HPI for pertinent positives and negatives ? ?Physical Exam ?BP 102/76   Pulse 78   Temp 98.3 ?F (36.8 ?C) (Oral)   Resp  20   SpO2 96%  ? ?Physical Exam ?Vitals and nursing note reviewed.  ?Constitutional:   ?   General: She is not in acute distress. ?   Appearance: She is well-developed.  ?HENT:  ?   Head: Normocephalic and atraumatic.  ?Eyes:  ?   Conjunctiva/sclera: Conjunctivae normal.  ?Cardiovascular:  ?   Rate and Rhythm: Normal rate and regular rhythm.  ?   Heart sounds: No murmur heard. ?Pulmonary:  ?   Effort: Pulmonary effort is normal. No respiratory distress.  ?   Breath sounds: Normal breath sounds.  ?Abdominal:   ?   Palpations: Abdomen is soft.  ?   Tenderness: There is no abdominal tenderness.  ?Musculoskeletal:     ?   General: No swelling.  ?   Cervical back: Neck supple.  ?Skin: ?   General: Skin is warm and dry.  ?   Capillary Refill: Capillary refill takes less than 2 seconds.  ?Neurological:  ?   Mental Status: She is alert.  ?Psychiatric:     ?   Mood and Affect: Mood normal.  ? ? ?ED Results / Procedures / Treatments   ?EKG ?None ? ?Procedures ?Procedures ? ?Medications Ordered in the ED ?Medications  ?acetaminophen (TYLENOL) tablet 650 mg (650 mg Oral Given 03/08/22 2044)  ?alum & mag hydroxide-simeth (MAALOX/MYLANTA) 200-200-20 MG/5ML suspension 30 mL (30 mLs Oral Given 03/08/22 2241)  ?  And  ?lidocaine (XYLOCAINE) 2 % viscous mouth solution 15 mL (15 mLs Oral Given 03/08/22 2240)  ? ? ? ?ED Course  ? ?  ? ? ?MDM  ? ?This patient presents to the ED for concern of throat burning, this involves an extensive number of treatment options, and is a complaint that carries with it a high risk of complications and morbidity.  The differential diagnosis includes viral illness, PE, ACS, pancreatitis. Patient?s presentation is complicated by their history of smoking, recent surgery ? ? ?Additional history obtained: ?Additional history obtained from family ?Records reviewed previous admission documents and Primary Care Documents ? ?Lab Tests: ?I Ordered, and personally interpreted labs.  The pertinent results include: CBC and BMP were reassuring.  Strep was negative.  COVID flu was negative. ? ?Imaging Studies ordered: ?I ordered imaging studies including X-ray chest   ?I independently visualized and interpreted imaging which showed no acute abnormalities ?I agree with the radiologist interpretation ? ?EKG (personally reviewed and interpreted): Concern for possible segment elevation in 2 3 and aVF. ? ? ?Medical Decision Making: Patient presented with complaints of sore throat, congestion, cough.  She also had some epigastric and  throat burning.  She had some burning in her chest earlier today.  This is since resolved.  Her labs are reassuring.  EKG was obtained after patient sat in triage for 6 hours.  There was concern for possible ST segment elevations in 2 3 and aVF.  Cardiology was consulted with concern for STEMI.  They plan to take patient to the Cath Lab. ? ?Complexity of problems addressed: ?Patient?s presentation is most consistent with  acute presentation with potential threat to life or bodily function ? ?Disposition: ?After consideration of the diagnostic results and the patient?s response to treatment,  ?I feel that the patent would benefit from admission for catheterization .  ? ?Patient seen in conjunction with my attending, Dr. Dalene Seltzer. ? ? ? ?Final Clinical Impression(s) / ED Diagnoses ?Final diagnoses:  ?Upper respiratory tract infection, unspecified type  ?Electrocardiography suggestive of ST elevation myocardial infarction (STEMI) (HCC)  ? ? ?  Rx / DC Orders ?ED Discharge Orders   ? ? None  ? ?  ? ? ?  ?Edison SimonBestler, Chiquitta Matty, MD ?03/08/22 2258 ? ?  ?Alvira MondaySchlossman, Erin, MD ?03/09/22 1127 ? ?

## 2022-03-08 NOTE — H&P (Signed)
Cardiology Admission History and Physical:   Patient ID: Alyssa [name] MRN: [id number]; DOB: 19-Sep-1956   Admission date: 03/08/2022  Primary Care Provider: Geoffry Paradise, MD [facility] Cardiologist: None  [facility] Electrophysiologist:  None   Chief Complaint: throat pain   Patient Profile:   Alyssa [name] is a 66 y.o. female with tobacco and marijuana use who presents with throat pain and ECG c/w inferolateral STEMI.   History of Present Illness:   Alyssa [name] reports that around 3 AM this morning (03/09) she woke up with acute onset bilateral throat pain that was 7/10 duration and nonradiating.  She denied any chest pain or chest pressure but had ongoing throat pain throughout the day that varied in intensity but worsened around 4 PM.  She called her physician's office and nurse on call directed her to the emergency department for further evaluation.  During my evaluation she was still having 6/10 severity throat pain but denied any shortness of breath, dyspnea on exertion, chest discomfort, back pain, orthopnea or PND.  She has a long history of tobacco use smoking on average 1 pack/day for the last 40 years which she has recently cut back to around half pack a day over the past few weeks.  She denies any prior coronary history or cardiac evaluation in the past.  She has never had prior echo or stress testing.  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;  Service: Gynecology;  Laterality:  N/A;   BACK SURGERY  yrs ago   x3 bolts  and screws and rods x 2 lower back   HEMORRHOID SURGERY  yrs ago   ROBOTIC ASSISTED LAPAROSCOPIC SACROCOLPOPEXY N/A 02/13/2022   Procedure: XI ROBOTIC ASSISTED LAPAROSCOPIC LYSIS OF ADHESIONS;  Surgeon: Marguerita Beards, MD;  Location: [facility];  Service: Gynecology;  Laterality: N/A;   VAGINAL PROLAPSE REPAIR N/A 10/19/2021   Procedure: VAGINAL VAULT SUSPENSION;  Surgeon: Lavina Hamman, MD;  Location: [facility];  Service: Gynecology;  Laterality: N/A;   VULVAR LESION REMOVAL N/A 05/30/2021   Procedure: REMOVAL OF VULVAR CYST;  Surgeon: Lavina Hamman, MD;  Location: [facility];  Service: Gynecology;  Laterality: N/A;    Medications Prior to Admission: Prior to Admission medications   Medication Sig Start Date End Date Taking? Authorizing Provider  aspirin EC 81 MG tablet Take 81 mg by mouth daily. Swallow whole.   Yes [provider]  ibuprofen (ADVIL) 600 MG tablet Take 1 tablet (600 mg total) by mouth every 6 (six) hours as needed. 03/01/22  Yes Marguerita Beards, MD  acetaminophen (TYLENOL) 500 MG tablet Take 1 tablet (500 mg total) by mouth every 6 (six) hours as needed (pain). 03/01/22   Marguerita Beards, MD  ALPRAZolam Prudy Feeler) 1 MG tablet Take 1 mg by mouth at bedtime as needed for anxiety.    [provider]  ascorbic acid (VITAMIN  C) 500 MG tablet Take 500 mg by mouth daily.    [provider]  Cholecalciferol (D3 MAXIMUM STRENGTH) 125 MCG (5000 UT) capsule Take 5,000 Units by mouth daily.    [provider]  fluticasone (FLONASE) 50 MCG/ACT nasal spray Place 2 sprays into both nostrils daily. 03/02/22   Leslye PeerByrum, Robert S, MD  HYDROmorphone (DILAUDID) 2 MG tablet Take 1 tablet (2 mg total) by mouth every 4 (four) hours as needed for severe pain. 03/01/22   Marguerita BeardsSchroeder, Michelle N, MD  polyethylene glycol powder (GLYCOLAX/MIRALAX) 17 GM/SCOOP powder Take 17  g by mouth daily. Drink 17g (1 scoop) dissolved in water per day. 03/01/22   Marguerita BeardsSchroeder, Michelle N, MD  pregabalin (LYRICA) 100 MG capsule Take 100 mg by mouth 3 (three) times daily.    [provider]  traMADol (ULTRAM) 50 MG tablet Take by mouth every 6 (six) hours as needed.    [provider]    Allergies:    Allergies  Allergen Reactions   Bee Venom Anaphylaxis   Arthrotec [Diclofenac-Misoprostol]     Not sure   Codeine Itching    headache   Cymbalta [Duloxetine Hcl]     "felt I was sinking into the floor"   Effexor [Venlafaxine Hydrochloride]     confusion   Escitalopram Oxalate     confusion   Hydrocodone     Severe headache, itching   Neurontin [Gabapentin]     Not sure   Oxymorphone Hcl     "It didn't work"   Pentazocine Lactate     Not sure   Provigil [Modafinil]     Not sure   Sertraline Hcl     More depressed   Social History:   Social History   Socioeconomic History   Marital status: Married    Spouse name: Not on file   Number of children: Not on file   Years of education: Not on file   Highest education level: Not on file  Occupational History   Not on file  Tobacco Use   Smoking status: Every Day    Packs/day: 1.50    Years: 40.00    Pack years: 60.00    Types: Cigarettes   Smokeless tobacco: Never   Tobacco comments:    10 cigarettes smoked daily ARJ 02/20/22  Vaping Use   Vaping Use: Never used  Substance and Sexual Activity   Alcohol use: No   Drug use: No   Sexual activity: Not Currently  Other Topics Concern   Not on file  Social History Narrative   Not on file   Social Determinants of Health   Financial Resource Strain: Not on file  Food Insecurity: Not on file  Transportation Needs: Not on file  Physical Activity: Not on file  Stress: Not on file  Social Connections: Not on file  Intimate Partner Violence: Not on file    Family History:   The patient's family history includes Breast cancer in her paternal  aunt; Hypertension in her father; Stroke in her mother.    ROS:   Review of Systems: [y] = yes, [ ]  = no      General: Weight gain [ ] ; Weight loss [ ] ; Anorexia [ ] ; Fatigue [ ] ; Fever [ ] ; Chills [ ] ; Weakness [ ]    Cardiac: Chest pain/pressure [ ] ; Resting SOB [ ] ; Exertional SOB [ ] ; Orthopnea [ ] ; Pedal Edema [ ] ; Palpitations [ ] ; Syncope [ ] ; Presyncope [ ] ; Paroxysmal nocturnal  dyspnea [ ]    Pulmonary: Cough [ ] ; Wheezing [ ] ; Hemoptysis [ ] ; Sputum [ ] ; Snoring [ ]    GI: Vomiting [ ] ; Dysphagia [ ] ; Melena [ ] ; Hematochezia [ ] ; Heartburn [ ] ; Abdominal pain [ ] ; Constipation [ ] ; Diarrhea [ ] ; BRBPR [ ]    GU: Hematuria [ ] ; Dysuria [ ] ; Nocturia [ ]  Vascular: Pain in legs with walking [ ] ; Pain in feet with lying flat [ ] ; Non-healing sores [ ] ; Stroke [ ] ; TIA [ ] ; Slurred speech [ ] ;   Neuro: Headaches [ ] ; Vertigo [ ] ; Seizures [ ] ; Paresthesias [ ] ;Blurred vision [ ] ; Diplopia [ ] ; Vision changes [ ]    Ortho/Skin: Arthritis [ ] ; Joint pain [ ] ; Muscle pain [ ] ; Joint swelling [ ] ; Back Pain [ ] ; Rash [ ]    Psych: Depression [ ] ; Anxiety [ ]    Heme: Bleeding problems [ ] ; Clotting disorders [ ] ; Anemia [ ]    Endocrine: Diabetes [ ] ; Thyroid dysfunction [ ]    Physical Exam/Data:   Vitals:   03/08/22 2323 03/08/22 2344 03/08/22 2356 03/09/22 0100  BP:    120/67  Pulse: 77   63  Resp: 14   (!) 24  Temp:    98.3 F (36.8 C)  TempSrc:    Oral  SpO2: 97% 100%  94%  Weight:   75.3 kg 74.5 kg  Height:   5\' 5"  (1.651 m)     Intake/Output Summary (Last 24 hours) at 03/09/2022 0147 Last data filed at 03/09/2022 0100 Gross per 24 hour  Intake 50 ml  Output --  Net 50 ml   Last 3 Weights 03/09/2022 03/08/2022 02/20/2022  Weight (lbs) 164 lb 3.9 oz 166 lb 165 lb 12.8 oz  Weight (kg) 74.5 kg 75.297 kg 75.206 kg     Body mass index is 27.33 kg/m.  General:  Well nourished, well developed, in no acute distress HEENT: normal Lymph: no adenopathy Neck: no JVD Endocrine:  No  thryomegaly Vascular: No carotid bruits; FA pulses 2+ bilaterally without bruits  Cardiac:  normal S1, S2; RRR; no murmur  Lungs:  clear to auscultation bilaterally, no wheezing, rhonchi or rales  Abd: soft, nontender, no hepatomegaly  Ext: no edema Musculoskeletal:  No deformities, BUE and BLE strength normal and equal Skin: warm and dry  Neuro:  CNs 2-12 intact, no focal abnormalities noted Psych:  Normal affect   EKG:  The ECG that was done 03/08/22 (22:39:18) was personally reviewed and demonstrates NSR, ventricular rate 76 bpm, PR 144 ms, QRS 89 ms, Qtc 436 ms, ST elevation in inferolateral leads with depression in aVR  Relevant CV Studies: None   Laboratory Data:  High Sensitivity Troponin:   Recent Labs  Lab 03/08/22 2245  TROPONINIHS 4      Chemistry Recent Labs  Lab 03/08/22 1810  NA 136  K 4.0  CL 100  CO2 26  GLUCOSE 126*  BUN 14  CREATININE 0.68  CALCIUM 8.9  GFRNONAA >60  ANIONGAP 10    No results for input(s): PROT, ALBUMIN, AST, ALT, ALKPHOS, BILITOT in the last 168 hours. Hematology Recent Labs  Lab 03/08/22 1810 03/09/22 0058  WBC 8.7 6.8  RBC 4.49 4.17  HGB 13.4 12.2  HCT 40.7 37.5  MCV 90.6 89.9  MCH 29.8 29.3  MCHC 32.9 32.5  RDW 12.6 12.6  PLT 259 225   BNPNo results for input(s): BNP, PROBNP in the last 168 hours.  DDimer  No results for input(s): DDIMER in the last 168 hours.  Radiology/Studies:  DG Chest 2 View  Result Date: 03/08/2022 CLINICAL DATA:  Hemoptysis EXAM: CHEST - 2 VIEW COMPARISON:  Chest CT 02/06/2022 FINDINGS: The heart size and mediastinal contours are within normal limits. Both lungs are clear. The visualized skeletal structures are unremarkable. IMPRESSION: No active cardiopulmonary disease. Electronically Signed   By: Jasmine Pang M.D.   On: 03/08/2022 18:47   CARDIAC CATHETERIZATION  Result Date: 03/09/2022   1st Diag lesion is 30% stenosed.   Prox LAD to Mid LAD lesion is 20% stenosed.   The left ventricular  systolic function is normal.   LV end diastolic pressure is normal.   The left ventricular ejection fraction is greater than 65% by visual estimate.   There is no mitral valve regurgitation. Mild non-obstructive disease in the Diagonal/mid LAD Moderate caliber Circumflex with no obstructive disease Large dominant RCA with no obstructive disease Hyperdynamic LV systolic function No evidence of dilated aortic root or ascending aorta dissection Recommendations: Will monitor overnight. Consider other causes of neck pain, chest burning and abnormal EKG.   { TIMI Risk Score for ST  Elevation MI:   The patient's TIMI risk score is 6, which indicates a 16.1% risk of all cause mortality at 30 days.{  Assessment and Plan:   Inferolateral STEMI  Throat pain  Patient presented with presumed atypical anginal equivalent of throat pain throughout the day which was sudden onset and fairly persistent.  Prominent ST elevations with reciprocal depressions resulted in STEMI activation and subsequent coronary angiography which did not reveal any focal stenosis and LV gram which did not reveal any wall motion abnormality or aortic dissection.  Her throat pain seems more consistent with sore throat related to possible viral infection with associated headache.  COVID-negative on admission associated fevers.  She had recent CT imaging of both her neck and chest.  She initially had a CT chest for lung cancer screening (02/07/22) which revealed numerous irregular solid pulmonary nodules that were suspicious for which she has follow-up in 3 months with repeat imaging.  Her dentist identified some adenopathy and recommended that she have additional imaging for which she had CT neck with contrast (02/07/22) which she does not regularly pathology her neck.  At this point we have ruled out obstructive coronary disease and so I think that her throat discomfort is unrelated.  It is improving now.  The only other consideration is whether her  ST elevations could have been related to pericarditis although I think this is unlikely given lack of any chest discomfort.  With a normal troponin I do not think that she has any myocardial involvement.  Echo ordered to ensure no effusion or wall motion abnormality. 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 Inpatient status is judged to be reasonable and necessary in order to provide the required intensity of service to ensure the patient's safety. The patient's presenting symptoms, physical exam findings, and initial radiographic and laboratory data in the context of their chronic comorbidities is felt to place them at high risk for further clinical deterioration. Furthermore, it is not  anticipated that the patient will be medically stable for discharge from the hospital within 2 midnights of admission.   * I certify that at the point of admission it is my clinical judgment that the patient will require inpatient hospital care spanning beyond 2 midnights from the point of admission due to high intensity of service, high risk for further deterioration and high frequency of surveillance required.*   For questions or updates, please contact [facility] Please consult [email or web address] for contact info under   Signed, Linton Rump, MD  03/09/2022 1:47 AM

## 2022-03-08 NOTE — ED Notes (Signed)
MD at bedside during EKG.  ?

## 2022-03-08 NOTE — ED Notes (Signed)
Cards at bedside

## 2022-03-08 NOTE — ED Triage Notes (Signed)
Pt here from home for sore throat and burning sensation in epigastric area that started at 0300 today. Per pt's husband she had an episode 2 days ago where she coughed up bright red blood w/ a clot. Pt reports she's been taking benadryl and flonase since her pulmonologist told her she probably had a sinus infection. ?

## 2022-03-08 NOTE — ED Provider Triage Note (Signed)
Emergency Medicine Provider Triage Evaluation Note ? ?[name] , a 66 y.o. female  was evaluated in triage.  Pt complains of multiple complaints.  Patient states that she woke up around 3 AM this morning with sore throat, congestion, headache.  She states the pain also radiates to bilateral ears and down her throat into her chest.  She feels like she is having burning sensation in her chest.  She also states that she has coughed up blood twice.  She states that she called her pulmonologist who stated that she should use some Flonase for possible allergies.  She denies shortness of breath, chest pain, palpitations, fevers.. ? ?Review of Systems  ?Positive: See above ?Negative:  ? ?Physical Exam  ?BP 108/77 (BP Location: Left Arm)   Pulse 89   Temp 98.3 ?F (36.8 ?C) (Oral)   Resp 18   SpO2 96%  ?Gen:   Awake, no distress   ?Resp:  Normal effort  ?MSK:   Moves extremities without difficulty  ?Other:  Oropharynx with erythema with mild swelling of the left posterior pharynx.  No tonsillar exudates. ? ?Medical Decision Making  ?Medically screening exam initiated at 6:00 PM.  Appropriate orders placed.  [name] was informed that the remainder of the evaluation will be completed by another provider, this initial triage assessment does not replace that evaluation, and the importance of remaining in the ED until their evaluation is complete. ? ? ?  ?Cristopher Peru, PA-C ?03/08/22 1801 ? ?

## 2022-03-08 NOTE — ED Notes (Signed)
MD at bedside. 

## 2022-03-09 ENCOUNTER — Encounter (HOSPITAL_COMMUNITY): Payer: Self-pay | Admitting: Cardiovascular Disease

## 2022-03-09 ENCOUNTER — Inpatient Hospital Stay (HOSPITAL_BASED_OUTPATIENT_CLINIC_OR_DEPARTMENT_OTHER): Payer: Medicare Other

## 2022-03-09 DIAGNOSIS — R918 Other nonspecific abnormal finding of lung field: Secondary | ICD-10-CM

## 2022-03-09 DIAGNOSIS — R042 Hemoptysis: Secondary | ICD-10-CM | POA: Diagnosis not present

## 2022-03-09 DIAGNOSIS — R0789 Other chest pain: Secondary | ICD-10-CM

## 2022-03-09 DIAGNOSIS — R079 Chest pain, unspecified: Secondary | ICD-10-CM

## 2022-03-09 DIAGNOSIS — R070 Pain in throat: Secondary | ICD-10-CM

## 2022-03-09 DIAGNOSIS — Z720 Tobacco use: Secondary | ICD-10-CM

## 2022-03-09 DIAGNOSIS — R9431 Abnormal electrocardiogram [ECG] [EKG]: Secondary | ICD-10-CM

## 2022-03-09 DIAGNOSIS — Z20822 Contact with and (suspected) exposure to covid-19: Secondary | ICD-10-CM | POA: Diagnosis not present

## 2022-03-09 DIAGNOSIS — I2119 ST elevation (STEMI) myocardial infarction involving other coronary artery of inferior wall: Secondary | ICD-10-CM | POA: Diagnosis not present

## 2022-03-09 DIAGNOSIS — F17210 Nicotine dependence, cigarettes, uncomplicated: Secondary | ICD-10-CM | POA: Diagnosis not present

## 2022-03-09 LAB — CBC
HCT: 37.5 % (ref 36.0–46.0)
Hemoglobin: 12.2 g/dL (ref 12.0–15.0)
MCH: 29.3 pg (ref 26.0–34.0)
MCHC: 32.5 g/dL (ref 30.0–36.0)
MCV: 89.9 fL (ref 80.0–100.0)
Platelets: 225 10*3/uL (ref 150–400)
RBC: 4.17 MIL/uL (ref 3.87–5.11)
RDW: 12.6 % (ref 11.5–15.5)
WBC: 6.8 10*3/uL (ref 4.0–10.5)
nRBC: 0 % (ref 0.0–0.2)

## 2022-03-09 LAB — HEMOGLOBIN A1C
Hgb A1c MFr Bld: 5.3 % (ref 4.8–5.6)
Mean Plasma Glucose: 105.41 mg/dL

## 2022-03-09 LAB — RESPIRATORY PANEL BY PCR

## 2022-03-09 LAB — BASIC METABOLIC PANEL
Anion gap: 10 (ref 5–15)
BUN: 14 mg/dL (ref 8–23)
CO2: 25 mmol/L (ref 22–32)
Calcium: 8.5 mg/dL — ABNORMAL LOW (ref 8.9–10.3)
Chloride: 98 mmol/L (ref 98–111)
Creatinine, Ser: 0.6 mg/dL (ref 0.44–1.00)
GFR, Estimated: >60 mL/min (ref >60)
Glucose, Bld: 108 mg/dL — ABNORMAL HIGH (ref 70–99)
Potassium: 3.7 mmol/L (ref 3.5–5.1)
Sodium: 133 mmol/L — ABNORMAL LOW (ref 135–145)

## 2022-03-09 LAB — MRSA NEXT GEN BY PCR, NASAL: MRSA by PCR Next Gen: NOT DETECTED

## 2022-03-09 LAB — LIPID PANEL
Cholesterol: 166 mg/dL (ref 0–200)
HDL: 58 mg/dL (ref >40)
LDL Cholesterol: 97 mg/dL (ref 0–99)
Total CHOL/HDL Ratio: 2.9 RATIO
Triglycerides: 57 mg/dL (ref <150)
VLDL: 11 mg/dL (ref 0–40)

## 2022-03-09 LAB — HIV ANTIBODY (ROUTINE TESTING W REFLEX): HIV Screen 4th Generation wRfx: NONREACTIVE

## 2022-03-09 LAB — ECHOCARDIOGRAM COMPLETE
Area-P 1/2: 4.15 cm2
Height: 65 in
S' Lateral: 2.6 cm
Weight: 2627.88 oz

## 2022-03-09 LAB — TSH: TSH: 1.148 u[IU]/mL (ref 0.350–4.500)

## 2022-03-09 LAB — TROPONIN I (HIGH SENSITIVITY): Troponin I (High Sensitivity): 4 ng/L (ref <18)

## 2022-03-09 MED ORDER — PREGABALIN 50 MG PO CAPS
100.0000 mg | ORAL_CAPSULE | Freq: Three times a day (TID) | ORAL | Status: DC
  Administered 2022-03-09 (×2): 100 mg via ORAL
  Filled 2022-03-09 (×2): qty 2

## 2022-03-09 MED ORDER — ATORVASTATIN CALCIUM 10 MG PO TABS
20.0000 mg | ORAL_TABLET | Freq: Every day | ORAL | Status: DC
  Administered 2022-03-09: 20 mg via ORAL
  Filled 2022-03-09: qty 2

## 2022-03-09 MED ORDER — ONDANSETRON HCL 4 MG/2ML IJ SOLN: 4.0000 mg | Freq: Four times a day (QID) | INTRAMUSCULAR | Status: DC | PRN

## 2022-03-09 MED ORDER — ALPRAZOLAM 0.5 MG PO TABS: 1.0000 mg | ORAL_TABLET | Freq: Every evening | ORAL | Status: DC | PRN

## 2022-03-09 MED ORDER — PHENOL 1.4 % MT LIQD
1.0000 | OROMUCOSAL | Status: DC | PRN
Start: 2022-03-09 — End: 2022-03-10
  Administered 2022-03-09: 1 via OROMUCOSAL
  Filled 2022-03-09: qty 177

## 2022-03-09 MED ORDER — ACETAMINOPHEN 325 MG PO TABS
650.0000 mg | ORAL_TABLET | ORAL | Status: DC | PRN
  Administered 2022-03-09: 650 mg via ORAL
  Filled 2022-03-09: qty 2

## 2022-03-09 MED ORDER — SODIUM CHLORIDE 0.9 % IV SOLN: 250.0000 mL | INTRAVENOUS | Status: DC | PRN

## 2022-03-09 MED ORDER — ALPRAZOLAM 0.5 MG PO TABS
1.0000 mg | ORAL_TABLET | Freq: Two times a day (BID) | ORAL | Status: DC | PRN
  Administered 2022-03-09: 1 mg via ORAL
  Filled 2022-03-09: qty 2

## 2022-03-09 MED ORDER — ASPIRIN 81 MG PO CHEW: 81.0000 mg | CHEWABLE_TABLET | Freq: Every day | ORAL | Status: DC

## 2022-03-09 MED ORDER — LABETALOL HCL 5 MG/ML IV SOLN: 10.0000 mg | INTRAVENOUS | Status: AC | PRN

## 2022-03-09 MED ORDER — NITROGLYCERIN 0.4 MG SL SUBL: 0.4000 mg | SUBLINGUAL_TABLET | SUBLINGUAL | Status: DC | PRN

## 2022-03-09 MED ORDER — SODIUM CHLORIDE 0.9% FLUSH: 3.0000 mL | INTRAVENOUS | Status: DC | PRN

## 2022-03-09 MED ORDER — SODIUM CHLORIDE 0.9% FLUSH
3.0000 mL | Freq: Two times a day (BID) | INTRAVENOUS | Status: DC
  Administered 2022-03-09: 3 mL via INTRAVENOUS

## 2022-03-09 MED ORDER — SODIUM CHLORIDE 0.9 % IV SOLN: INTRAVENOUS | Status: AC

## 2022-03-09 MED ORDER — TRAMADOL HCL 50 MG PO TABS
50.0000 mg | ORAL_TABLET | Freq: Two times a day (BID) | ORAL | Status: DC | PRN
  Administered 2022-03-09: 50 mg via ORAL
  Filled 2022-03-09: qty 1

## 2022-03-09 MED ORDER — NICOTINE 14 MG/24HR TD PT24
14.0000 mg | MEDICATED_PATCH | Freq: Every day | TRANSDERMAL | Status: DC
  Administered 2022-03-09: 14 mg via TRANSDERMAL
  Filled 2022-03-09: qty 1

## 2022-03-09 MED ORDER — ASPIRIN EC 81 MG PO TBEC
81.0000 mg | DELAYED_RELEASE_TABLET | Freq: Every day | ORAL | Status: DC
  Administered 2022-03-09: 81 mg via ORAL
  Filled 2022-03-09: qty 1

## 2022-03-09 MED ORDER — ACETAMINOPHEN 325 MG PO TABS: 650.0000 mg | ORAL_TABLET | ORAL | Status: DC | PRN

## 2022-03-09 MED ORDER — ATORVASTATIN CALCIUM 20 MG PO TABS: 20.0000 mg | ORAL_TABLET | Freq: Every day | ORAL | 2 refills | Status: DC

## 2022-03-09 MED ORDER — CHLORHEXIDINE GLUCONATE CLOTH 2 % EX PADS: 6.0000 | MEDICATED_PAD | Freq: Every day | CUTANEOUS | Status: DC

## 2022-03-09 MED ORDER — HYDRALAZINE HCL 20 MG/ML IJ SOLN: 10.0000 mg | INTRAMUSCULAR | Status: AC | PRN

## 2022-03-09 NOTE — Care Management CC44 (Signed)
Condition Code 44 Documentation Completed ? ?Patient Details  ?Name: Alyssa [name] ?MRN: [id number] ?Date of Birth: 1956/02/18 ? ? ?Condition Code 44 given:  Yes ?Patient signature on Condition Code 44 notice:  Yes ?Documentation of 2 MD's agreement:  Yes ?Code 44 added to claim:  Yes ? ? ? Michel Bickers, RN ?03/09/2022, 6:40 PM ? ?

## 2022-03-09 NOTE — Assessment & Plan Note (Addendum)
?   Concerning finding in a patient with long-term smoking history followed by Dr. Lamonte Sakai from pulmonary medicine.   ?? Recent CT scan read as having an ~11 x 7 cm nodule.  Probably more consistent with a millimeters based on review.   ?? Regardless with hemoptysis and pulmonary nodules, in discussion with internal medicine, they were concerned that the patient may need bronchoscopy. ?

## 2022-03-09 NOTE — Progress Notes (Incomplete)
CC: throat pain  HPI:  8 YOF with no pertinent PMH came to the ED with acute onset, throat pain bilaterally radiating to her ears and throat burning. She was diagnosed with an atypical presentation of inferolateral STEMI. ECG showed prominent ST elevations but hsTn isn't elevated (4). She reported some hemoptysis PTA but attributes it to allergies.   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   Dyspnea    sob with heavy activity   Headache    Pain in limb    Primary localized osteoarthrosis, lower leg    oa   Wears dentures top   Wears glasses       Family History  Problem Relation Age of Onset   Stroke Mother    Hypertension Father    Breast cancer Paternal Aunt     Social History   Socioeconomic History   Marital status: Married    Spouse name: Not on file   Number of children: Not on file   Years of education: Not on file   Highest education level: Not on file  Occupational History   Not on file  Tobacco Use   Smoking status: Every Day    Packs/day: 1.50    Years: 40.00    Pack years: 60.00    Types: Cigarettes   Smokeless tobacco: Never   Tobacco comments:    10 cigarettes smoked daily ARJ 02/20/22  Vaping Use   Vaping Use: Never used  Substance and Sexual Activity   Alcohol use: No   Drug use: No   Sexual activity: Not Currently  Other Topics Concern   Not on file  Social History  Narrative   Not on file   Social Determinants of Health   Financial Resource Strain: Not on file  Food Insecurity: Not on file  Transportation Needs: Not on file  Physical Activity: Not on file  Stress: Not on file  Social Connections: Not on file     Today's Vitals   03/09/22 0400 03/09/22 0500 03/09/22 0600 03/09/22 0739  BP: 106/72 123/82 135/65   Pulse: 63 63 69   Resp: 15 15 16    Temp: 97.7 F (36.5 C)   97.8 F (36.6 C)  TempSrc: Oral   Oral  SpO2: 96% 96% 98%   Weight: 74.5 kg (164 lb 3.9 oz)     Height:      PainSc: 0-No pain 0-No pain 0-No pain    Body mass index is 27.33 kg/m.   Labs:  Imaging: coronary angio unremarkable; CT 02/07/22 noted numerous irregular pulm nodules  Studies: ECG demonstrates NSR, Qtc 436, STE; LHC mild CAD and EF > 65% Medications:  A/P  STEMI: ECG with STE; hsTn  4; LDL 97, A1c 5.3, SBPs <140, HR 60-70s; CBC+BMP unremarkable, EF 60-65% Possible pericarditis but no chest discomfort GDMT: [id number], Lipitor 20mg  -continue ASA81 -started on lipitor 20  -increase to 40mg ? -F/u ECHO  Throat pain: Tmax 98.3; Strep A/MRSA/Resp PCRs all negative; WBC 6.8 Pericarditis vs viral etiology vs PE?  VTE Ppx?      Anticoag:   ID:   Antimicrobials this admission:  *** *** >> *** *** *** >> ***  Microbiology results: *** BCx: *** *** UCx: ***  *** Sputum: ***  *** Resp. Panel: *** *** MRSA PCR: ***  CV:   Endocrine:   GI/Nutrition: cardiac diet,   Neuro:   Renal:   Pulm:   Heme/Onc:   PTA Med Issues: Vit C, Vit D3, Dilaudid 2mg  q4h PRN, tramadol 50mg  q6h PRN,   Additional Notes:

## 2022-03-09 NOTE — Care Management Obs Status (Signed)
MEDICARE OBSERVATION STATUS NOTIFICATION ? ? ?Patient Details  ?Name: Alyssa [name] ?MRN: [id number] ?Date of Birth: February 20, 1956 ? ? ?Medicare Observation Status Notification Given:  Yes ? ? ? Michel Bickers, RN ?03/09/2022, 6:40 PM ?

## 2022-03-09 NOTE — Assessment & Plan Note (Addendum)
?   ST elevations may just be her normal baseline.  There is no evidence on echocardiogram or symptomatic evidence of pericarditis.  Coronary arteries were essentially normal.  Nothing to explain EKG findings. ??  ?? No further cardiac evaluation necessary. ?

## 2022-03-09 NOTE — Consult Note (Addendum)
NAME:  Alyssa [name], MRN:  [id number], DOB:  July 24, 1956, LOS: 1 ADMISSION DATE:  03/08/2022, CONSULTATION DATE:  3/10 REFERRING MD:  Dr. Herbie Baltimore, CHIEF COMPLAINT:  Throat Pain    History of Present Illness:  66 y/o F who presented to [facility] with reports of acute onset bilateral throat pain.   She is a known longstanding smoker.  She is followed by Dr. 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  She has smoked 1ppd x40 years but has cut back to 1/2ppd in the few weeks preceding admission.  She was initially referred to pulmonary for lung cancer screening.  In addition, her dentist noted adenopathy and recommended she have a CT neck which was completed on 2/8 which was negative.  She had GYN surgery around Valentines day and required general anesthesia.    She called the Pulmonary office on 3/3 with reports of coughing up blood and was instructed to use a nasal spray and antihistamine which has improved symptoms.  No other episodes since. Noted she had a bloody drainage from her nose as well at that time which has resolved.    The patient woke around 3am on 3/9 with acute onset bilateral throat pain.  The pain was only in her throat.  She denied chest pain or pressure.  The intensity of the pain worsened around 4pm on 3/9 and she called her PCP office.  They instructed her to go the ER for evaluation.  Her father was recently sick with the same complaints - intense sore throat, hoarseness and has since improved. In ER, her EKG was concerning for prominent ST elevation with reciprocal depression.  She was admitted for [facility] which did not reveal any focal stenosis, wall motion abnormality or aortic dissection.  COVID testing was negative.  After LHC, she was ruled out for obstructive disease and it was felt she likely had a sore throat with viral URI.    PCCM consulted for evaluation of hemoptysis.     Pertinent  Medical History  Bright's Disease  Pulmonary Nodules Tobacco / THC Abuse  Significant Hospital Events: Including procedures, antibiotic start and stop dates in addition to other pertinent events   3/9 Admit per Cardiology with c/o bilateral throat pain  Interim History / Subjective:  As above  Patient asking when she can go home  Objective   Blood pressure (!) 141/69, pulse 93, temperature 99.3 F (37.4 C), temperature source Oral, resp. rate 16, height 5\' 5"  (1.651 m), weight 74.5 kg, SpO2 92 %.        Intake/Output Summary (Last 24 hours) at 03/09/2022 1239 Last data filed at 03/09/2022 0800 Gross per 24 hour  Intake 550.11 ml  Output 850 ml  Net -299.89 ml   Filed Weights   03/08/22 2356 03/09/22 0100 03/09/22 0400  Weight: 75.3 kg 74.5 kg 74.5 kg    Examination: General: well appearing adult female sitting up in chair in NAD HENT: MM pink/moist, upper dentures, no posterior pharynx erythema or lesions, nares without drainage, anicteric  Lungs: non-labored at rest on RA, lungs bilaterally clear  Cardiovascular: s1s2 RRR, no m/r/g Abdomen: soft, non-tender, tolerating PO's Extremities: warm/dry, no peripheral edema  Neuro: AAOx4, speech clear, MAE   HEMOPTYSIS FROM 3/2 EPISODE    Resolved Hospital Problem list      Assessment & Plan:   Hemoptysis  Tobacco Abuse  THC Abuse  Bilateral Throat Pain  Pulmonary Nodules Bilateral Upper Lobe GGO Suspect episode of hemoptysis on 3/2 was related to recent multiple intubations for surgery, baseline ASA use and flare of seasonal allergies, +/- viral URI with known sick contact.  She has had improved symptoms on allergy medications and no further episodes of hemoptysis since 3/2.  She is not on oxygen and has no other associated respiratory symptoms at this time. Of note, she did have some faint bilateral upper lobe ground glass opacities on CT.    -continue flonase as outpatient  -continue OTC zyrtex or  2nd generation antihistamine of choice -patient instructed to call the office if she has recurrent episodes of hemoptysis or report to ER if large volume -follow up with Dr. Delton Coombes for close monitoring of pulmonary nodules & GGO given she is at risk for lung cancer with her smoking history -smoking cessation encouraged -cepacol spray for sore throat   Best Practice (right click and "Reselect all SmartList Selections" daily)  Per Primary   Labs   CBC: Recent Labs  Lab 03/08/22 1810 03/09/22 0058  WBC 8.7 6.8  NEUTROABS 6.4  --   HGB 13.4 12.2  HCT 40.7 37.5  MCV 90.6 89.9  PLT 259 225    Basic Metabolic Panel: Recent Labs  Lab 03/08/22 1810 03/09/22 0058  NA 136 133*  K 4.0 3.7  CL 100 98  CO2 26 25  GLUCOSE 126* 108*  BUN 14 14  CREATININE 0.68 0.60  CALCIUM 8.9 8.5*   GFR: Estimated Creatinine Clearance: 69.9 mL/min (by C-G formula based on SCr of 0.6 mg/dL). Recent Labs  Lab 03/08/22 1810 03/09/22 0058  WBC 8.7 6.8    Liver Function Tests: No results for input(s): AST, ALT, ALKPHOS, BILITOT, PROT, ALBUMIN in the last 168 hours. No results for input(s): LIPASE, AMYLASE in the last 168 hours. No results for input(s): AMMONIA in the last 168 hours.  ABG No results found for: PHART, PCO2ART, PO2ART, HCO3, TCO2, ACIDBASEDEF, O2SAT   Coagulation Profile: No results for input(s): INR, PROTIME in the last 168 hours.  Cardiac Enzymes: No results for input(s): CKTOTAL, CKMB, CKMBINDEX, TROPONINI in the last 168 hours.  HbA1C: Hgb A1c MFr Bld  Date/Time Value Ref Range Status  03/09/2022 12:58 AM 5.3 4.8 - 5.6 % Final    Comment:    (NOTE) Pre diabetes:          5.7%-6.4%  Diabetes:              >6.4%  Glycemic control for   <7.0% adults with diabetes     CBG: No results for input(s): GLUCAP in the last 168 hours.  Review of Systems: Positives in [place]   Gen: Denies fever, chills, weight change, fatigue, night sweats HEENT: Denies blurred vision,  double vision, hearing loss, tinnitus, sinus congestion, rhinorrhea, sore throat, neck stiffness, dysphagia PULM: Denies shortness of breath, cough, sputum production, sore throat, hemoptysis 3 weeks prior to admit, wheezing CV: Denies chest pain, edema, orthopnea, paroxysmal nocturnal dyspnea, palpitations GI: Denies abdominal pain, nausea, vomiting, diarrhea, hematochezia, melena, constipation, change in bowel habits GU: Denies dysuria, hematuria, polyuria, oliguria, urethral discharge Endocrine: Denies hot or cold intolerance, polyuria, polyphagia or appetite change Derm: Denies rash, dry skin, scaling or peeling skin change Heme: Denies easy bruising, bleeding, bleeding gums Neuro: Denies headache, numbness, weakness, slurred speech, loss of memory or consciousness  Past Medical History:  She,  has a past medical history of Bright's disease (  age 89), Headache, Marijuana abuse, Primary localized osteoarthrosis, lower leg, Tobacco abuse, Wears dentures (top), and Wears glasses.   Surgical History:   Past Surgical History:  Procedure Laterality Date   ABDOMINAL HYSTERECTOMY  yrs ago   1 ovary left   ANTERIOR AND POSTERIOR REPAIR N/A 05/30/2021   Procedure: ANTERIOR (CYSTOCELE) AND POSTERIOR REPAIR (RECTOCELE);  Surgeon: Lavina HammanMeisinger, Todd, MD;  [facility] [place];  Service: Gynecology;  Laterality: N/A;   BACK SURGERY  yrs ago   x3 bolts  and screws and rods x 2 lower back   HEMORRHOID SURGERY  yrs ago   LEFT HEART CATH AND CORONARY ANGIOGRAPHY N/A 03/08/2022   Procedure: LEFT HEART CATH AND CORONARY ANGIOGRAPHY;  Surgeon: Kathleene HazelMcAlhany, Christopher D, MD;  Location: [facility];  Service: Cardiovascular;  Laterality: N/A;   ROBOTIC ASSISTED LAPAROSCOPIC SACROCOLPOPEXY N/A 02/13/2022   Procedure: XI ROBOTIC ASSISTED LAPAROSCOPIC LYSIS OF ADHESIONS;  Surgeon: Marguerita BeardsSchroeder, Michelle N, MD;  [facility] [place];  Service: Gynecology;  Laterality: N/A;   VAGINAL  PROLAPSE REPAIR N/A 10/19/2021   Procedure: VAGINAL VAULT SUSPENSION;  Surgeon: Lavina HammanMeisinger, Todd, MD;  [facility] [place];  Service: Gynecology;  Laterality: N/A;   VULVAR LESION REMOVAL N/A 05/30/2021   Procedure: REMOVAL OF VULVAR CYST;  Surgeon: Lavina HammanMeisinger, Todd, MD;  [facility] [place];  Service: Gynecology;  Laterality: N/A;     Social History:   reports that she has been smoking cigarettes. She has a 60.00 pack-year smoking history. She has never used smokeless tobacco. She reports that she does not drink alcohol and does not use drugs.   Family History:  Her family history includes Breast cancer in her paternal aunt; Hypertension in her father; Stroke in her mother.   Allergies Allergies  Allergen Reactions   Bee Venom Anaphylaxis   Arthrotec [Diclofenac-Misoprostol]     Not sure   Codeine Itching    headache   Cymbalta [Duloxetine Hcl]     "felt I was sinking into the floor"   Effexor [Venlafaxine Hydrochloride]     confusion   Escitalopram Oxalate     confusion   Hydrocodone     Severe headache, itching   Neurontin [Gabapentin]     Not sure   Oxymorphone Hcl     "It didn't work"   Pentazocine Lactate     Not sure   Provigil [Modafinil]     Not sure   Sertraline Hcl     More depressed     Home Medications  Prior to Admission medications   Medication Sig Start Date End Date Taking? Authorizing Provider  aspirin EC 81 MG tablet Take 81 mg by mouth daily. Swallow whole.   Yes [provider]  ibuprofen (ADVIL) 600 MG tablet Take 1 tablet (600 mg total) by mouth every 6 (six) hours as needed. 03/01/22  Yes Marguerita BeardsSchroeder, Michelle N, MD  acetaminophen (TYLENOL) 500 MG tablet Take 1 tablet (500 mg total) by mouth every 6 (six) hours as needed (pain). 03/01/22   Marguerita BeardsSchroeder, Michelle N, MD  ALPRAZolam Prudy Feeler(XANAX) 1 MG tablet Take 1 mg by mouth at bedtime as needed for anxiety.    [provider]  ascorbic acid (VITAMIN C) 500 MG  tablet Take 500 mg by mouth daily.    [provider]  Cholecalciferol (D3 MAXIMUM STRENGTH) 125 MCG (5000 UT) capsule Take 5,000 Units by mouth daily.    [provider]  fluticasone (FLONASE) 50 MCG/ACT nasal spray Place  2 sprays into both nostrils daily. 03/02/22   Leslye Peer, MD  HYDROmorphone (DILAUDID) 2 MG tablet Take 1 tablet (2 mg total) by mouth every 4 (four) hours as needed for severe pain. 03/01/22   Marguerita Beards, MD  polyethylene glycol powder (GLYCOLAX/MIRALAX) 17 GM/SCOOP powder Take 17 g by mouth daily. Drink 17g (1 scoop) dissolved in water per day. 03/01/22   Marguerita Beards, MD  pregabalin (LYRICA) 100 MG capsule Take 100 mg by mouth 3 (three) times daily.    [provider]  traMADol (ULTRAM) 50 MG tablet Take by mouth every 6 (six) hours as needed.    [provider]     Critical care time: n/a     Canary Brim, MSN, APRN, NP-C, AGACNP-BC [facility] 03/09/2022, 12:39 PM   Please see [email or web address] for pager details.   From 7A-7P if no response, please call [phone] After hours, please call Pola Corn [phone]   pulmonary critical care attending:    This is a 66 year old female presented with throat pain.  Patient longstanding smoker.  Followed entities lung cancer screening program and with Dr. Delton Coombes.  Had recent follow-up in the office within 3 months super D CT planned.  She had sore throat pain and came into the ER for evaluation had abnormal EKG tracings was brought to the hospital for evaluation and ultimately had a left heart catheterization that did not reveal any focal stenosis.  COVID testing negative.  Felt to have viral upper respiratory tract infection and had an episode of hemoptysis approximately 1 week ago.  Pulmonary was asked on their opinion of what to do with the hemoptysis.  BP 116/65    Pulse 88    Temp 98.2 F (36.8 C) (Oral)    Resp 20    Ht 5\' 5"  (1.651 m)    Wt 74.5 kg     SpO2 94%    BMI 27.33 kg/m   General: Elderly female, chronically ill-appearing, longstanding smoker HEENT: NCAT, tracking appropriately Lungs: Clear to auscultation bilaterally no crackles no wheeze, diminished breath sounds bilaterally Heart: Regular S1-S2 Abdomen: Soft nontender nondistended  Labs: Reviewed  Assessment: Hemoptysis. She has not had any issues for the past week.  Possibly related to upper airway bleeding or nasal passage bleeding.  This only happened 1 time. She has multiple pulmonary nodules most are groundglass in nature with plans for follow-up with her.  Plan: No intervention needed at this time for her hemoptysis. Continue to observe If it recurs she should let know. She can call the pulmonary Let us know. Talk to the patient about this this evening. From our standpoint she would be stable for discharge.  [place], DO [facility] 03/09/2022 6:02 PM

## 2022-03-09 NOTE — Assessment & Plan Note (Addendum)
?   Group A strep negative.  Viral studies pending.   ?? Family is concerned about possible esophageal issues.  Soft tissue CT appears to be relatively benign. ? ?? I have consulted internal medicine and PCCM to assist with evaluation of the throat pain as well as hemoptysis.   ?? Until these issues have been fully better, I do not feel comfortable discharging despite being stable from a cardiac standpoint. ?

## 2022-03-09 NOTE — Discharge Summary (Addendum)
Discharge Summary    Patient ID: Alyssa [name] MRN: [id number]; DOB: 10/30/1956  Admit date: 03/08/2022 Discharge date: 03/09/2022  PCP:  Geoffry Paradise, MD   [facility] Providers Cardiologist:  New to [facility] - Dr. Clifton James     Discharge Diagnoses    Principal Problem:   Throat pain in adult Active Problems:   Pulmonary nodules   Tobacco use   ST elevation - in absence of Coronary Artery Disease   Cough with hemoptysis    Diagnostic Studies/Procedures    Cath 03/08/2022   1st Diag lesion is 30% stenosed.   Prox LAD to Mid LAD lesion is 20% stenosed.   The left ventricular systolic function is normal.   LV end diastolic pressure is normal.   The left ventricular ejection fraction is greater than 65% by visual estimate.   There is no mitral valve regurgitation.   Mild non-obstructive disease in the Diagonal/mid LAD Moderate caliber Circumflex with no obstructive disease Large dominant RCA with no obstructive disease Hyperdynamic LV systolic function No evidence of dilated aortic root or ascending aorta dissection   Recommendations: Will monitor overnight. Consider other causes of neck pain, chest burning and abnormal EKG.    Echo 03/09/2022 1. Left ventricular ejection fraction, by estimation, is 60 to 65%. The  left ventricle has normal function. The left ventricle has no regional  wall motion abnormalities. Left ventricular diastolic parameters were  normal. The average left ventricular  global longitudinal strain is -18.0 %. The global longitudinal strain is  normal.   2. Right ventricular systolic function is normal. The right ventricular  size is normal. Tricuspid regurgitation signal is inadequate for assessing  PA pressure.   3. The mitral valve is normal in structure. No evidence of mitral valve  regurgitation. No evidence of mitral stenosis.   4. The aortic valve was not well visualized. Aortic valve regurgitation  is not visualized.   5. The  inferior vena cava is normal in size with greater than 50%  respiratory variability, suggesting right atrial pressure of 3 mmHg.   Comparison(s): No prior Echocardiogram.   _____________   History of Present Illness     Alyssa [name] is a 66 y.o. female with  tobacco and marijuana use who presents with throat pain and ECG c/w inferolateral STEMI.   Alyssa [name] reports that around 3 AM this morning (03/09) she woke up with acute onset bilateral throat pain that was 7/10 duration and nonradiating.  She denied any chest pain or chest pressure but had ongoing throat pain throughout the day that varied in intensity but worsened around 4 PM.  She called her physician's office and nurse on call directed her to the emergency department for further evaluation.  During my evaluation she was still having 6/10 severity throat pain but denied any shortness of breath, dyspnea on exertion, chest discomfort, back pain, orthopnea or PND.  She has a long history of tobacco use smoking on average 1 pack/day for the last 40 years which she has recently cut back to around half pack a day over the past few weeks.  She denies any prior coronary history or cardiac evaluation in the past.  She has never had prior echo or stress testing.  Hospital Course     Consultants: Hospitalist Dr. Chipper Herb  and Pulmonology service   Patient presented with atypical anginal equivalent of throat pain throughout the day.  Initial EKG was concerning for inferolateral STEMI.  Code STEMI was activated and the  patient was taken emergently to the Cath Lab.  Subsequent cardiac catheterization only showed mild coronary artery disease with 30% D1, 20% proximal to mid LAD, EF 65%.  LV gram did not reveal any wall motion abnormality or aortic dissection.  Her throat pain was persistent and possibly related to viral infection.  COVID test negative.  She had recent CT image of both neck and the chest that showed numerous irregular solid pulmonary  nodules.  Echocardiogram obtained on 03/09/2022 showed EF 60 to 65%, normal LV diastolic parameter.  Internal medicine and pulmonary critical care service were consulted for her bilateral throat pain.  Her pulmonary nodules are followed by Dr. Delton Coombes we will plan to repeat a CT imaging in 3 months.  She previously reported to pulmonology service with hemoptysis on 3/2 that was felt to be related to recent multiple intubations for surgery, baseline aspirin use and a flare of seasonal allergies +/- viral URI.  She has not had any hemoptysis since 3/2.  She does not require oxygen and has no other respiratory symptoms.  Pulmonary critical care service recommended continue Flonase as outpatient.  Continue OTC Zyrtec or second-generation antihistamine of choice.  She has been instructed to call pulmonology service if she has very recurrent episode of hemoptysis or report to ER if large volume.  She will continue to follow-up with Dr. Delton Coombes for close monitoring of pulmonary nodules.  As for her sore throat, recent CT scan of neck soft tissue did not show any significant acute finding.  Examination did not show rash or mass, patient has normal swallowing and breathing.  Internal medicine service felt that her sore throat symptom correlates with postnasal drip and sinusitis.  It is recommended she complete at least 4 weeks treatment of Flonase and follow-up with ENT as outpatient.    Patient was seen by Dr. Herbie Baltimore on 03/09/2022 at which time she was doing well without any acute complaint.  She is deemed stable for discharge from the cardiac perspective.  Dr. Herbie Baltimore felt she does not need cardiology follow-up at this time. Only new medication in the hospital is low dose lipitor, this can be followed up by PCP.     Did the patient have an acute coronary syndrome (MI, NSTEMI, STEMI, etc) this admission?:  No                               Did the patient have a percutaneous coronary intervention (stent / angioplasty)?:   No.          _____________  Discharge Vitals Blood pressure 116/65, pulse 88, temperature 98.2 F (36.8 C), temperature source Oral, resp. rate 20, height  (1.651 m), weight 74.5 kg, SpO2 94 %.  Filed Weights   03/08/22 2356 03/09/22 0100 03/09/22 0400  Weight: 75.3 kg 74.5 kg 74.5 kg    Labs & Radiologic Studies    CBC Recent Labs    03/08/22 1810 03/09/22 0058  WBC 8.7 6.8  NEUTROABS 6.4  --   HGB 13.4 12.2  HCT 40.7 37.5  MCV 90.6 89.9  PLT 259 225   Basic Metabolic Panel Recent Labs    29/56/21 1810 03/09/22 0058  NA 136 133*  K 4.0 3.7  CL 100 98  CO2 26 25  GLUCOSE 126* 108*  BUN 14 14  CREATININE 0.68 0.60  CALCIUM 8.9 8.5*   Liver Function Tests No results for input(s): AST, ALT, ALKPHOS, BILITOT,  PROT, ALBUMIN in the last 72 hours. No results for input(s): LIPASE, AMYLASE in the last 72 hours. High Sensitivity Troponin:   Recent Labs  Lab 03/08/22 2245 03/09/22 0058  TROPONINIHS 4 4    BNP Invalid input(s): POCBNP D-Dimer No results for input(s): DDIMER in the last 72 hours. Hemoglobin A1C Recent Labs    03/09/22 0058  HGBA1C 5.3   Fasting Lipid Panel Recent Labs    03/09/22 0058  CHOL 166  HDL 58  LDLCALC 97  TRIG 57  CHOLHDL 2.9   Thyroid Function Tests Recent Labs    03/09/22 0058  TSH 1.148   _____________  DG Chest 2 View  Result Date: 03/08/2022 CLINICAL DATA:  Hemoptysis EXAM: CHEST - 2 VIEW COMPARISON:  Chest CT 02/06/2022 FINDINGS: The heart size and mediastinal contours are within normal limits. Both lungs are clear. The visualized skeletal structures are unremarkable. IMPRESSION: No active cardiopulmonary disease. Electronically Signed   By: Jasmine Pang M.D.   On: 03/08/2022 18:47   CT SOFT TISSUE NECK W CONTRAST  Result Date: 02/27/2022 CLINICAL DATA:  Cervical adenopathy. Posterior cervical muscle atrophy. EXAM: CT NECK WITH CONTRAST TECHNIQUE: Multidetector CT imaging of the neck was performed using the  standard protocol following the bolus administration of intravenous contrast. RADIATION DOSE REDUCTION: This exam was performed according to the departmental dose-optimization program which includes automated exposure control, adjustment of the mA and/or kV according to patient size and/or use of iterative reconstruction technique. CONTRAST:  50mL ISOVUE-300 IOPAMIDOL (ISOVUE-300) INJECTION 61% COMPARISON:  None. FINDINGS: Pharynx and larynx: No focal mucosal or submucosal lesions are present. The nasopharynx is clear. Soft palate and tongue base are within normal limits. Vallecula and epiglottis are within normal limits. Aryepiglottic folds and piriform sinuses are clear. Vocal cords are midline and symmetric. Trachea is clear. Salivary glands: The submandibular and parotid glands and ducts are within normal limits. Thyroid: Mild heterogeneity is present without a discrete significant nodule. Lymph nodes: No significant adenopathy is present. Vascular: Atherosclerotic changes are present at the carotid bifurcations bilaterally, right greater than left without a significant stenosis relative to the more distal vessel. Atherosclerotic changes are present at the aortic arch without a significant stenosis. No aneurysm is present. Limited intracranial: Within normal limits for age. Visualized orbits: The globes and orbits are within normal limits. Mastoids and visualized paranasal sinuses: The paranasal sinuses and mastoid air cells are clear. Skeleton: Degenerative changes of the cervical spine are most evident at C5-6 with left greater than right uncovertebral spurring and foraminal narrowing. Exaggerated cervical lordosis is present. Vertebral body heights are maintained. No focal osseous lesions are present. Upper chest: Mild peripheral interstitial changes are present. No nodule or mass lesion is present. No significant pleural disease is present. IMPRESSION: 1. No acute or focal lesion to explain the patient's  symptoms. 2. No significant adenopathy. 3. Mild generalized muscle atrophy. No discrete lesion or significant asymmetry. 4. Degenerative changes of the cervical spine are most evident at C5-6 with left greater than right uncovertebral spurring and foraminal narrowing. 5. Aortic Atherosclerosis (ICD10-I70.0). Electronically Signed   By: Marin Roberts M.D.   On: 02/27/2022 15:18   CARDIAC CATHETERIZATION  Result Date: 03/09/2022   1st Diag lesion is 30% stenosed.   Prox LAD to Mid LAD lesion is 20% stenosed.   The left ventricular systolic function is normal.   LV end diastolic pressure is normal.   The left ventricular ejection fraction is greater than 65% by visual estimate.  There is no mitral valve regurgitation. Mild non-obstructive disease in the Diagonal/mid LAD Moderate caliber Circumflex with no obstructive disease Large dominant RCA with no obstructive disease Hyperdynamic LV systolic function No evidence of dilated aortic root or ascending aorta dissection Recommendations: Will monitor overnight. Consider other causes of neck pain, chest burning and abnormal EKG.   MM 3D SCREEN BREAST BILATERAL  Result Date: 03/05/2022 CLINICAL DATA:  Screening. EXAM: DIGITAL SCREENING BILATERAL MAMMOGRAM WITH TOMOSYNTHESIS AND CAD TECHNIQUE: Bilateral screening digital craniocaudal and mediolateral oblique mammograms were obtained. Bilateral screening digital breast tomosynthesis was performed. The images were evaluated with computer-aided detection. COMPARISON:  Previous exam(s). ACR Breast Density Category b: There are scattered areas of fibroglandular density. FINDINGS: There are no findings suspicious for malignancy. IMPRESSION: No mammographic evidence of malignancy. A result letter of this screening mammogram will be mailed directly to the patient. RECOMMENDATION: Screening mammogram in one year. (Code:SM-B-01Y) BI-RADS CATEGORY  1: Negative. Electronically Signed   By: Gerome Sam III M.D.   On:  03/05/2022 15:39   ECHOCARDIOGRAM COMPLETE  Result Date: 03/09/2022    ECHOCARDIOGRAM REPORT   Patient Name:   Alyssa [name] Date of Exam: 03/09/2022 Medical Rec #:  [id number]        Height:       65.0 in Accession #:    [id number]       Weight:       164.2 lb Date of Birth:  Mar 04, 1956         BSA:          1.819 m Patient Age:    65 years         BP:           147/71 mmHg Patient Gender: F                HR:           84 bpm. Exam Location:  Inpatient Procedure: 2D Echo and Strain Analysis Indications:    chest pain  History:        Patient has no prior history of Echocardiogram examinations.                 CAD, COPD; Risk Factors:Current Smoker.  Sonographer:    Delcie Roch RDCS Referring Phys: [id number] CHRISTOPHER D MCALHANY IMPRESSIONS  1. Left ventricular ejection fraction, by estimation, is 60 to 65%. The left ventricle has normal function. The left ventricle has no regional wall motion abnormalities. Left ventricular diastolic parameters were normal. The average left ventricular global longitudinal strain is -18.0 %. The global longitudinal strain is normal.  2. Right ventricular systolic function is normal. The right ventricular size is normal. Tricuspid regurgitation signal is inadequate for assessing PA pressure.  3. The mitral valve is normal in structure. No evidence of mitral valve regurgitation. No evidence of mitral stenosis.  4. The aortic valve was not well visualized. Aortic valve regurgitation is not visualized.  5. The inferior vena cava is normal in size with greater than 50% respiratory variability, suggesting right atrial pressure of 3 mmHg. Comparison(s): No prior Echocardiogram. FINDINGS  Left Ventricle: Left ventricular ejection fraction, by estimation, is 60 to 65%. The left ventricle has normal function. The left ventricle has no regional wall motion abnormalities. The average left ventricular global longitudinal strain is -18.0 %. The global longitudinal strain is normal. The  left ventricular internal cavity size was normal in size. There is no left ventricular hypertrophy. Left ventricular diastolic parameters were normal.  Right Ventricle: The right ventricular size is normal. No increase in right ventricular wall thickness. Right ventricular systolic function is normal. Tricuspid regurgitation signal is inadequate for assessing PA pressure. Left Atrium: Left atrial size was normal in size. Right Atrium: Right atrial size was normal in size. Pericardium: Trivial pericardial effusion is present. Presence of epicardial fat layer. Mitral Valve: The mitral valve is normal in structure. No evidence of mitral valve regurgitation. No evidence of mitral valve stenosis. Tricuspid Valve: The tricuspid valve is normal in structure. Tricuspid valve regurgitation is not demonstrated. No evidence of tricuspid stenosis. Aortic Valve: The aortic valve was not well visualized. Aortic valve regurgitation is not visualized. Pulmonic Valve: The pulmonic valve was not well visualized. Pulmonic valve regurgitation is not visualized. No evidence of pulmonic stenosis. Aorta: The aortic root and ascending aorta are structurally normal, with no evidence of dilitation. Venous: The inferior vena cava is normal in size with greater than 50% respiratory variability, suggesting right atrial pressure of 3 mmHg. IAS/Shunts: No atrial level shunt detected by color flow Doppler.  LEFT VENTRICLE PLAX 2D LVIDd:         4.20 cm   Diastology LVIDs:         2.60 cm   LV e' medial:    10.30 cm/s LV PW:         0.80 cm   LV E/e' medial:  7.9 LV IVS:        0.60 cm   LV e' lateral:   17.30 cm/s LVOT diam:     1.90 cm   LV E/e' lateral: 4.7 LV SV:         57 LV SV Index:   31        2D Longitudinal Strain LVOT Area:     2.84 cm  2D Strain GLS Avg:     -18.0 %  RIGHT VENTRICLE             IVC RV S prime:     21.80 cm/s  IVC diam: 1.50 cm TAPSE (M-mode): 2.7 cm LEFT ATRIUM             Index        RIGHT ATRIUM           Index LA  diam:        3.40 cm 1.87 cm/m   RA Area:     12.20 cm LA Vol (A2C):   56.0 ml 30.78 ml/m  RA Volume:   27.70 ml  15.23 ml/m LA Vol (A4C):   37.6 ml 20.67 ml/m LA Biplane Vol: 45.9 ml 25.23 ml/m  AORTIC VALVE LVOT Vmax:   111.00 cm/s LVOT Vmean:  74.400 cm/s LVOT VTI:    0.200 m  AORTA Ao Root diam: 2.90 cm Ao Asc diam:  3.10 cm MITRAL VALVE MV Area (PHT): 4.15 cm    SHUNTS MV Decel Time: 183 msec    Systemic VTI:  0.20 m MV E velocity: 81.80 cm/s  Systemic Diam: 1.90 cm MV A velocity: 79.90 cm/s MV E/A ratio:  1.02 Riley Lam MD Electronically signed by Riley Lam MD Signature Date/Time: 03/09/2022/11:24:48 AM    Final    Disposition   Pt is being discharged home today in good condition.  Follow-up Plans & Appointments     Follow-up Information     Kathleene Hazel, MD Follow up.   Specialty: Cardiology Why: see cardiology only on an as needed basis. Otherwise, no need to follow up at  this time given the recent reassuring work up in the hospital Contact information: [address] [place] [address] [phone]         Geoffry ParadiseAronson, Richard, MD Follow up.   Specialty: Internal Medicine Why: contact PCP or ENT doctor if throat pain does not improve. Internal medicine doctor in the hospital recommended 4 weeks of flonase nasal spray Contact information: [address] [place] [address] [phone]         Leslye PeerByrum, Robert S, MD Follow up on 05/22/2022.   Specialty: Pulmonary Disease Why: @3 :30PM. follow up with pulmonology as outpatient regarding lung nodule Contact information: [address] [place] [address] [phone]                Discharge Instructions     Diet - low sodium heart healthy   Complete by: As directed    Increase activity slowly   Complete by: As directed        Discharge Medications   Allergies as of 03/09/2022       Reactions   Bee Venom Anaphylaxis   Arthrotec  [diclofenac-misoprostol]    Not sure   Codeine Itching   headache   Cymbalta [duloxetine Hcl]    "felt I was sinking into the floor"   Effexor [venlafaxine Hydrochloride]    confusion   Escitalopram Oxalate    confusion   Hydrocodone    Severe headache, itching   Neurontin [gabapentin]    Not sure   Oxymorphone Hcl    "It didn't work"   Pentazocine Lactate    Not sure   Provigil [modafinil]    Not sure   Sertraline Hcl    More depressed        Medication List     TAKE these medications    acetaminophen 500 MG tablet Commonly known as: TYLENOL Take 1 tablet (500 mg total) by mouth every 6 (six) hours as needed (pain).   ALPRAZolam 1 MG tablet Commonly known as: XANAX Take 1 mg by mouth at bedtime as needed for anxiety.   ascorbic acid 500 MG tablet Commonly known as: VITAMIN C Take 500 mg by mouth daily.   aspirin EC 81 MG tablet Take 81 mg by mouth daily. Swallow whole.   atorvastatin 20 MG tablet Commonly known as: LIPITOR Take 1 tablet (20 mg total) by mouth daily. Start taking on: March 10, 2022   D3 Maximum Strength 125 MCG (5000 UT) capsule Generic drug: Cholecalciferol Take 5,000 Units by mouth daily.   fluticasone 50 MCG/ACT nasal spray Commonly known as: FLONASE Place 2 sprays into both nostrils daily.   HYDROmorphone 2 MG tablet Commonly known as: Dilaudid Take 1 tablet (2 mg total) by mouth every 4 (four) hours as needed for severe pain.   ibuprofen 600 MG tablet Commonly known as: ADVIL Take 1 tablet (600 mg total) by mouth every 6 (six) hours as needed.   polyethylene glycol powder 17 GM/SCOOP powder Commonly known as: GLYCOLAX/MIRALAX Take 17 g by mouth daily. Drink 17g (1 scoop) dissolved in water per day.   pregabalin 100 MG capsule Commonly known as: LYRICA Take 100 mg by mouth 3 (three) times daily.   traMADol 50 MG tablet Commonly known as: ULTRAM Take by mouth every 6 (six) hours as needed.           Outstanding  Labs/Studies   Consider FLP and LFT in 6-8 weeks  Duration of Discharge Encounter   Greater than  30 minutes including physician time.  Ramond Dial, PA 03/09/2022, 5:55 PM   Patient seen and evaluated today.  No active cardiac symptoms.  Major issue with sore throat and concerns of intermittent hemoptysis.  She was seen and evaluated by both internal medicine and pulmonary medicine.  Please see separate consult notes.  Thought to be relatively stable and okay for discharge from cardiac standpoint.  From their standpoint she was okay to discharge.  Sore throat likely related to allergies, postnasal drip and smoking.  No erythema.  CT of the neck was relatively benign.  Recommend Flonase Cepacol spray and allergy medications.   No cardiology follow-up required.  She will follow-up with pulmonary medicine.    Bryan Lemma, MD

## 2022-03-09 NOTE — Assessment & Plan Note (Addendum)
?   Discussed importance of tobacco and marijuana use cessation. ?

## 2022-03-09 NOTE — Discharge Instructions (Addendum)
No lifting over 5 lbs for 1 week. No sexual activity for 1 week. Keep procedure site clean & dry. If you notice increased pain, swelling, bleeding or pus, call/return!  You may shower, but no soaking baths/hot tubs/pools for 1 week. No further restriction after 1 week ? ?Follow up with your primary care provider. No need to follow up with cardiology service. Will leave our contact info in chart to see on an as needed basis ? ?Internal medicine service recommend you complete at least 4 weeks treatment of Flonase and follow-up with ENT as outpatient. ? ?Pulmonology service recommended  ?-continue flonase as outpatient  ?-continue OTC zyrtex or 2nd generation antihistamine of choice ?-patient instructed to call the office if you have recurrent episodes of hemoptysis or report to ER if large volume ?-follow up with Dr. Delton Coombes for close monitoring of pulmonary nodules & GGO given your smoking history ?

## 2022-03-09 NOTE — Consult Note (Signed)
History and Physical    Alyssa [name] RUE:[id number] DOB: 03/20/1956 DOA: 03/08/2022  PCP: Geoffry Paradise, MD (Confirm with patient/family/NH records and if not entered, this has to be entered at [facility] point of entry) Patient coming from: Home  I have personally briefly reviewed patient's old medical records in [facility]  Chief Complaint: Throat hurts, post-nasal drip and congestion  HPI: Alyssa [name] is a 66 y.o. female with medical history significant of COPD, cigarette smoker, chronic pain syndrome, presented with worsening of throat pain and neck pain.  Patient presented to ED yesterday afternoon for worsening of throat pain and epigastric pain.  On arrival, patient vital signs were stable, nonhypoxic, blood pressure heart rate within normal limits.  Work-up showed troponin negative x1, EKG showed ST elevation on inferior leads.  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  About sore throat, patient reported she has had the symptoms at least 4 to 6 weeks, along with increased postnasal drip, nasal congestion and bilateral ear pains.  But she denies any trouble swallowing or shortness of breath.  She went to see her PCP who diagnosed her with sinusitis and started her on Flonase.  As today, she has been using Flonase for about 2 weeks with some relief mainly on her nose symptoms.  PCP also ordered CT of neck.  On February 27, 2022, patient had a CT soft tissue neck with contrast which showed no acute findings.  Patient also reported about 3 weeks ago she had 1 episode of coughing up blood clots, which she is not sure whether that was from nosebleed event with she swallowed some of the nosebleed clot versus coughing up blood.  But she never covered in blood since then.  Patient has a history of left lung mass for which she has been  following with pulmonology Dr. Edyth Gunnels, and she underwent a CT chest without contrast 1 month ago, which showed some enlargement of the left lower lobe mass. Pulmonology recommend repeat image study in May, but no offer of bronchoscopy/biopsy.   Review of Systems: As per HPI otherwise 14 point review of systems negative.    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   Chronic pain    Headache    Marijuana abuse    Primary localized osteoarthrosis, lower leg    oa   Tobacco abuse    Wears dentures top   Wears glasses     Past Surgical History:  Procedure Laterality Date   ABDOMINAL HYSTERECTOMY  yrs ago   1 ovary left   ANTERIOR AND POSTERIOR REPAIR N/A 05/30/2021   Procedure: ANTERIOR (CYSTOCELE) AND POSTERIOR REPAIR (RECTOCELE);  Surgeon: Lavina Hamman, MD;  Location: [facility];  Service: Gynecology;  Laterality: N/A;   BACK SURGERY  yrs ago   x3 bolts  and screws and rods x 2 lower back   HEMORRHOID SURGERY  yrs ago   LEFT HEART CATH AND CORONARY ANGIOGRAPHY N/A 03/08/2022   Procedure: LEFT HEART CATH AND CORONARY ANGIOGRAPHY;  Surgeon: Kathleene Hazel, MD;  Location: [facility];  Service: Cardiovascular;  Laterality: N/A;   ROBOTIC ASSISTED LAPAROSCOPIC SACROCOLPOPEXY N/A 02/13/2022   Procedure: XI ROBOTIC ASSISTED LAPAROSCOPIC LYSIS OF ADHESIONS;  Surgeon: Marguerita Beards, MD;  Location: [facility];  Service: Gynecology;  Laterality:  N/A;   VAGINAL PROLAPSE REPAIR N/A 10/19/2021   Procedure: VAGINAL VAULT SUSPENSION;  Surgeon: Lavina Hamman, MD;  Location: [facility];  Service: Gynecology;  Laterality: N/A;   VULVAR LESION REMOVAL N/A 05/30/2021   Procedure: REMOVAL OF VULVAR CYST;  Surgeon: Lavina Hamman, MD;  Location: [facility];  Service: Gynecology;  Laterality: N/A;     reports that she has been smoking cigarettes. She has a 60.00 pack-year  smoking history. She has never used smokeless tobacco. She reports that she does not drink alcohol and does not use drugs.  Allergies  Allergen Reactions   Bee Venom Anaphylaxis   Arthrotec [Diclofenac-Misoprostol]     Not sure   Codeine Itching    headache   Cymbalta [Duloxetine Hcl]     "felt I was sinking into the floor"   Effexor [Venlafaxine Hydrochloride]     confusion   Escitalopram Oxalate     confusion   Hydrocodone     Severe headache, itching   Neurontin [Gabapentin]     Not sure   Oxymorphone Hcl     "It didn't work"   Pentazocine Lactate     Not sure   Provigil [Modafinil]     Not sure   Sertraline Hcl     More depressed    Family History  Problem Relation Age of Onset   Stroke Mother    Hypertension Father    Breast cancer Paternal Aunt      Prior to Admission medications   Medication Sig Start Date End Date Taking? Authorizing Provider  aspirin EC 81 MG tablet Take 81 mg by mouth daily. Swallow whole.   Yes [provider]  ibuprofen (ADVIL) 600 MG tablet Take 1 tablet (600 mg total) by mouth every 6 (six) hours as needed. 03/01/22  Yes Marguerita Beards, MD  acetaminophen (TYLENOL) 500 MG tablet Take 1 tablet (500 mg total) by mouth every 6 (six) hours as needed (pain). 03/01/22   Marguerita Beards, MD  ALPRAZolam Prudy Feeler) 1 MG tablet Take 1 mg by mouth at bedtime as needed for anxiety.    [provider]  ascorbic acid (VITAMIN C) 500 MG tablet Take 500 mg by mouth daily.    [provider]  Cholecalciferol (D3 MAXIMUM STRENGTH) 125 MCG (5000 UT) capsule Take 5,000 Units by mouth daily.    [provider]  fluticasone (FLONASE) 50 MCG/ACT nasal spray Place 2 sprays into both nostrils daily. 03/02/22   Leslye Peer, MD  HYDROmorphone (DILAUDID) 2 MG tablet Take 1 tablet (2 mg total) by mouth every 4 (four) hours as needed for severe pain. 03/01/22   Marguerita Beards, MD  polyethylene glycol powder  (GLYCOLAX/MIRALAX) 17 GM/SCOOP powder Take 17 g by mouth daily. Drink 17g (1 scoop) dissolved in water per day. 03/01/22   Marguerita Beards, MD  pregabalin (LYRICA) 100 MG capsule Take 100 mg by mouth 3 (three) times daily.    [provider]  traMADol (ULTRAM) 50 MG tablet Take by mouth every 6 (six) hours as needed.    [provider]    Physical Exam: Vitals:   03/09/22 0900 03/09/22 1000 03/09/22 1100 03/09/22 1200  BP: 135/80 139/72 136/69 (!) 141/69  Pulse: 82 85 85 93  Resp: Temp:   99.3 F (37.4 C)   TempSrc:   Oral   SpO2: 95% 93% 93% 92%  Weight:      Height:  Constitutional: NAD, calm, comfortable Vitals:   03/09/22 0900 03/09/22 1000 03/09/22 1100 03/09/22 1200  BP: 135/80 139/72 136/69 (!) 141/69  Pulse: 82 85 85 93  Resp: 19 20 20 16   Temp:   99.3 F (37.4 C)   TempSrc:   Oral   SpO2: 95% 93% 93% 92%  Weight:      Height:       Eyes: PERRL, lids and conjunctivae normal ENMT: Mucous membranes are moist. Posterior pharynx clear of any exudate or lesions.Normal dentition.  Neck: normal, supple, no masses, no thyromegaly Respiratory: Diminished breathing sound bilaterally, no wheezing, no crackles. Normal respiratory effort. No accessory muscle use.  Cardiovascular: Regular rate and rhythm, no murmurs / rubs / gallops. No extremity edema. 2+ pedal pulses. No carotid bruits.  Abdomen: no tenderness, no masses palpated. No hepatosplenomegaly. Bowel sounds positive.  Musculoskeletal: no clubbing / cyanosis. No joint deformity upper and lower extremities. Good ROM, no contractures. Normal muscle tone.  Skin: no rashes, lesions, ulcers. No induration Neurologic: CN 2-12 grossly intact. Sensation intact, DTR normal. Strength 5/5 in all 4.  Psychiatric: Normal judgment and insight. Alert and oriented x 3. Normal mood.     Labs on Admission: I have personally reviewed following labs and imaging studies  CBC: Recent Labs  Lab  03/08/22 1810 03/09/22 0058  WBC 8.7 6.8  NEUTROABS 6.4  --   HGB 13.4 12.2  HCT 40.7 37.5  MCV 90.6 89.9  PLT 259 225   Basic Metabolic Panel: Recent Labs  Lab 03/08/22 1810 03/09/22 0058  NA 136 133*  K 4.0 3.7  CL 100 98  CO2 26 25  GLUCOSE 126* 108*  BUN 14 14  CREATININE 0.68 0.60  CALCIUM 8.9 8.5*   GFR: Estimated Creatinine Clearance: 69.9 mL/min (by C-G formula based on SCr of 0.6 mg/dL). Liver Function Tests: No results for input(s): AST, ALT, ALKPHOS, BILITOT, PROT, ALBUMIN in the last 168 hours. No results for input(s): LIPASE, AMYLASE in the last 168 hours. No results for input(s): AMMONIA in the last 168 hours. Coagulation Profile: No results for input(s): INR, PROTIME in the last 168 hours. Cardiac Enzymes: No results for input(s): CKTOTAL, CKMB, CKMBINDEX, TROPONINI in the last 168 hours. BNP (last 3 results) No results for input(s): PROBNP in the last 8760 hours. HbA1C: Recent Labs    03/09/22 0058  HGBA1C 5.3   CBG: No results for input(s): GLUCAP in the last 168 hours. Lipid Profile: Recent Labs    03/09/22 0058  CHOL 166  HDL 58  LDLCALC 97  TRIG 57  CHOLHDL 2.9   Thyroid Function Tests: Recent Labs    03/09/22 0058  TSH 1.148   Anemia Panel: No results for input(s): VITAMINB12, FOLATE, FERRITIN, TIBC, IRON, RETICCTPCT in the last 72 hours. Urine analysis:    Component Value Date/Time   COLORURINE YELLOW 01/10/2022 1959   APPEARANCEUR CLEAR 01/10/2022 1959   LABSPEC 1.015 01/10/2022 1959   PHURINE 6.0 01/10/2022 1959   GLUCOSEU NEGATIVE 01/10/2022 1959   HGBUR TRACE (A) 01/10/2022 1959   BILIRUBINUR Negative 01/11/2022 0815   KETONESUR NEGATIVE 01/10/2022 1959   PROTEINUR Negative 01/11/2022 0815   PROTEINUR NEGATIVE 01/10/2022 1959   UROBILINOGEN 0.2 01/11/2022 0815   NITRITE Negative 01/11/2022 0815   NITRITE NEGATIVE 01/10/2022 1959   LEUKOCYTESUR Negative 01/11/2022 0815   LEUKOCYTESUR NEGATIVE 01/10/2022 1959     Radiological Exams on Admission: DG Chest 2 View  Result Date: 03/08/2022 CLINICAL DATA:  Hemoptysis EXAM: CHEST -  2 VIEW COMPARISON:  Chest CT 02/06/2022 FINDINGS: The heart size and mediastinal contours are within normal limits. Both lungs are clear. The visualized skeletal structures are unremarkable. IMPRESSION: No active cardiopulmonary disease. Electronically Signed   By: Jasmine Pang M.D.   On: 03/08/2022 18:47   CARDIAC CATHETERIZATION  Result Date: 03/09/2022   1st Diag lesion is 30% stenosed.   Prox LAD to Mid LAD lesion is 20% stenosed.   The left ventricular systolic function is normal.   LV end diastolic pressure is normal.   The left ventricular ejection fraction is greater than 65% by visual estimate.   There is no mitral valve regurgitation. Mild non-obstructive disease in the Diagonal/mid LAD Moderate caliber Circumflex with no obstructive disease Large dominant RCA with no obstructive disease Hyperdynamic LV systolic function No evidence of dilated aortic root or ascending aorta dissection Recommendations: Will monitor overnight. Consider other causes of neck pain, chest burning and abnormal EKG.   ECHOCARDIOGRAM COMPLETE  Result Date: 03/09/2022    ECHOCARDIOGRAM REPORT   Patient Name:   SAARA KIJOWSKI Date of Exam: 03/09/2022 Medical Rec #:  [id number]        Height:       65.0 in Accession #:    [id number]       Weight:       164.2 lb Date of Birth:  02-02-1956         BSA:          1.819 m Patient Age:    65 years         BP:           147/71 mmHg Patient Gender: F                HR:           84 bpm. Exam Location:  Inpatient Procedure: 2D Echo and Strain Analysis Indications:    chest pain  History:        Patient has no prior history of Echocardiogram examinations.                 CAD, COPD; Risk Factors:Current Smoker.  Sonographer:    Delcie Roch RDCS Referring Phys: [id number] CHRISTOPHER D MCALHANY IMPRESSIONS  1. Left ventricular ejection fraction, by estimation, is 60  to 65%. The left ventricle has normal function. The left ventricle has no regional wall motion abnormalities. Left ventricular diastolic parameters were normal. The average left ventricular global longitudinal strain is -18.0 %. The global longitudinal strain is normal.  2. Right ventricular systolic function is normal. The right ventricular size is normal. Tricuspid regurgitation signal is inadequate for assessing PA pressure.  3. The mitral valve is normal in structure. No evidence of mitral valve regurgitation. No evidence of mitral stenosis.  4. The aortic valve was not well visualized. Aortic valve regurgitation is not visualized.  5. The inferior vena cava is normal in size with greater than 50% respiratory variability, suggesting right atrial pressure of 3 mmHg. Comparison(s): No prior Echocardiogram. FINDINGS  Left Ventricle: Left ventricular ejection fraction, by estimation, is 60 to 65%. The left ventricle has normal function. The left ventricle has no regional wall motion abnormalities. The average left ventricular global longitudinal strain is -18.0 %. The global longitudinal strain is normal. The left ventricular internal cavity size was normal in size. There is no left ventricular hypertrophy. Left ventricular diastolic parameters were normal. Right Ventricle: The right ventricular size is normal. No increase in right ventricular wall  thickness. Right ventricular systolic function is normal. Tricuspid regurgitation signal is inadequate for assessing PA pressure. Left Atrium: Left atrial size was normal in size. Right Atrium: Right atrial size was normal in size. Pericardium: Trivial pericardial effusion is present. Presence of epicardial fat layer. Mitral Valve: The mitral valve is normal in structure. No evidence of mitral valve regurgitation. No evidence of mitral valve stenosis. Tricuspid Valve: The tricuspid valve is normal in structure. Tricuspid valve regurgitation is not demonstrated. No  evidence of tricuspid stenosis. Aortic Valve: The aortic valve was not well visualized. Aortic valve regurgitation is not visualized. Pulmonic Valve: The pulmonic valve was not well visualized. Pulmonic valve regurgitation is not visualized. No evidence of pulmonic stenosis. Aorta: The aortic root and ascending aorta are structurally normal, with no evidence of dilitation. Venous: The inferior vena cava is normal in size with greater than 50% respiratory variability, suggesting right atrial pressure of 3 mmHg. IAS/Shunts: No atrial level shunt detected by color flow Doppler.  LEFT VENTRICLE PLAX 2D LVIDd:         4.20 cm   Diastology LVIDs:         2.60 cm   LV e' medial:    10.30 cm/s LV PW:         0.80 cm   LV E/e' medial:  7.9 LV IVS:        0.60 cm   LV e' lateral:   17.30 cm/s LVOT diam:     1.90 cm   LV E/e' lateral: 4.7 LV SV:         57 LV SV Index:   31        2D Longitudinal Strain LVOT Area:     2.84 cm  2D Strain GLS Avg:     -18.0 %  RIGHT VENTRICLE             IVC RV S prime:     21.80 cm/s  IVC diam: 1.50 cm TAPSE (M-mode): 2.7 cm LEFT ATRIUM             Index        RIGHT ATRIUM           Index LA diam:        3.40 cm 1.87 cm/m   RA Area:     12.20 cm LA Vol (A2C):   56.0 ml 30.78 ml/m  RA Volume:   27.70 ml  15.23 ml/m LA Vol (A4C):   37.6 ml 20.67 ml/m LA Biplane Vol: 45.9 ml 25.23 ml/m  AORTIC VALVE LVOT Vmax:   111.00 cm/s LVOT Vmean:  74.400 cm/s LVOT VTI:    0.200 m  AORTA Ao Root diam: 2.90 cm Ao Asc diam:  3.10 cm MITRAL VALVE MV Area (PHT): 4.15 cm    SHUNTS MV Decel Time: 183 msec    Systemic VTI:  0.20 m MV E velocity: 81.80 cm/s  Systemic Diam: 1.90 cm MV A velocity: 79.90 cm/s MV E/A ratio:  1.02 Riley Lam MD Electronically signed by Riley Lam MD Signature Date/Time: 03/09/2022/11:24:48 AM    Final     EKG: Independently reviewed.  Sinus rhythm, ST elevation in 2 3 aVF  Assessment/Plan Principal Problem:   Throat pain in adult Active Problems:   ST  elevation - in absence of Coronary Artery Disease   Chest pain  (please populate well all problems here in Problem List. (For example, if patient is on BP meds at home and you resume or decide to  hold them, it is a problem that needs to be her. Same for CAD, COPD, HLD and so on)  Hemoptysis -Happened 1 time 3 weeks ago.  H&H appears to be stable, no hypoxia.  Less likely from the left lower lung mass.  Agreed with pulmonology that patient can be follow-up as outpatient, next visit in May.  I also request pulmonology to review recent CT scan to see whether patient will need bronchoscopy sooner than next visit in May. -Overall, I believe patient is medically stable to discharge home today.  Sore throat -CT outpatient of neck soft tissue did not show any significant acute finding.  Examination did not show rash or mass, patient has a normal swallowing and breathing. -Likely her sore throat symptoms correlated to postnasal drip and sinusitis, given she already had some improvement with Flonase, recommend she complete at least 4 weeks treatment of Flonase and follow-up with ENT as outpatient.  Nonobstructive CAD -Recommend stop smoking.  Cigarette smoker -Cessation consulted.     Emeline General MD [facility] Pager [phone]  03/09/2022, 12:52 PM

## 2022-03-09 NOTE — Care Management (Signed)
?  Transition of Care (TOC) Screening Note ? ? ?Patient Details  ?Name: Alyssa [name] ?Date of Birth: 01-Jun-1956 ? ? ?Transition of Care (TOC) CM/SW Contact:    ?Graves-Bigelow, Lamar Laundry, RN ?Phone Number: ?03/09/2022, 12:37 PM ? ? ? ?Transition of Care Department [facility]) has reviewed patient and no TOC needs have been identified at this time. We will continue to monitor patient advancement through interdisciplinary progression rounds. If new patient transition needs arise, please place a TOC consult. ? ? ?

## 2022-03-09 NOTE — Progress Notes (Signed)
Progress Note  Patient Name: Alyssa [name] Date of Encounter: 03/09/2022  [facility] Cardiologist: None new   Patient Profile     66 y.o. female with history of tobacco and marijuana use who presented with throat pain and inferolateral ST elevations on EKG but no chest pain.  Troponin is negative.  However code STEMI called. Taken to the Cath Lab with normal coronary arteries.  Echocardiogram with no signs of pericardial effusion.  Assessment & Plan    Principal Problem:   Throat pain in adult Active Problems:   ST elevation - in absence of Coronary Artery Disease   Pulmonary nodules   Tobacco use   Cough with hemoptysis   ST elevation - in absence of Coronary Artery Disease ST elevations may just be her normal baseline.  There is no evidence on echocardiogram or symptomatic evidence of pericarditis.  Coronary arteries were essentially normal.  Nothing to explain EKG findings.  No further cardiac evaluation necessary.  Throat pain in adult Group A strep negative.  Viral studies pending.   Family is concerned about possible esophageal issues.  Soft tissue CT appears to be relatively benign.  I have consulted internal medicine and PCCM to assist with evaluation of the throat pain as well as hemoptysis.   Until these issues have been fully better, I do not feel comfortable discharging despite being stable from a cardiac standpoint.  Tobacco use Discussed importance of tobacco and marijuana use cessation.  Cough with hemoptysis Concerning finding in a patient with long-term smoking history followed by Dr. Delton Coombes from pulmonary medicine.   Recent CT scan read as having an ~11 x 7 cm nodule.  Probably more consistent with a millimeters based on review.   Regardless with hemoptysis and pulmonary nodules, in discussion with internal medicine, they were concerned that the patient may need bronchoscopy.   Plan is for pulmonary medicine after medicine consultation to  determine stability for discharge versus continued inpatient evaluation.  If she does need to stay inpatient, would request transfer to internal medicine.  Subjective   No chest pain pressure or dyspnea.  Continues to note throat pain with intermittent cough.  She showed me a picture on her cell phone of a tissue full of bloody sputum that actually appeared to be frank blood.  Inpatient Medications    Scheduled Meds:  aspirin EC  81 mg Oral Daily   atorvastatin  20 mg Oral Daily   nicotine  14 mg Transdermal Daily   pregabalin  100 mg Oral TID   sodium chloride flush  3 mL Intravenous Q12H   Continuous Infusions:  sodium chloride     PRN Meds: sodium chloride, acetaminophen, ALPRAZolam, nitroGLYCERIN, ondansetron (ZOFRAN) IV, phenol, sodium chloride flush, traMADol   Vital Signs    Vitals:   03/09/22 0900 03/09/22 1000 03/09/22 1100 03/09/22 1200  BP: 135/80 139/72 136/69 (!) 141/69  Pulse: 82 85 85 93  Resp: 19 20 20 16   Temp:   99.3 F (37.4 C)   TempSrc:   Oral   SpO2: 95% 93% 93% 92%  Weight:      Height:        Intake/Output Summary (Last 24 hours) at 03/09/2022 1326 Last data filed at 03/09/2022 0815 Gross per 24 hour  Intake 670.11 ml  Output 850 ml  Net -179.89 ml   Last 3 Weights 03/09/2022 03/09/2022 03/08/2022  Weight (lbs) 164 lb 3.9 oz 164 lb 3.9 oz 166 lb  Weight (kg) 74.5 kg  74.5 kg 75.297 kg      Telemetry    Sinus rhythm- Personally Reviewed  ECG    Sinus rhythm Inferolateral ST elevations persistent, cannot exclude possible pericarditis.- Personally Reviewed  Physical Exam   GEN: No acute distress.  Just seems lethargic Neck: No JVD or bruit, but does have tenderness to palpation and shotty lymph nodes. Cardiac: RRR, no murmurs, rubs, or gallops.  Respiratory: Diffuse coarse breath sounds throughout.  Normal wall motion. GI: Soft, nontender, non-distended  MS: No edema; No deformity.  Radial cath site clean dry and intact. Neuro:  Nonfocal   Psych: Normal affect   Labs    High Sensitivity Troponin:   Recent Labs  Lab 03/08/22 2245 03/09/22 0058  TROPONINIHS 4 4     Chemistry Recent Labs  Lab 03/08/22 1810 03/09/22 0058  NA 136 133*  K 4.0 3.7  CL 100 98  CO2 26 25  GLUCOSE 126* 108*  BUN 14 14  CREATININE 0.68 0.60  CALCIUM 8.9 8.5*  GFRNONAA >60 >60  ANIONGAP 10 10    Lipids  Recent Labs  Lab 03/09/22 0058  CHOL 166  TRIG 57  HDL 58  LDLCALC 97  CHOLHDL 2.9    Hematology Recent Labs  Lab 03/08/22 1810 03/09/22 0058  WBC 8.7 6.8  RBC 4.49 4.17  HGB 13.4 12.2  HCT 40.7 37.5  MCV 90.6 89.9  MCH 29.8 29.3  MCHC 32.9 32.5  RDW 12.6 12.6  PLT 259 225   Thyroid  Recent Labs  Lab 03/09/22 0058  TSH 1.148    BNPNo results for input(s): BNP, PROBNP in the last 168 hours.  DDimer No results for input(s): DDIMER in the last 168 hours.   Radiology    DG Chest 2 View  Result Date: 03/08/2022 CLINICAL DATA:  Hemoptysis EXAM: CHEST - 2 VIEW COMPARISON:  Chest CT 02/06/2022 FINDINGS: The heart size and mediastinal contours are within normal limits. Both lungs are clear. The visualized skeletal structures are unremarkable. IMPRESSION: No active cardiopulmonary disease. Electronically Signed   By: Jasmine Pang M.D.   On: 03/08/2022 18:47   CARDIAC CATHETERIZATION  Result Date: 03/09/2022   1st Diag lesion is 30% stenosed.   Prox LAD to Mid LAD lesion is 20% stenosed.   The left ventricular systolic function is normal.   LV end diastolic pressure is normal.   The left ventricular ejection fraction is greater than 65% by visual estimate.   There is no mitral valve regurgitation. Mild non-obstructive disease in the Diagonal/mid LAD Moderate caliber Circumflex with no obstructive disease Large dominant RCA with no obstructive disease Hyperdynamic LV systolic function No evidence of dilated aortic root or ascending aorta dissection Recommendations: Will monitor overnight. Consider other causes of neck  pain, chest burning and abnormal EKG.   ECHOCARDIOGRAM COMPLETE  Result Date: 03/09/2022    ECHOCARDIOGRAM REPORT   Patient Name:   Alyssa [name] Date of Exam: 03/09/2022 Medical Rec #:  [id number]        Height:       65.0 in Accession #:    [id number]       Weight:       164.2 lb Date of Birth:  Jan 17, 1956         BSA:          1.819 m Patient Age:    65 years         BP:           147/71  mmHg Patient Gender: F                HR:           84 bpm. Exam Location:  Inpatient Procedure: 2D Echo and Strain Analysis Indications:    chest pain  History:        Patient has no prior history of Echocardiogram examinations.                 CAD, COPD; Risk Factors:Current Smoker.  Sonographer:    Delcie RochLauren Pennington RDCS Referring Phys: [id number] CHRISTOPHER D MCALHANY IMPRESSIONS  1. Left ventricular ejection fraction, by estimation, is 60 to 65%. The left ventricle has normal function. The left ventricle has no regional wall motion abnormalities. Left ventricular diastolic parameters were normal. The average left ventricular global longitudinal strain is -18.0 %. The global longitudinal strain is normal.  2. Right ventricular systolic function is normal. The right ventricular size is normal. Tricuspid regurgitation signal is inadequate for assessing PA pressure.  3. The mitral valve is normal in structure. No evidence of mitral valve regurgitation. No evidence of mitral stenosis.  4. The aortic valve was not well visualized. Aortic valve regurgitation is not visualized.  5. The inferior vena cava is normal in size with greater than 50% respiratory variability, suggesting right atrial pressure of 3 mmHg. Comparison(s): No prior Echocardiogram. FINDINGS  Left Ventricle: Left ventricular ejection fraction, by estimation, is 60 to 65%. The left ventricle has normal function. The left ventricle has no regional wall motion abnormalities. The average left ventricular global longitudinal strain is -18.0 %. The global longitudinal  strain is normal. The left ventricular internal cavity size was normal in size. There is no left ventricular hypertrophy. Left ventricular diastolic parameters were normal. Right Ventricle: The right ventricular size is normal. No increase in right ventricular wall thickness. Right ventricular systolic function is normal. Tricuspid regurgitation signal is inadequate for assessing PA pressure. Left Atrium: Left atrial size was normal in size. Right Atrium: Right atrial size was normal in size. Pericardium: Trivial pericardial effusion is present. Presence of epicardial fat layer. Mitral Valve: The mitral valve is normal in structure. No evidence of mitral valve regurgitation. No evidence of mitral valve stenosis. Tricuspid Valve: The tricuspid valve is normal in structure. Tricuspid valve regurgitation is not demonstrated. No evidence of tricuspid stenosis. Aortic Valve: The aortic valve was not well visualized. Aortic valve regurgitation is not visualized. Pulmonic Valve: The pulmonic valve was not well visualized. Pulmonic valve regurgitation is not visualized. No evidence of pulmonic stenosis. Aorta: The aortic root and ascending aorta are structurally normal, with no evidence of dilitation. Venous: The inferior vena cava is normal in size with greater than 50% respiratory variability, suggesting right atrial pressure of 3 mmHg. IAS/Shunts: No atrial level shunt detected by color flow Doppler.  LEFT VENTRICLE PLAX 2D LVIDd:         4.20 cm   Diastology LVIDs:         2.60 cm   LV e' medial:    10.30 cm/s LV PW:         0.80 cm   LV E/e' medial:  7.9 LV IVS:        0.60 cm   LV e' lateral:   17.30 cm/s LVOT diam:     1.90 cm   LV E/e' lateral: 4.7 LV SV:         57 LV SV Index:   31  2D Longitudinal Strain LVOT Area:     2.84 cm  2D Strain GLS Avg:     -18.0 %  RIGHT VENTRICLE             IVC RV S prime:     21.80 cm/s  IVC diam: 1.50 cm TAPSE (M-mode): 2.7 cm LEFT ATRIUM             Index        RIGHT  ATRIUM           Index LA diam:        3.40 cm 1.87 cm/m   RA Area:     12.20 cm LA Vol (A2C):   56.0 ml 30.78 ml/m  RA Volume:   27.70 ml  15.23 ml/m LA Vol (A4C):   37.6 ml 20.67 ml/m LA Biplane Vol: 45.9 ml 25.23 ml/m  AORTIC VALVE LVOT Vmax:   111.00 cm/s LVOT Vmean:  74.400 cm/s LVOT VTI:    0.200 m  AORTA Ao Root diam: 2.90 cm Ao Asc diam:  3.10 cm MITRAL VALVE MV Area (PHT): 4.15 cm    SHUNTS MV Decel Time: 183 msec    Systemic VTI:  0.20 m MV E velocity: 81.80 cm/s  Systemic Diam: 1.90 cm MV A velocity: 79.90 cm/s MV E/A ratio:  1.02 Riley Lam MD Electronically signed by Riley Lam MD Signature Date/Time: 03/09/2022/11:24:48 AM    Final     Cardiac Studies - Summary   Cardiac cath 2022/03/09: Ostial diagonal 30%, proximal to mid LAD 20%.  Otherwise normal coronary arteries with a large dominant RCA.  EF greater than 65%.  No MR.  Echo 03/09/2022: EF 60 to 65%.  Normal LV function.  Normal diastolic function.  Normal RV size and function.  Unable to assess PAP.  Normal aortic and mitral valve.  Normal RAP.   For questions or updates, please contact [facility] Please consult [email or web address] for contact info under        Signed, Bryan Lemma, MD  03/09/2022, 1:26 PM

## 2022-03-09 NOTE — Plan of Care (Signed)
Discharge education and instructions given to pt.   All questions answered. ?

## 2022-03-09 NOTE — Progress Notes (Signed)
?  Echocardiogram ?2D Echocardiogram has been performed. ? ?Alyssa [name] ?03/09/2022, 10:10 AM ?

## 2022-03-16 NOTE — Progress Notes (Signed)
Chart reviewed by Dr. Doroteo Glassman, anesthesiologist,  including recent ED visit and pulmonology visit. Per aforesaid, okay to proceed with surgery at [facility] on 03/22/22 as planned, unless significant change in patient health. ?

## 2022-03-16 NOTE — Anesthesia Preprocedure Evaluation (Addendum)
Anesthesia Evaluation  ?Patient identified by MRN, date of birth, ID band ?Patient awake ? ? ? ?Reviewed: ?Allergy & Precautions, NPO status , Patient's Chart, lab work & pertinent test results ? ?Airway ?Mallampati: II ? ?TM Distance: >3 FB ?Neck ROM: Full ? ? ? Dental ?no notable dental hx. ?(+) Loose, Upper Dentures, Dental Advisory Given,  ?  ?Pulmonary ?COPD, Current Smoker,  ?  ?Pulmonary exam normal ?breath sounds clear to auscultation ? ? ? ? ? ? Cardiovascular ?hypertension, Normal cardiovascular exam ?Rhythm:Regular Rate:Normal ? ?Cath 03/08/2022 ?? ?1st Diag lesion is 30% stenosed. ?? ?Prox LAD to Mid LAD lesion is 20% stenosed. ?? ?The left ventricular systolic function is normal. ?? ?LV end diastolic pressure is normal. ?? ?The left ventricular ejection fraction is greater than 65% by visual estimate. ?? ?There is no mitral valve regurgitation. ?? ?Mild non-obstructive disease in the Diagonal/mid LAD ?Moderate caliber Circumflex with no obstructive disease ?Large dominant RCA with no obstructive disease ?Hyperdynamic LV systolic function ?No evidence of dilated aortic root or ascending aorta dissection ? ?Echo 03/09/2022 ?1. Left ventricular ejection fraction, by estimation, is 60 to 65%. The  ?left ventricle has normal function. The left ventricle has no regional  ?wall motion abnormalities. Left ventricular diastolic parameters were  ?normal. The average left ventricular  ?global longitudinal strain is -18.0 %. The global longitudinal strain is  ?normal.  ??2. Right ventricular systolic function is normal. The right ventricular  ?size is normal. Tricuspid regurgitation signal is inadequate for assessing  ?PA pressure.  ??3. The mitral valve is normal in structure. No evidence of mitral valve  ?regurgitation. No evidence of mitral stenosis.  ??4. The aortic valve was not well visualized. Aortic valve regurgitation  ?is not visualized.  ??5. The inferior vena cava is  normal in size with greater than 50%  ?respiratory variability, suggesting right atrial pressure of 3 mmHg.  ?  ?Neuro/Psych ? Headaches,  Neuromuscular disease   ? GI/Hepatic ?negative GI ROS, (+)  ?  ? substance abuse ? ,   ?Endo/Other  ? ? Renal/GU ?Renal diseaseLab Results ?     Component                Value               Date                 ?     CREATININE               0.60                03/09/2022            ?     K                        3.7                 03/09/2022           ?     ? ?  ?Musculoskeletal ? ? Abdominal ?  ?Peds ? Hematology ?Lab Results ?     Component                Value               Date                 ?     WBC  6.8                 03/09/2022           ?     HGB                      12.2                03/09/2022           ?     HCT                      37.5                03/09/2022           ?      PLT                      225                 03/09/2022           ?   ?Anesthesia Other Findings ?All: see list ? Reproductive/Obstetrics ? ?  ? ? ? ? ? ? ? ? ? ? ? ? ? ?  ?  ? ? ? ? ? ? ?Anesthesia Physical ?Anesthesia Plan ? ?ASA: 3 ? ?Anesthesia Plan: General  ? ?Post-op Pain Management: Ketamine IV* and Ofirmev IV (intra-op)*  ? ?Induction: Intravenous ? ?PONV Risk Score and Plan: Treatment may vary due to age or medical condition, Ondansetron, Dexamethasone and Midazolam ? ?Airway Management Planned: LMA ? ?Additional Equipment: None ? ?Intra-op Plan:  ? ?Post-operative Plan:  ? ?Informed Consent: I have reviewed the patients History and Physical, chart, labs and discussed the procedure including the risks, benefits and alternatives for the proposed anesthesia with the patient or authorized representative who has indicated his/her understanding and acceptance.  ? ? ? ?Dental advisory given ? ?Plan Discussed with: CRNA and Anesthesiologist ? ?Anesthesia Plan Comments: (Chart reviewed PAT w/ finucane- neg cardiac w/u recently, should be fine for [facility]. Will be  following up with pulm in may, seems to be at her baseline currently, still smoking.  ?Background:  ?presented to ED 03/08/22 for worsening of throat pain and epigastric pain.  On arrival, patient vital signs were stable, nonhypoxic, blood pressure heart rate within normal limits.  Work-up showed troponin negative x1, EKG showed ST elevation on inferior leads.  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 ? ?Patient also reported about 3 weeks ago she had 1 episode of coughing up blood clots, which she is not sure whether that was from nosebleed event with she swallowed some of the nosebleed clot versus coughing up blood.  But she never covered in blood since then.  Patient has a history of left lung mass for which she has been following with pulmonology Dr. Edyth Gunnels, and she underwent a CT chest without contrast 1 month ago, which showed some enlargement of the left lower lobe mass. Pulmonology recommend repeat image study in May, but no offer of bronchoscopy/biopsy. ? ?)  ? ? ? ? ?Anesthesia Quick Evaluation ? ?

## 2022-03-19 ENCOUNTER — Telehealth: Payer: Self-pay | Admitting: Obstetrics and Gynecology

## 2022-03-19 ENCOUNTER — Other Ambulatory Visit: Payer: Self-pay

## 2022-03-19 ENCOUNTER — Encounter (HOSPITAL_BASED_OUTPATIENT_CLINIC_OR_DEPARTMENT_OTHER): Payer: Self-pay | Admitting: Obstetrics and Gynecology

## 2022-03-19 NOTE — Telephone Encounter (Signed)
Spoke with patient and answered questions about surgery.  ? ?

## 2022-03-19 NOTE — Progress Notes (Signed)
Spoke w/ via phone for pre-op interview: patient ?Lab needs dos: none ?Lab results: Echo 03/09/22; EKG 03/09/22; CBC 03/09/22; BMP 03/09/22 ?COVID test: patient states asymptomatic no test needed. ?Arrive at 11:45 03/22/22 ?NPO after MN except clear liquids.Clear liquids from MN until 10:45 ?Med rec completed. ?Medications to take morning of surgery: Tramadol and Tylenol PRN; Flonase and Lyrica; Follow surgeon instructions regarding stopping Aspirin. ?Diabetic medication: NA ?Patient instructed no nail polish to be worn day of surgery. ?Patient instructed to bring photo id and insurance card day of surgery. ?Patient aware to have Driver (ride ) / caregiver for 24 hours after surgery. (Husband to drive) ?Patient Special Instructions: NA ?Pre-Op special Istructions: NA ?Patient verbalized understanding of instructions that were given at this phone interview. ?Patient denies shortness of breath, chest pain, fever, cough at this phone interview.  ?

## 2022-03-21 NOTE — H&P (Signed)
?Alyssa [name] ?Pre-Operative H&P ? ?Subjective ?Chief Complaint: Alyssa [name] presents for a preoperative encounter.  ? ?History of Present Illness: ?Alyssa [name] is a 66 y.o. female who presents for preoperative visit.  She is scheduled to undergo Exam under anesthesia, Colpocleisis, cystoscopy, perineorrhaphy on 03/22/22.  Her symptoms include vaginal bulge and discomfort, and she was was found to have Stage IV anterior, Stage IV posterior, Stage IV apical prolapse ? ?She had 2 prior prolapse repairs without leakage- did not undergo urodynamics.  ? ?Past Medical History:  ?Diagnosis Date  ? Bright's disease age 66  ? issue resolved  ? Chronic pain   ? COPD (chronic obstructive pulmonary disease) ([place])   ? Headache   ? Marijuana abuse   ? Nerve damage   ? to back and legs  ? Primary localized osteoarthrosis, lower leg   ? oa  ? Pulmonary nodules   ? Tobacco abuse   ? Wears dentures top  ? Wears glasses   ?  ? ?Past Surgical History:  ?Procedure Laterality Date  ? ABDOMINAL HYSTERECTOMY  yrs ago  ? 1 ovary left  ? ANTERIOR AND POSTERIOR REPAIR N/A 05/30/2021  ? Procedure: ANTERIOR (CYSTOCELE) AND POSTERIOR REPAIR (RECTOCELE);  Surgeon: Cheri Fowler, MD;  Location: [facility];  Service: Gynecology;  Laterality: N/A;  ? BACK SURGERY  yrs ago  ? x3 bolts  and screws and rods x 2 lower back  ? HEMORRHOID SURGERY  yrs ago  ? LEFT HEART CATH AND CORONARY ANGIOGRAPHY N/A 03/08/2022  ? Procedure: LEFT HEART CATH AND CORONARY ANGIOGRAPHY;  Surgeon: Burnell Blanks, MD;  Location: [facility];  Service: Cardiovascular;  Laterality: N/A;  ? ROBOTIC ASSISTED LAPAROSCOPIC SACROCOLPOPEXY N/A 02/13/2022  ? Procedure: XI ROBOTIC ASSISTED LAPAROSCOPIC LYSIS OF ADHESIONS;  Surgeon: Jaquita Folds, MD;  Location: [facility];  Service: Gynecology;  Laterality: N/A;  ? VAGINAL PROLAPSE REPAIR N/A 10/19/2021  ? Procedure: VAGINAL VAULT SUSPENSION;  Surgeon:  Cheri Fowler, MD;  Location: [facility];  Service: Gynecology;  Laterality: N/A;  ? VULVAR LESION REMOVAL N/A 05/30/2021  ? Procedure: REMOVAL OF VULVAR CYST;  Surgeon: Cheri Fowler, MD;  Location: [facility];  Service: Gynecology;  Laterality: N/A;  ? ? ?is allergic to bee venom, arthrotec [diclofenac-misoprostol], codeine, cymbalta [duloxetine hcl], effexor [venlafaxine hydrochloride], escitalopram oxalate, hydrocodone, neurontin [gabapentin], oxymorphone hcl, pentazocine lactate, provigil [modafinil], and sertraline hcl.  ? ?Family History  ?Problem Relation Age of Onset  ? Stroke Mother   ? Hypertension Father   ? Breast cancer Paternal Aunt   ? ? ?Social History  ? ?Tobacco Use  ? Smoking status: Every Day  ?  Packs/day: 1.50  ?  Years: 40.00  ?  Pack years: 60.00  ?  Types: Cigarettes  ? Smokeless tobacco: Never  ? Tobacco comments:  ?  10 cigarettes smoked daily ARJ 02/20/22  ?Vaping Use  ? Vaping Use: Never used  ?Substance Use Topics  ? Alcohol use: No  ? Drug use: No  ? ? ? ?Review of Systems was negative for a full 10 system review except as noted in the History of Present Illness. ? ?No current facility-administered medications for this encounter. ? ?Current Outpatient Medications:  ?  acetaminophen (TYLENOL) 500 MG tablet, Take 1 tablet (500 mg total) by mouth every 6 (six) hours as needed (pain). (Patient taking differently: Take 500 mg by mouth every 6 (six) hours as needed for moderate  pain (pain).), Disp: 30 tablet, Rfl: 0 ?  ALPRAZolam (XANAX) 1 MG tablet, Take 1 mg by mouth at bedtime as needed for anxiety., Disp: , Rfl:  ?  ascorbic acid (VITAMIN C) 500 MG tablet, Take 500 mg by mouth daily., Disp: , Rfl:  ?  aspirin EC 81 MG tablet, Take 81 mg by mouth daily. Swallow whole., Disp: , Rfl:  ?  atorvastatin (LIPITOR) 20 MG tablet, Take 1 tablet (20 mg total) by mouth daily., Disp: 30 tablet, Rfl: 2 ?  Cholecalciferol (D3 MAXIMUM STRENGTH) 125 MCG (5000 UT)  capsule, Take 5,000 Units by mouth daily., Disp: , Rfl:  ?  fluticasone (FLONASE) 50 MCG/ACT nasal spray, Place 2 sprays into both nostrils daily., Disp: 16 g, Rfl: 2 ?  polyethylene glycol powder (GLYCOLAX/MIRALAX) 17 GM/SCOOP powder, Take 17 g by mouth daily. Drink 17g (1 scoop) dissolved in water per day. (Patient taking differently: Take 17 g by mouth daily.), Disp: 255 g, Rfl: 0 ?  pregabalin (LYRICA) 100 MG capsule, Take 100 mg by mouth 3 (three) times daily., Disp: , Rfl:  ?  traMADol (ULTRAM) 50 MG tablet, Take 100 mg by mouth in the morning, at noon, and at bedtime., Disp: , Rfl:  ?  HYDROmorphone (DILAUDID) 2 MG tablet, Take 1 tablet (2 mg total) by mouth every 4 (four) hours as needed for severe pain., Disp: 15 tablet, Rfl: 0 ?  ibuprofen (ADVIL) 600 MG tablet, Take 1 tablet (600 mg total) by mouth every 6 (six) hours as needed. (Patient taking differently: Take 600 mg by mouth every 6 (six) hours as needed for mild pain.), Disp: 30 tablet, Rfl: 0  ? ?Objective ?There were no vitals filed for this visit. ? ? ?Gen: NAD ?CV: S1 S2 RRR ?Lungs: Clear to auscultation bilaterally ?Abd: soft, nontender ? ?Pessary fitting: A 2-1/2 in gellhorn pessary was placed. It was comfortable, fit well, and stayed in placed with strong cough, valsalva and bending. One finger was able to fit between the pessary and the vaginal walls.  ? ?Previous Pelvic Exam showed: ?POP-Q:  ?  ?POP-Q ?  ?3  ?                                          Aa   ?4.5 ?                                          Ba   ?5  ?                                            C  ?  ?5  ?                                          Gh   ?2.5  ?                                          Pb   ?5.5  ?  tvl  ?  ?3  ?                                          Ap   ?4.5  ?                                          Bp   ?   ?                                            D  ?  ?  ? ? ? ?Assessment/ Plan ? ?Assessment: ?The patient is a 66  y.o. year old scheduled to undergo Exam under anesthesia, Colpocleisis, cystoscopy, perineorrhaphy.  ? ?Jaquita Folds, MD ? ? ? ? ? ?

## 2022-03-22 ENCOUNTER — Ambulatory Visit (HOSPITAL_BASED_OUTPATIENT_CLINIC_OR_DEPARTMENT_OTHER): Payer: Medicare Other | Admitting: Anesthesiology

## 2022-03-22 ENCOUNTER — Ambulatory Visit (HOSPITAL_BASED_OUTPATIENT_CLINIC_OR_DEPARTMENT_OTHER)
Admission: RE | Admit: 2022-03-22 | Discharge: 2022-03-22 | Disposition: A | Discharge status: Home / Self Care | Payer: Medicare Other | Attending: Obstetrics and Gynecology | Admitting: Obstetrics and Gynecology

## 2022-03-22 ENCOUNTER — Other Ambulatory Visit: Payer: Self-pay

## 2022-03-22 ENCOUNTER — Encounter (HOSPITAL_BASED_OUTPATIENT_CLINIC_OR_DEPARTMENT_OTHER)
Admission: RE | Disposition: A | Discharge status: Home / Self Care | Payer: Self-pay | Source: Home / Self Care | Attending: Obstetrics and Gynecology

## 2022-03-22 ENCOUNTER — Encounter (HOSPITAL_BASED_OUTPATIENT_CLINIC_OR_DEPARTMENT_OTHER): Payer: Self-pay | Admitting: Obstetrics and Gynecology

## 2022-03-22 DIAGNOSIS — N289 Disorder of kidney and ureter, unspecified: Secondary | ICD-10-CM

## 2022-03-22 DIAGNOSIS — I10 Essential (primary) hypertension: Secondary | ICD-10-CM

## 2022-03-22 DIAGNOSIS — J449 Chronic obstructive pulmonary disease, unspecified: Secondary | ICD-10-CM | POA: Diagnosis not present

## 2022-03-22 DIAGNOSIS — N8111 Cystocele, midline: Secondary | ICD-10-CM | POA: Diagnosis not present

## 2022-03-22 DIAGNOSIS — N993 Prolapse of vaginal vault after hysterectomy: Secondary | ICD-10-CM | POA: Insufficient documentation

## 2022-03-22 DIAGNOSIS — F17210 Nicotine dependence, cigarettes, uncomplicated: Secondary | ICD-10-CM | POA: Insufficient documentation

## 2022-03-22 DIAGNOSIS — N816 Rectocele: Secondary | ICD-10-CM

## 2022-03-22 DIAGNOSIS — N8110 Cystocele, unspecified: Secondary | ICD-10-CM | POA: Diagnosis present

## 2022-03-22 HISTORY — PX: COLPOCLEISIS: SHX6814

## 2022-03-22 HISTORY — PX: CYSTOSCOPY: SHX5120

## 2022-03-22 HISTORY — DX: Other nonspecific abnormal finding of lung field: R91.8

## 2022-03-22 HISTORY — DX: Chronic obstructive pulmonary disease, unspecified: J44.9

## 2022-03-22 HISTORY — DX: Other injury of unspecified body region, initial encounter: T14.8XXA

## 2022-03-22 SURGERY — COLPOCLEISIS
Anesthesia: General | Site: Vagina

## 2022-03-22 MED ORDER — FENTANYL CITRATE (PF) 100 MCG/2ML IJ SOLN
INTRAMUSCULAR | Status: AC
  Filled 2022-03-22: qty 2

## 2022-03-22 MED ORDER — FENTANYL CITRATE (PF) 100 MCG/2ML IJ SOLN
INTRAMUSCULAR | Status: AC
Start: 2022-03-22 — End: ?
  Filled 2022-03-22: qty 2

## 2022-03-22 MED ORDER — WHITE PETROLATUM EX OINT
TOPICAL_OINTMENT | CUTANEOUS | Status: AC
  Filled 2022-03-22: qty 5

## 2022-03-22 MED ORDER — PHENAZOPYRIDINE HCL 100 MG PO TABS
ORAL_TABLET | ORAL | Status: AC
  Filled 2022-03-22: qty 2

## 2022-03-22 MED ORDER — HYDROMORPHONE HCL 1 MG/ML IJ SOLN: 0.2500 mg | INTRAMUSCULAR | Status: DC | PRN

## 2022-03-22 MED ORDER — SODIUM CHLORIDE 0.9 % IR SOLN
Status: DC | PRN
  Administered 2022-03-22: 200 mL

## 2022-03-22 MED ORDER — OXYCODONE HCL 5 MG/5ML PO SOLN: 5.0000 mg | Freq: Once | ORAL | Status: DC | PRN

## 2022-03-22 MED ORDER — CEFAZOLIN SODIUM-DEXTROSE 2-4 GM/100ML-% IV SOLN
INTRAVENOUS | Status: AC
  Filled 2022-03-22: qty 100

## 2022-03-22 MED ORDER — ONDANSETRON HCL 4 MG/2ML IJ SOLN
INTRAMUSCULAR | Status: DC | PRN
  Administered 2022-03-22: 4 mg via INTRAVENOUS

## 2022-03-22 MED ORDER — DEXMEDETOMIDINE (PRECEDEX) IN NS 20 MCG/5ML (4 MCG/ML) IV SYRINGE
PREFILLED_SYRINGE | INTRAVENOUS | Status: DC | PRN
  Administered 2022-03-22: 4 ug via INTRAVENOUS

## 2022-03-22 MED ORDER — EPHEDRINE 5 MG/ML INJ
INTRAVENOUS | Status: AC
  Filled 2022-03-22: qty 5

## 2022-03-22 MED ORDER — MIDAZOLAM HCL 5 MG/5ML IJ SOLN
INTRAMUSCULAR | Status: DC | PRN
  Administered 2022-03-22: 2 mg via INTRAVENOUS

## 2022-03-22 MED ORDER — CEFAZOLIN SODIUM-DEXTROSE 2-4 GM/100ML-% IV SOLN
2.0000 g | INTRAVENOUS | Status: AC
  Administered 2022-03-22: 2 g via INTRAVENOUS

## 2022-03-22 MED ORDER — LIDOCAINE-EPINEPHRINE 1 %-1:100000 IJ SOLN
INTRAMUSCULAR | Status: DC | PRN
  Administered 2022-03-22: 20 mL

## 2022-03-22 MED ORDER — MIDAZOLAM HCL 2 MG/2ML IJ SOLN
INTRAMUSCULAR | Status: AC
  Filled 2022-03-22: qty 2

## 2022-03-22 MED ORDER — DEXAMETHASONE SODIUM PHOSPHATE 10 MG/ML IJ SOLN
INTRAMUSCULAR | Status: DC | PRN
  Administered 2022-03-22: 10 mg via INTRAVENOUS

## 2022-03-22 MED ORDER — KETOROLAC TROMETHAMINE 30 MG/ML IJ SOLN
INTRAMUSCULAR | Status: DC | PRN
  Administered 2022-03-22: 30 mg via INTRAVENOUS

## 2022-03-22 MED ORDER — ONDANSETRON HCL 4 MG/2ML IJ SOLN: 4.0000 mg | Freq: Once | INTRAMUSCULAR | Status: DC | PRN

## 2022-03-22 MED ORDER — PROPOFOL 10 MG/ML IV BOLUS
INTRAVENOUS | Status: AC
Start: 2022-03-22 — End: ?
  Filled 2022-03-22: qty 20

## 2022-03-22 MED ORDER — OXYCODONE HCL 5 MG PO TABS: 5.0000 mg | ORAL_TABLET | Freq: Once | ORAL | Status: DC | PRN

## 2022-03-22 MED ORDER — PROPOFOL 10 MG/ML IV BOLUS
INTRAVENOUS | Status: DC | PRN
  Administered 2022-03-22: 110 mg via INTRAVENOUS

## 2022-03-22 MED ORDER — LIDOCAINE 2% (20 MG/ML) 5 ML SYRINGE
INTRAMUSCULAR | Status: DC | PRN
  Administered 2022-03-22: 100 mg via INTRAVENOUS

## 2022-03-22 MED ORDER — DEXAMETHASONE SODIUM PHOSPHATE 10 MG/ML IJ SOLN
INTRAMUSCULAR | Status: AC
  Filled 2022-03-22: qty 1

## 2022-03-22 MED ORDER — FENTANYL CITRATE (PF) 100 MCG/2ML IJ SOLN
INTRAMUSCULAR | Status: DC | PRN
  Administered 2022-03-22 (×3): 50 ug via INTRAVENOUS

## 2022-03-22 MED ORDER — LACTATED RINGERS IV SOLN: INTRAVENOUS | Status: DC

## 2022-03-22 MED ORDER — ONDANSETRON HCL 4 MG/2ML IJ SOLN
INTRAMUSCULAR | Status: AC
  Filled 2022-03-22: qty 2

## 2022-03-22 MED ORDER — EPHEDRINE SULFATE-NACL 50-0.9 MG/10ML-% IV SOSY
PREFILLED_SYRINGE | INTRAVENOUS | Status: DC | PRN
  Administered 2022-03-22: 5 mg via INTRAVENOUS

## 2022-03-22 MED ORDER — 0.9 % SODIUM CHLORIDE (POUR BTL) OPTIME
TOPICAL | Status: DC | PRN
  Administered 2022-03-22: 500 mL

## 2022-03-22 MED ORDER — PHENAZOPYRIDINE HCL 100 MG PO TABS
200.0000 mg | ORAL_TABLET | ORAL | Status: AC
  Administered 2022-03-22: 200 mg via ORAL

## 2022-03-22 SURGICAL SUPPLY — 36 items
AGENT HMST KT MTR STRL THRMB (HEMOSTASIS)
APL PRP STRL LF DISP 70% ISPRP (MISCELLANEOUS)
BLADE SURG 15 STRL LF DISP TIS (BLADE) ×2 IMPLANT
BLADE SURG 15 STRL SS (BLADE) ×3
CHLORAPREP W/TINT 26 (MISCELLANEOUS) IMPLANT
DECANTER SPIKE VIAL GLASS SM (MISCELLANEOUS) IMPLANT
DEVICE CAPIO SLIM SINGLE (INSTRUMENTS) IMPLANT
ELECT REM PT RETURN 9FT ADLT (ELECTROSURGICAL) ×3
ELECTRODE REM PT RTRN 9FT ADLT (ELECTROSURGICAL) IMPLANT
GAUZE 4X4 16PLY ~~LOC~~+RFID DBL (SPONGE) ×3 IMPLANT
GLOVE SURG ENC MOIS LTX SZ6 (GLOVE) ×3 IMPLANT
GLOVE SURG UNDER POLY LF SZ6.5 (GLOVE) ×3 IMPLANT
GOWN STRL REUS W/TWL LRG LVL3 (GOWN DISPOSABLE) ×3 IMPLANT
HIBICLENS CHG 4% 4OZ BTL (MISCELLANEOUS) ×3 IMPLANT
HOLDER FOLEY CATH W/STRAP (MISCELLANEOUS) ×1 IMPLANT
IV NS 1000ML (IV SOLUTION) ×3
IV NS 1000ML BAXH (IV SOLUTION) IMPLANT
KIT TURNOVER CYSTO (KITS) ×3 IMPLANT
MANIFOLD NEPTUNE II (INSTRUMENTS) ×3 IMPLANT
NEEDLE HYPO 22GX1.5 SAFETY (NEEDLE) ×3 IMPLANT
NS IRRIG 1000ML POUR BTL (IV SOLUTION) ×2 IMPLANT
NS IRRIG 500ML POUR BTL (IV SOLUTION) ×1 IMPLANT
PACK CYSTO (CUSTOM PROCEDURE TRAY) ×3 IMPLANT
PACK VAGINAL WOMENS (CUSTOM PROCEDURE TRAY) ×3 IMPLANT
RETRACTOR LONE STAR DISPOSABLE (INSTRUMENTS) ×1 IMPLANT
RETRACTOR STAY HOOK 5MM (MISCELLANEOUS) ×1 IMPLANT
SET IRRIG Y TYPE TUR BLADDER L (SET/KITS/TRAYS/PACK) ×1 IMPLANT
SURGIFLO W/THROMBIN 8M KIT (HEMOSTASIS) IMPLANT
SUT MON AB 2-0 SH 27 (SUTURE) IMPLANT
SUT VIC AB 2-0 SH 27 (SUTURE) ×3
SUT VIC AB 2-0 SH 27XBRD (SUTURE) IMPLANT
SUT VIC AB 3-0 SH 18 (SUTURE) IMPLANT
SUT VICRYL 2-0 SH 8X27 (SUTURE) ×1 IMPLANT
SYR BULB EAR ULCER 3OZ GRN STR (SYRINGE) ×3 IMPLANT
TOWEL OR 17X26 10 PK STRL BLUE (TOWEL DISPOSABLE) ×3 IMPLANT
TRAY FOLEY W/BAG SLVR 14FR LF (SET/KITS/TRAYS/PACK) ×1 IMPLANT

## 2022-03-22 NOTE — Discharge Instructions (Signed)

## 2022-03-22 NOTE — OR Nursing (Signed)
Pessary removed by Dr. Florian Buff in [facility] OR #3. ?

## 2022-03-22 NOTE — Op Note (Signed)
Operative Note ? ?Preoperative Diagnosis: anterior vaginal prolapse, posterior vaginal prolapse, and vaginal vault prolapse after hysterectomy ? ?Postoperative Diagnosis: same ? ?Procedures performed:  ?Colpocleisis, perineorrhaphy, cystoscopy ? ?Implants: none ? ?Attending Surgeon: Sherlene Shams, MD ? ?Anesthesia: General LMA ? ?Findings: 1. On vaginal exam, stage IV vaginal prolapse noted.   ? 2. On cystoscopy, normal bladder and urethra without injury or lesion. Brisk bilateral ureteral efflux noted.  ? ?Specimens: * No specimens in log * ? ?Estimated blood loss: 25 mL ? ?IV fluids: 600 mL ? ?Urine output: 100 mL ? ?Complications: none ? 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 Sacrocolpopexy was previously attempted but unable to be completed due to dense bowel adhesions. We discussed risks, benefits and alternatives and with shared decision making decided on colpocleisis. We discussed that penetrative vaginal intercourse will likely not be possible after the surgery. The patient consented to proceed.  ? ?Procedure in Detail: ?After informed consent was obtained, the patient was taken to the operating room where she was placed under anesthesia.  She was then placed in the dorsal lithotomy position, taking care to avoid traction on the extremities. The pessary was removed from the vagina.  She was then prepped and draped in the usual sterile fashion.  A self-retaining lonestar retractor was placed using four elastic blue stays.  After a foley catheter was inserted into the urethra, the location of the midurethra was palpated. Two Allis clamps were placed at the vaginal apex. 1% lidocaine with epinephrine was injected into the vaginal mucosa circumferentially.  A marking pen was used to delineate 4 quadrants of vaginal mucosa to remove.  An incision was made with a 15 blade scalpel along these marked lines. After an Allis clamp was placed on  the vaginal mucosa, the underlying vaginal muscularis was dissected off the mucosa.  The vaginal mucosa was excised off each quadrant. A colpotomy was noted at the apex and closed with a 0- vicryl suture.  Excellent hemostasis was obtained with cautery.  Serial pursestring sutures were used to reduce the vaginal vault prolapse using 0- vicryl. Anterior and posterior plication sutures were placed to reapproximate the epithelium. The mucosa was then closed with a 0-vicryl suture in a running fashion.  ? ?Attention was then turned to the perineum.  Two Allis clamps were placed at the level of the hymenal ring and 1% lidocaine with epinephrine was injected into the vaginal mucosa. A diamond shaped incision was made over the perineum with a 15 blade scalpel, and the epithelium was removed.  The underlying tissue was then dissected off of the epithelium bilaterally with Metzenbaum scissors.  A 0-Vicryl suture was used to reapproximate the perineal body with three stacked plicating sutures. A 2-0 vicryl suture was then used to close the epithelium in a subcutaneous and subcuticular fashion.  The vagina was copiously irrigated.  Hemostasis was noted.  ? ?The Foley catheter was removed.  A 70-degree cystoscope was introduced, and 360-degree inspection revealed no trauma or lesions in the bladder, with bilateral ureteral efflux.  The bladder was drained and the cystoscope was removed.  The Foley catheter was reinserted. The patient tolerated the procedure well.  She was awakened from anesthesia and transferred to the recovery room in stable condition. All counts were correct x 2.   ? ?Jaquita Folds, MD ? ? ?

## 2022-03-22 NOTE — Anesthesia Postprocedure Evaluation (Signed)
Anesthesia Post Note ? ?Patient: Alyssa [name] ? ?Procedure(s) Performed: EXAM UNDER ANESTHESIA, COLPOCLEISIS; PERINEORRHAPHY (Vagina ) ?CYSTOSCOPY (Bladder) ? ?  ? ?Patient location during evaluation: PACU ?Anesthesia Type: General ?Level of consciousness: awake and alert, oriented and patient cooperative ?Pain management: pain level controlled ?Vital Signs Assessment: post-procedure vital signs reviewed and stable ?Respiratory status: spontaneous breathing, nonlabored ventilation and respiratory function stable ?Cardiovascular status: blood pressure returned to baseline and stable ?Postop Assessment: no apparent nausea or vomiting ?Anesthetic complications: no ? ? ?No notable events documented. ? ?Last Vitals:  ?Vitals:  ? 03/22/22 1545 03/22/22 1600  ?BP: (!) 142/77 (!) 164/76  ?Pulse: 73 70  ?Resp: 12 15  ?Temp: 36.5 ?C   ?SpO2: 97% 97%  ?  ?Last Pain:  ?Vitals:  ? 03/22/22 1545  ?TempSrc:   ?PainSc: 0-No pain  ? ? ?  ?  ?  ?  ?  ?  ? ?[name] ? ? ? ? ?

## 2022-03-22 NOTE — Anesthesia Procedure Notes (Signed)
Procedure Name: LMA Insertion ?Date/Time: 03/22/2022 2:18 PM ?Performed by: Rogers Blocker, CRNA ?Pre-anesthesia Checklist: Patient identified, Emergency Drugs available, Suction available and Patient being monitored ?Patient Re-evaluated:Patient Re-evaluated prior to induction ?Oxygen Delivery Method: Circle System Utilized ?Preoxygenation: Pre-oxygenation with 100% oxygen ?Induction Type: IV induction ?Ventilation: Mask ventilation without difficulty ?LMA: LMA inserted ?LMA Size: 4.0 ?Number of attempts: 1 ?Airway Equipment and Method: Bite block ?Placement Confirmation: positive ETCO2 ?Tube secured with: Tape ?Dental Injury: Teeth and Oropharynx as per pre-operative assessment  ? ? ? ? ?

## 2022-03-22 NOTE — Interval H&P Note (Signed)
History and Physical Interval Note: ? ?03/22/2022 ?1:54 PM ? ?Alyssa [name]  has presented today for surgery, with the diagnosis of prolapse of vaginal vault after hysterectomy, anterior prolapse; posterior prolapse.  The various methods of treatment have been discussed with the patient and family. After consideration of risks, benefits and other options for treatment, the patient has consented to  Procedure(s): ?COLPOCLEISIS (N/A) ?CYSTOSCOPY (N/A) as a surgical intervention.  The patient's history has been reviewed, patient examined, no change in status, stable for surgery.  I have reviewed the patient's chart and labs.  Questions were answered to the patient's satisfaction.   ? ? ?Marguerita Beards ? ? ?

## 2022-03-22 NOTE — Transfer of Care (Signed)
Immediate Anesthesia Transfer of Care Note ? ?Patient: Alyssa [name] ? ?Procedure(s) Performed: EXAM UNDER ANESTHESIA, COLPOCLEISIS; PERINEORRHAPHY (Vagina ) ?CYSTOSCOPY (Bladder) ? ?Patient Location: PACU ? ?Anesthesia Type:General ? ?Level of Consciousness: awake and patient cooperative ? ?Airway & Oxygen Therapy: Patient Spontanous Breathing and Patient connected to nasal cannula oxygen ? ?Post-op Assessment: Report given to RN and Post -op Vital signs reviewed and stable ? ?Post vital signs: Reviewed and stable ? ?Last Vitals:  ?Vitals Value Taken Time  ?BP 142/77 03/22/22 1546  ?Temp    ?Pulse 74 03/22/22 1547  ?Resp 9 03/22/22 1547  ?SpO2 98 % 03/22/22 1547  ?Vitals shown include unvalidated device data. ? ?Last Pain:  ?Vitals:  ? 03/22/22 1214  ?TempSrc: Oral  ?PainSc: 0-No pain  ?   ? ?Patients Stated Pain Goal: 5 (03/22/22 1214) ? ?Complications: No notable events documented. ?

## 2022-03-23 ENCOUNTER — Encounter (HOSPITAL_BASED_OUTPATIENT_CLINIC_OR_DEPARTMENT_OTHER): Payer: Self-pay | Admitting: Obstetrics and Gynecology

## 2022-03-23 ENCOUNTER — Telehealth: Payer: Self-pay | Admitting: Obstetrics and Gynecology

## 2022-03-23 NOTE — Telephone Encounter (Signed)
Alyssa [name] underwent Colpocleisis, perineorrhaphy, cystoscopy on 03/23/22.  ? ?She passed her voiding trial.  ?394ml was backfilled into the bladder ?Voided 233ml  ?PVR by bladder scan was 45ml.  ? ?She was discharged without a catheter. Please call her for a routine post op check. Thanks! ? ?Jaquita Folds, MD ? ?

## 2022-03-27 ENCOUNTER — Encounter: Payer: Medicare Other | Admitting: Obstetrics and Gynecology

## 2022-03-30 NOTE — Telephone Encounter (Signed)
Post- Op Call ? ?Alyssa [name] underwent Colpocleisis, perineorrhaphy, cystoscopy on 03/23/2022 with Dr Florian Buff. The patient reports that her pain is controlled. She is taking dilaudid. She denies vaginal bleeding. She has had a bowel movement and is taking Miralax for a bowel regimen. She was discharged without a catheter. ? ?Salina April, CMA ? ?

## 2022-04-02 ENCOUNTER — Encounter: Payer: Self-pay | Admitting: *Deleted

## 2022-04-03 ENCOUNTER — Encounter: Payer: Medicare Other | Admitting: Obstetrics and Gynecology

## 2022-04-10 ENCOUNTER — Telehealth: Payer: Self-pay

## 2022-04-10 NOTE — Telephone Encounter (Signed)
Sanjuana Letters call in to speak to Dr.Schroeder not the nurse. I explained DR. Florian Buff was not in office today and I would be glad to assist. ? ?Alyssa [name] is a 66 y.o. female daughter called in because the patient saw a suture come out the vagina and didn't know if that was normal.  ?I explained to the patient yes it was normal. I explained the sutures will be here for a few months and she will see them as she heals. ?I spoke to the patient and she said she was having some bleeding. Pt said it took up almost 1/2 a pad but has stopped and she has been trying to do yard work. I told the patient she should not do yard work. Pt should stop the yard work, still no lifting and pulling until she has her post op appt. Pt had no additional questions ? ?  ? ? ?

## 2022-05-02 ENCOUNTER — Encounter: Payer: Self-pay | Admitting: Obstetrics and Gynecology

## 2022-05-02 ENCOUNTER — Ambulatory Visit (INDEPENDENT_AMBULATORY_CARE_PROVIDER_SITE_OTHER): Payer: Medicare Other | Admitting: Obstetrics and Gynecology

## 2022-05-02 VITALS — BP 146/94 | HR 79

## 2022-05-02 DIAGNOSIS — Z9889 Other specified postprocedural states: Secondary | ICD-10-CM

## 2022-05-02 DIAGNOSIS — N952 Postmenopausal atrophic vaginitis: Secondary | ICD-10-CM

## 2022-05-02 DIAGNOSIS — N8110 Cystocele, unspecified: Secondary | ICD-10-CM

## 2022-05-02 MED ORDER — ESTRADIOL 0.1 MG/GM VA CREA: 0.5000 g | TOPICAL_CREAM | VAGINAL | 11 refills | Status: DC

## 2022-05-02 NOTE — Patient Instructions (Signed)
Start vaginal estrogen therapy nightly for two weeks then 2 times weekly at night for treatment of vaginal atrophy (dryness of the vaginal tissues).  Please let us know if the prescription is too expensive and we can look for alternative options.   

## 2022-05-02 NOTE — Progress Notes (Signed)
[facility] ? ?Date of Visit: 05/02/2022 ? ?History of Present Illness: [name] is a 66 y.o. female scheduled today for a post-operative visit.  ?? Surgery: s/p Colpocleisis, perineorrhaphy, cystoscopy on 03/22/22 ?? She passed her postoperative void trial.  ?? Postoperative course has been uncomplicated.  ? ?Today she reports she has a small amount of vaginal bleeding off and on. Complains of vaginal dryness as well.  ? ?UTI in the last 6 weeks? No  ?Pain?  Sometimes has pain but has not needed pain medication ?Vaginal bulge? No  ?Stress incontinence: No  ?Urgency/frequency: No  ?Urge incontinence: No  ?Voiding dysfunction: No  ?Bowel issues: No  ? ?Subjective Success: Do you usually have a bulge or something falling out that you can see or feel in the vaginal area? No  ?Retreatment Success: Any retreatment with surgery or pessary for any compartment? No  ? ? ?Medications: She has a current medication list which includes the following prescription(s): acetaminophen, alprazolam, ascorbic acid, aspirin ec, atorvastatin, d3 maximum strength, [START ON 05/03/2022] estradiol, fluticasone, hydromorphone, ibuprofen, polyethylene glycol powder, pregabalin, and tramadol.  ? ?Allergies: Patient is allergic to bee venom, arthrotec [diclofenac-misoprostol], codeine, cymbalta [duloxetine hcl], effexor [venlafaxine hydrochloride], escitalopram oxalate, hydrocodone, neurontin [gabapentin], oxymorphone hcl, pentazocine lactate, provigil [modafinil], and sertraline hcl.  ? ?Physical Exam: ?BP (!) 146/94   Pulse 79   ? ?Pelvic Examination: Vagina: Incisions healing well. Sutures are not present at incision line and there is not granulation tissue. No tenderness along the anterior or posterior vagina. No apical tenderness. No pelvic masses.  ? ?POP-Q: ?POP-Q ? ?-1.5  ?                                          Aa   ?-1.5 ?                                          Ba  ?-4.5  ?                                            C   ? ?2.5  ?                                          Gh  ?5  ?                                          Pb  ?4.5  ?                                          tvl  ? ?-3  ?                                          Ap  ?-  3  ?                                          Bp  ?   ?                                            D  ? ? ? ?--------------------------------------------------------- ? ?Assessment and Plan:  ?1. Prolapse of anterior vaginal wall   ?2. Vaginal atrophy   ?3. Post-operative state   ? ? ?- Has some mild anterior prolapse with straining. We discussed avoidance of heavy lifting and straining to prevent recurrence. Also recommended pelvic PT since she is active and she can benefit from pelvic floor strengthening to prevent prolapse.  ?- Can resume regular activity including exercise. She is interested in intercourse and has bought the oh nut device for her partner since she has shortened vaginal length and will not be able to achieve full penetration.  ?- For vaginal atrophy, prescribed estrace cream 0.5g to use nightly for two weeks then twice a week after.  ? ?Return as needed ? ?Marguerita Beards, MD ? ? ?

## 2022-05-04 ENCOUNTER — Other Ambulatory Visit: Payer: Self-pay | Admitting: Orthopaedic Surgery

## 2022-05-04 DIAGNOSIS — M545 Low back pain, unspecified: Secondary | ICD-10-CM

## 2022-05-15 ENCOUNTER — Ambulatory Visit
Admission: RE | Admit: 2022-05-15 | Discharge: 2022-05-15 | Disposition: A | Discharge status: Home / Self Care | Payer: Medicare Other | Source: Ambulatory Visit | Attending: Orthopaedic Surgery | Admitting: Orthopaedic Surgery

## 2022-05-15 DIAGNOSIS — M545 Low back pain, unspecified: Secondary | ICD-10-CM

## 2022-05-22 ENCOUNTER — Ambulatory Visit: Payer: Medicare Other | Admitting: Emergency Medicine

## 2022-05-29 ENCOUNTER — Other Ambulatory Visit: Payer: Self-pay | Admitting: Emergency Medicine

## 2022-06-05 ENCOUNTER — Other Ambulatory Visit: Payer: Self-pay | Admitting: Physician Assistant

## 2022-06-20 ENCOUNTER — Ambulatory Visit
Admission: RE | Admit: 2022-06-20 | Discharge: 2022-06-20 | Disposition: A | Discharge status: Home / Self Care | Payer: Medicare Other | Source: Ambulatory Visit | Attending: Emergency Medicine | Admitting: Emergency Medicine

## 2022-06-20 DIAGNOSIS — R918 Other nonspecific abnormal finding of lung field: Secondary | ICD-10-CM

## 2022-06-26 ENCOUNTER — Encounter: Payer: Self-pay | Admitting: Emergency Medicine

## 2022-06-26 ENCOUNTER — Ambulatory Visit: Payer: Medicare Other | Admitting: Emergency Medicine

## 2022-06-26 DIAGNOSIS — J449 Chronic obstructive pulmonary disease, unspecified: Secondary | ICD-10-CM | POA: Diagnosis not present

## 2022-06-26 DIAGNOSIS — Z720 Tobacco use: Secondary | ICD-10-CM

## 2022-06-26 MED ORDER — ALBUTEROL SULFATE HFA 108 (90 BASE) MCG/ACT IN AERS: 2.0000 | INHALATION_SPRAY | Freq: Four times a day (QID) | RESPIRATORY_TRACT | 6 refills | Status: DC | PRN

## 2022-06-26 NOTE — Assessment & Plan Note (Signed)
She has decreased to 15 cigarettes daily as we had discussed.  She is willing to try to decrease further and we set a goal for 10 cigarettes daily.  We can talk about her progress next time.

## 2022-06-26 NOTE — Assessment & Plan Note (Signed)
Most concerning left lower lobe mixed density nodule is resolved.  She does still have some stable groundglass left lower lobe nodules that will need to be followed.  Next scan in June 2024.

## 2022-08-31 ENCOUNTER — Other Ambulatory Visit: Payer: Self-pay | Admitting: Neurosurgery

## 2022-09-06 NOTE — Pre-Procedure Instructions (Signed)
Surgical Instructions    Your procedure is scheduled on Monday September 11.  Report to [facility] Main Entrance "A" at 11:15 A.M., then check in with the Admitting office.  Call this number if you have problems the morning of surgery:  [phone]   If you have any questions prior to your surgery date call [phone]: Open Monday-Friday 8am-4pm    Remember:  Do not eat or drink after midnight the night before your surgery    Take these medicines the morning of surgery with A SIP OF WATER:  ALPRAZolam (XANAX) pregabalin (LYRICA)  traMADol (ULTRAM)  IF NEEDED: acetaminophen (TYLENOL)  albuterol (VENTOLIN HFA)  HYDROcodone-acetaminophen (NORCO/VICODIN)  oxyCODONE-acetaminophen (PERCOCET/ROXICET)   Follow your surgeon's instructions on when to stop Asprin.  If no instructions were given by your surgeon then you will need to call the office to get those instructions.    As of today, STOP taking any Aleve, Naproxen, Ibuprofen, Motrin, Advil, Goody's, BC's, all herbal medications, fish oil, and all vitamins.           Do not wear jewelry or makeup. Do not wear lotions, powders, perfumes or deodorant. Do not shave 48 hours prior to surgery. Do not bring valuables to the hospital. [facility] is not responsible for any belongings or valuables.   Do not wear nail polish, gel polish, artificial nails, or any other type of covering on natural nails (fingers and toes) If you have artificial nails or gel coating that need to be removed by a nail salon, please have this removed prior to surgery. Artificial nails or gel coating may interfere with anesthesia's ability to adequately monitor your vital signs.    Do NOT Smoke (Tobacco/Vaping)  24 hours prior to your procedure  If you use a CPAP at night, you may bring your mask for your overnight stay.   Contacts, glasses, hearing aids, dentures or partials may not be worn into surgery, please bring cases for these belongings   For  patients admitted to the hospital, discharge time will be determined by your treatment team.   Patients discharged the day of surgery will not be allowed to drive home, and someone needs to stay with them for 24 hours.   SURGICAL WAITING ROOM VISITATION Patients having surgery or a procedure may have no more than 2 support people in the waiting area - these visitors may rotate.   Children under the age of 54 must have an adult with them who is not the patient. If the patient needs to stay at the hospital during part of their recovery, the visitor guidelines for inpatient rooms apply. Pre-op nurse will coordinate an appropriate time for 1 support person to accompany patient in pre-op.  This support person may not rotate.   Please refer to the [facility] website for the visitor guidelines for Inpatients (after your surgery is over and you are in a regular room).    Special instructions:    Oral Hygiene is also important to reduce your risk of infection.  Remember - BRUSH YOUR TEETH THE MORNING OF SURGERY WITH YOUR REGULAR TOOTHPASTE   [facility]- Preparing For Surgery  Before surgery, you can play an important role. Because skin is not sterile, your skin needs to be as free of germs as possible. You can reduce the number of germs on your skin by washing with CHG (chlorahexidine gluconate) Soap before surgery.  CHG is an antiseptic cleaner which kills germs and bonds with the skin to continue killing germs  even after washing.     Please do not use if you have an allergy to CHG or antibacterial soaps. If your skin becomes reddened/irritated stop using the CHG.  Do not shave (including legs and underarms) for at least 48 hours prior to first CHG shower. It is OK to shave your face.  Please follow these instructions carefully.     Shower the NIGHT BEFORE SURGERY and the MORNING OF SURGERY with CHG Soap.   If you chose to wash your hair, wash your hair first as usual with your normal  shampoo. After you shampoo, rinse your hair and body thoroughly to remove the shampoo.  Then [employer] and genitals (private parts) with your normal soap and rinse thoroughly to remove soap.  After that Use CHG Soap as you would any other liquid soap. You can apply CHG directly to the skin and wash gently with a scrungie or a clean washcloth.   Apply the CHG Soap to your body ONLY FROM THE NECK DOWN.  Do not use on open wounds or open sores. Avoid contact with your eyes, ears, mouth and genitals (private parts). Wash Face and genitals (private parts)  with your normal soap.   Wash thoroughly, paying special attention to the area where your surgery will be performed.  Thoroughly rinse your body with warm water from the neck down.  DO NOT shower/wash with your normal soap after using and rinsing off the CHG Soap.  Pat yourself dry with a CLEAN TOWEL.  Wear CLEAN PAJAMAS to bed the night before surgery  Place CLEAN SHEETS on your bed the night before your surgery  DO NOT SLEEP WITH PETS.   Day of Surgery:  Take a shower with CHG soap. Wear Clean/Comfortable clothing the morning of surgery Do not apply any deodorants/lotions.   Remember to brush your teeth WITH YOUR REGULAR TOOTHPASTE.    If you received a COVID test during your pre-op visit, it is requested that you wear a mask when out in public, stay away from anyone that may not be feeling well, and notify your surgeon if you develop symptoms. If you have been in contact with anyone that has tested positive in the last 10 days, please notify your surgeon.    Please read over the following fact sheets that you were given.

## 2022-09-07 ENCOUNTER — Encounter (HOSPITAL_COMMUNITY)
Admission: RE | Admit: 2022-09-07 | Discharge: 2022-09-07 | Disposition: A | Discharge status: Home / Self Care | Payer: Medicare Other | Source: Ambulatory Visit | Attending: Neurosurgery | Admitting: Neurosurgery

## 2022-09-07 ENCOUNTER — Other Ambulatory Visit: Payer: Self-pay

## 2022-09-07 ENCOUNTER — Encounter (HOSPITAL_COMMUNITY): Payer: Self-pay

## 2022-09-07 VITALS — BP 132/81 | HR 72 | Temp 98.0°F | Resp 17 | Ht 65.0 in | Wt 168.0 lb

## 2022-09-07 DIAGNOSIS — M5136 Other intervertebral disc degeneration, lumbar region: Secondary | ICD-10-CM | POA: Diagnosis not present

## 2022-09-07 DIAGNOSIS — M4802 Spinal stenosis, cervical region: Secondary | ICD-10-CM | POA: Diagnosis not present

## 2022-09-07 DIAGNOSIS — M4316 Spondylolisthesis, lumbar region: Secondary | ICD-10-CM | POA: Diagnosis not present

## 2022-09-07 DIAGNOSIS — Z01812 Encounter for preprocedural laboratory examination: Secondary | ICD-10-CM | POA: Insufficient documentation

## 2022-09-07 DIAGNOSIS — I700 Atherosclerosis of aorta: Secondary | ICD-10-CM | POA: Diagnosis not present

## 2022-09-07 DIAGNOSIS — R9431 Abnormal electrocardiogram [ECG] [EKG]: Secondary | ICD-10-CM | POA: Diagnosis not present

## 2022-09-07 DIAGNOSIS — Z01818 Encounter for other preprocedural examination: Secondary | ICD-10-CM

## 2022-09-07 DIAGNOSIS — R0789 Other chest pain: Secondary | ICD-10-CM

## 2022-09-07 DIAGNOSIS — Z981 Arthrodesis status: Secondary | ICD-10-CM | POA: Diagnosis not present

## 2022-09-07 DIAGNOSIS — J449 Chronic obstructive pulmonary disease, unspecified: Secondary | ICD-10-CM | POA: Insufficient documentation

## 2022-09-07 DIAGNOSIS — M6250 Muscle wasting and atrophy, not elsewhere classified, unspecified site: Secondary | ICD-10-CM | POA: Insufficient documentation

## 2022-09-07 LAB — BASIC METABOLIC PANEL
Anion gap: 8 (ref 5–15)
BUN: 11 mg/dL (ref 8–23)
CO2: 26 mmol/L (ref 22–32)
Calcium: 8.9 mg/dL (ref 8.9–10.3)
Chloride: 106 mmol/L (ref 98–111)
Creatinine, Ser: 0.68 mg/dL (ref 0.44–1.00)
GFR, Estimated: >60 mL/min (ref >60)
Glucose, Bld: 126 mg/dL — ABNORMAL HIGH (ref 70–99)
Potassium: 3.5 mmol/L (ref 3.5–5.1)
Sodium: 140 mmol/L (ref 135–145)

## 2022-09-07 LAB — CBC
HCT: 42.2 % (ref 36.0–46.0)
Hemoglobin: 13.9 g/dL (ref 12.0–15.0)
MCH: 30 pg (ref 26.0–34.0)
MCHC: 32.9 g/dL (ref 30.0–36.0)
MCV: 90.9 fL (ref 80.0–100.0)
Platelets: 243 10*3/uL (ref 150–400)
RBC: 4.64 MIL/uL (ref 3.87–5.11)
RDW: 12.8 % (ref 11.5–15.5)
WBC: 5.5 10*3/uL (ref 4.0–10.5)
nRBC: 0 % (ref 0.0–0.2)

## 2022-09-07 LAB — SURGICAL PCR SCREEN
MRSA, PCR: NEGATIVE
Staphylococcus aureus: NEGATIVE

## 2022-09-07 NOTE — Progress Notes (Signed)
PCP - Geoffry Paradise Pulmonologist: robert byrum  PPM/ICD - denies   Chest x-ray - n/a EKG - 03/09/22 Stress Test - denies ECHO - 03/09/22 Cardiac Cath - 03/08/22  Sleep Study - denies  Follow your surgeon's instructions on when to stop Asprin.  If no instructions were given by your surgeon then you will need to call the office to get those instructions.     As of today, STOP taking any Aleve, Naproxen, Ibuprofen, Motrin, Advil, Goody's, BC's, all herbal medications, fish oil, and all vitamins.  ERAS Protcol -no   COVID TEST- not needed   Anesthesia review: yes, cardiac cath in 2023- please review  Patient denies shortness of breath, fever, cough and chest pain at PAT appointment   All instructions explained to the patient, with a verbal understanding of the material. Patient agrees to go over the instructions while at home for a better understanding. Patient also instructed to self quarantine after being tested for COVID-19. The opportunity to ask questions was provided.

## 2022-09-10 ENCOUNTER — Ambulatory Visit (HOSPITAL_COMMUNITY): Payer: Medicare Other

## 2022-09-10 ENCOUNTER — Ambulatory Visit (HOSPITAL_COMMUNITY): Payer: Medicare Other | Admitting: Physician Assistant

## 2022-09-10 ENCOUNTER — Other Ambulatory Visit: Payer: Self-pay

## 2022-09-10 ENCOUNTER — Observation Stay (HOSPITAL_COMMUNITY)
Admission: RE | Admit: 2022-09-10 | Discharge: 2022-09-11 | Disposition: A | Discharge status: Home / Self Care | Payer: Medicare Other | Attending: Neurosurgery | Admitting: Neurosurgery

## 2022-09-10 ENCOUNTER — Ambulatory Visit (HOSPITAL_BASED_OUTPATIENT_CLINIC_OR_DEPARTMENT_OTHER): Payer: Medicare Other | Admitting: Certified Registered Nurse Anesthetist

## 2022-09-10 ENCOUNTER — Encounter (HOSPITAL_COMMUNITY): Payer: Self-pay | Admitting: Neurosurgery

## 2022-09-10 ENCOUNTER — Ambulatory Visit (HOSPITAL_COMMUNITY)
Admission: RE | Disposition: A | Discharge status: Home / Self Care | Payer: Self-pay | Source: Home / Self Care | Attending: Neurosurgery

## 2022-09-10 DIAGNOSIS — J449 Chronic obstructive pulmonary disease, unspecified: Secondary | ICD-10-CM | POA: Diagnosis not present

## 2022-09-10 DIAGNOSIS — M4802 Spinal stenosis, cervical region: Secondary | ICD-10-CM | POA: Diagnosis not present

## 2022-09-10 DIAGNOSIS — M4722 Other spondylosis with radiculopathy, cervical region: Principal | ICD-10-CM | POA: Insufficient documentation

## 2022-09-10 DIAGNOSIS — Z79899 Other long term (current) drug therapy: Secondary | ICD-10-CM | POA: Insufficient documentation

## 2022-09-10 DIAGNOSIS — F17210 Nicotine dependence, cigarettes, uncomplicated: Secondary | ICD-10-CM | POA: Insufficient documentation

## 2022-09-10 DIAGNOSIS — Z7982 Long term (current) use of aspirin: Secondary | ICD-10-CM | POA: Diagnosis not present

## 2022-09-10 DIAGNOSIS — M5412 Radiculopathy, cervical region: Secondary | ICD-10-CM | POA: Diagnosis present

## 2022-09-10 DIAGNOSIS — M1990 Unspecified osteoarthritis, unspecified site: Secondary | ICD-10-CM | POA: Diagnosis not present

## 2022-09-10 HISTORY — PX: ANTERIOR CERVICAL DECOMP/DISCECTOMY FUSION: SHX1161

## 2022-09-10 SURGERY — ANTERIOR CERVICAL DECOMPRESSION/DISCECTOMY FUSION 1 LEVEL
Anesthesia: General | Site: Spine Cervical

## 2022-09-10 MED ORDER — TRAMADOL HCL 50 MG PO TABS: 100.0000 mg | ORAL_TABLET | Freq: Two times a day (BID) | ORAL | Status: DC | PRN

## 2022-09-10 MED ORDER — PREGABALIN 100 MG PO CAPS
100.0000 mg | ORAL_CAPSULE | Freq: Three times a day (TID) | ORAL | Status: DC
  Administered 2022-09-10 – 2022-09-11 (×2): 100 mg via ORAL
  Filled 2022-09-10 (×2): qty 1

## 2022-09-10 MED ORDER — SUGAMMADEX SODIUM 200 MG/2ML IV SOLN
INTRAVENOUS | Status: DC | PRN
  Administered 2022-09-10: 200 mg via INTRAVENOUS
  Administered 2022-09-10: 100 mg via INTRAVENOUS

## 2022-09-10 MED ORDER — DEXAMETHASONE SODIUM PHOSPHATE 10 MG/ML IJ SOLN
INTRAMUSCULAR | Status: AC
  Filled 2022-09-10: qty 1

## 2022-09-10 MED ORDER — ALBUTEROL SULFATE (2.5 MG/3ML) 0.083% IN NEBU: 2.5000 mg | INHALATION_SOLUTION | Freq: Four times a day (QID) | RESPIRATORY_TRACT | Status: DC | PRN

## 2022-09-10 MED ORDER — ROCURONIUM BROMIDE 10 MG/ML (PF) SYRINGE
PREFILLED_SYRINGE | INTRAVENOUS | Status: AC
  Filled 2022-09-10: qty 10

## 2022-09-10 MED ORDER — SODIUM CHLORIDE 0.9 % IV SOLN
250.0000 mL | INTRAVENOUS | Status: DC
  Administered 2022-09-10: 250 mL via INTRAVENOUS

## 2022-09-10 MED ORDER — MIDAZOLAM HCL 2 MG/2ML IJ SOLN
INTRAMUSCULAR | Status: DC | PRN
  Administered 2022-09-10: 2 mg via INTRAVENOUS

## 2022-09-10 MED ORDER — VITAMIN C 500 MG PO TABS
500.0000 mg | ORAL_TABLET | Freq: Every day | ORAL | Status: DC
  Administered 2022-09-11: 500 mg via ORAL
  Filled 2022-09-10: qty 1

## 2022-09-10 MED ORDER — AMISULPRIDE (ANTIEMETIC) 5 MG/2ML IV SOLN: 10.0000 mg | Freq: Once | INTRAVENOUS | Status: DC | PRN

## 2022-09-10 MED ORDER — POLYETHYLENE GLYCOL 3350 17 G PO PACK: 17.0000 g | PACK | Freq: Every day | ORAL | Status: DC

## 2022-09-10 MED ORDER — ROCURONIUM BROMIDE 10 MG/ML (PF) SYRINGE
PREFILLED_SYRINGE | INTRAVENOUS | Status: DC | PRN
  Administered 2022-09-10: 50 mg via INTRAVENOUS
  Administered 2022-09-10: 10 mg via INTRAVENOUS

## 2022-09-10 MED ORDER — MIDAZOLAM HCL 2 MG/2ML IJ SOLN
INTRAMUSCULAR | Status: AC
  Filled 2022-09-10: qty 2

## 2022-09-10 MED ORDER — ACETAMINOPHEN 650 MG RE SUPP: 650.0000 mg | RECTAL | Status: DC | PRN

## 2022-09-10 MED ORDER — DEXAMETHASONE SODIUM PHOSPHATE 10 MG/ML IJ SOLN
INTRAMUSCULAR | Status: DC | PRN
  Administered 2022-09-10: 10 mg via INTRAVENOUS

## 2022-09-10 MED ORDER — PHENYLEPHRINE 80 MCG/ML (10ML) SYRINGE FOR IV PUSH (FOR BLOOD PRESSURE SUPPORT)
PREFILLED_SYRINGE | INTRAVENOUS | Status: AC
  Filled 2022-09-10: qty 10

## 2022-09-10 MED ORDER — VITAMIN D 25 MCG (1000 UNIT) PO TABS: 5000.0000 [IU] | ORAL_TABLET | Freq: Every day | ORAL | Status: DC

## 2022-09-10 MED ORDER — ACETAMINOPHEN 10 MG/ML IV SOLN
INTRAVENOUS | Status: AC
  Filled 2022-09-10: qty 100

## 2022-09-10 MED ORDER — CEFAZOLIN SODIUM-DEXTROSE 1-4 GM/50ML-% IV SOLN
1.0000 g | Freq: Three times a day (TID) | INTRAVENOUS | Status: AC
  Administered 2022-09-10 – 2022-09-11 (×2): 1 g via INTRAVENOUS
  Filled 2022-09-10 (×2): qty 50

## 2022-09-10 MED ORDER — ESTRADIOL 0.1 MG/GM VA CREA: 0.5000 g | TOPICAL_CREAM | VAGINAL | Status: DC

## 2022-09-10 MED ORDER — CHLORHEXIDINE GLUCONATE CLOTH 2 % EX PADS: 6.0000 | MEDICATED_PAD | Freq: Once | CUTANEOUS | Status: DC

## 2022-09-10 MED ORDER — HYDROMORPHONE HCL 1 MG/ML IJ SOLN
1.0000 mg | INTRAMUSCULAR | Status: DC | PRN
  Administered 2022-09-10: 0.5 mg via INTRAVENOUS
  Filled 2022-09-10: qty 1

## 2022-09-10 MED ORDER — THROMBIN 5000 UNITS EX SOLR
CUTANEOUS | Status: AC
  Filled 2022-09-10: qty 5000

## 2022-09-10 MED ORDER — LIDOCAINE 2% (20 MG/ML) 5 ML SYRINGE
INTRAMUSCULAR | Status: DC | PRN
  Administered 2022-09-10: 70 mg via INTRAVENOUS

## 2022-09-10 MED ORDER — FENTANYL CITRATE (PF) 250 MCG/5ML IJ SOLN
INTRAMUSCULAR | Status: DC | PRN
  Administered 2022-09-10 (×2): 100 ug via INTRAVENOUS
  Administered 2022-09-10: 50 ug via INTRAVENOUS

## 2022-09-10 MED ORDER — FENTANYL CITRATE (PF) 250 MCG/5ML IJ SOLN
INTRAMUSCULAR | Status: AC
  Filled 2022-09-10: qty 5

## 2022-09-10 MED ORDER — THROMBIN 5000 UNITS EX SOLR
CUTANEOUS | Status: AC
  Filled 2022-09-10: qty 10000

## 2022-09-10 MED ORDER — ORAL CARE MOUTH RINSE: 15.0000 mL | Freq: Once | OROMUCOSAL | Status: AC

## 2022-09-10 MED ORDER — THROMBIN 5000 UNITS EX SOLR
OROMUCOSAL | Status: DC | PRN
  Administered 2022-09-10: 5 mL via TOPICAL

## 2022-09-10 MED ORDER — ONDANSETRON HCL 4 MG/2ML IJ SOLN: 4.0000 mg | Freq: Four times a day (QID) | INTRAMUSCULAR | Status: DC | PRN

## 2022-09-10 MED ORDER — ACETAMINOPHEN 325 MG PO TABS: 650.0000 mg | ORAL_TABLET | ORAL | Status: DC | PRN

## 2022-09-10 MED ORDER — THROMBIN (RECOMBINANT) 5000 UNITS EX SOLR
CUTANEOUS | Status: DC | PRN
  Administered 2022-09-10: 10 mL via TOPICAL

## 2022-09-10 MED ORDER — 0.9 % SODIUM CHLORIDE (POUR BTL) OPTIME
TOPICAL | Status: DC | PRN
  Administered 2022-09-10: 1000 mL

## 2022-09-10 MED ORDER — CHLORHEXIDINE GLUCONATE 0.12 % MT SOLN
15.0000 mL | Freq: Once | OROMUCOSAL | Status: AC
  Administered 2022-09-10: 15 mL via OROMUCOSAL
  Filled 2022-09-10: qty 15

## 2022-09-10 MED ORDER — PHENYLEPHRINE 80 MCG/ML (10ML) SYRINGE FOR IV PUSH (FOR BLOOD PRESSURE SUPPORT)
PREFILLED_SYRINGE | INTRAVENOUS | Status: DC | PRN
  Administered 2022-09-10: 80 ug via INTRAVENOUS

## 2022-09-10 MED ORDER — LACTATED RINGERS IV SOLN: INTRAVENOUS | Status: DC

## 2022-09-10 MED ORDER — MENTHOL 3 MG MT LOZG: 1.0000 | LOZENGE | OROMUCOSAL | Status: DC | PRN

## 2022-09-10 MED ORDER — OXYCODONE-ACETAMINOPHEN 5-325 MG PO TABS
1.0000 | ORAL_TABLET | ORAL | Status: DC | PRN
  Administered 2022-09-10: 2 via ORAL
  Administered 2022-09-11: 1 via ORAL
  Administered 2022-09-11 (×2): 2 via ORAL
  Filled 2022-09-10 (×2): qty 2
  Filled 2022-09-10: qty 1
  Filled 2022-09-10: qty 2

## 2022-09-10 MED ORDER — SODIUM CHLORIDE 0.9% FLUSH: 3.0000 mL | INTRAVENOUS | Status: DC | PRN

## 2022-09-10 MED ORDER — ONDANSETRON HCL 4 MG/2ML IJ SOLN
INTRAMUSCULAR | Status: DC | PRN
  Administered 2022-09-10: 4 mg via INTRAVENOUS

## 2022-09-10 MED ORDER — ONDANSETRON HCL 4 MG PO TABS: 4.0000 mg | ORAL_TABLET | Freq: Four times a day (QID) | ORAL | Status: DC | PRN

## 2022-09-10 MED ORDER — ALPRAZOLAM 0.5 MG PO TABS
1.0000 mg | ORAL_TABLET | Freq: Three times a day (TID) | ORAL | Status: DC
  Administered 2022-09-10: 1 mg via ORAL
  Filled 2022-09-10: qty 2

## 2022-09-10 MED ORDER — SODIUM CHLORIDE 0.9% FLUSH
3.0000 mL | Freq: Two times a day (BID) | INTRAVENOUS | Status: DC
  Administered 2022-09-10: 3 mL via INTRAVENOUS

## 2022-09-10 MED ORDER — CEFAZOLIN SODIUM-DEXTROSE 2-4 GM/100ML-% IV SOLN
2.0000 g | INTRAVENOUS | Status: AC
  Administered 2022-09-10: 2 g via INTRAVENOUS
  Filled 2022-09-10: qty 100

## 2022-09-10 MED ORDER — FENTANYL CITRATE (PF) 100 MCG/2ML IJ SOLN
25.0000 ug | INTRAMUSCULAR | Status: DC | PRN
  Administered 2022-09-10 (×4): 25 ug via INTRAVENOUS

## 2022-09-10 MED ORDER — PHENOL 1.4 % MT LIQD: 1.0000 | OROMUCOSAL | Status: DC | PRN

## 2022-09-10 MED ORDER — ACETAMINOPHEN 10 MG/ML IV SOLN
1000.0000 mg | Freq: Once | INTRAVENOUS | Status: DC | PRN
  Administered 2022-09-10: 1000 mg via INTRAVENOUS

## 2022-09-10 MED ORDER — ONDANSETRON HCL 4 MG/2ML IJ SOLN: 4.0000 mg | Freq: Once | INTRAMUSCULAR | Status: DC | PRN

## 2022-09-10 MED ORDER — LIDOCAINE 2% (20 MG/ML) 5 ML SYRINGE
INTRAMUSCULAR | Status: AC
  Filled 2022-09-10: qty 5

## 2022-09-10 MED ORDER — FENTANYL CITRATE (PF) 100 MCG/2ML IJ SOLN
INTRAMUSCULAR | Status: AC
  Filled 2022-09-10: qty 2

## 2022-09-10 MED ORDER — CYCLOBENZAPRINE HCL 10 MG PO TABS
10.0000 mg | ORAL_TABLET | Freq: Three times a day (TID) | ORAL | Status: DC | PRN
  Administered 2022-09-11: 10 mg via ORAL
  Filled 2022-09-10: qty 1

## 2022-09-10 MED ORDER — ATORVASTATIN CALCIUM 10 MG PO TABS
20.0000 mg | ORAL_TABLET | Freq: Every day | ORAL | Status: DC
  Administered 2022-09-10: 20 mg via ORAL
  Filled 2022-09-10: qty 2

## 2022-09-10 MED ORDER — ONDANSETRON HCL 4 MG/2ML IJ SOLN
INTRAMUSCULAR | Status: AC
  Filled 2022-09-10: qty 2

## 2022-09-10 MED ORDER — PROPOFOL 10 MG/ML IV BOLUS
INTRAVENOUS | Status: DC | PRN
  Administered 2022-09-10: 130 mg via INTRAVENOUS
  Administered 2022-09-10: 20 mg via INTRAVENOUS

## 2022-09-10 SURGICAL SUPPLY — 54 items
BAG COUNTER SPONGE SURGICOUNT (BAG) ×1 IMPLANT
BAG DECANTER FOR FLEXI CONT (MISCELLANEOUS) ×1 IMPLANT
BAND RUBBER #18 3X1/16 STRL (MISCELLANEOUS) ×2 IMPLANT
BENZOIN TINCTURE AMPULE (MISCELLANEOUS) IMPLANT
BENZOIN TINCTURE PRP APPL 2/3 (GAUZE/BANDAGES/DRESSINGS) ×1 IMPLANT
BIT DRILL 13 (BIT) IMPLANT
BUR MATCHSTICK NEURO 3.0 LAGG (BURR) ×1 IMPLANT
CAGE PEEK 6X14X11 (Cage) ×1 IMPLANT
CANISTER SUCT 3000ML PPV (MISCELLANEOUS) ×1 IMPLANT
CARTRIDGE OIL MAESTRO DRILL (MISCELLANEOUS) ×1 IMPLANT
CLSR STERI-STRIP ANTIMIC 1/2X4 (GAUZE/BANDAGES/DRESSINGS) IMPLANT
DIFFUSER DRILL AIR PNEUMATIC (MISCELLANEOUS) ×1 IMPLANT
DRAPE C-ARM 42X72 X-RAY (DRAPES) ×2 IMPLANT
DRAPE LAPAROTOMY 100X72 PEDS (DRAPES) ×1 IMPLANT
DRAPE MICROSCOPE SLANT 54X150 (MISCELLANEOUS) ×1 IMPLANT
DURAPREP 6ML APPLICATOR 50/CS (WOUND CARE) ×1 IMPLANT
ELECT COATED BLADE 2.86 ST (ELECTRODE) ×1 IMPLANT
ELECT REM PT RETURN 9FT ADLT (ELECTROSURGICAL) ×1
ELECTRODE REM PT RTRN 9FT ADLT (ELECTROSURGICAL) ×1 IMPLANT
GAUZE 4X4 16PLY ~~LOC~~+RFID DBL (SPONGE) IMPLANT
GAUZE SPONGE 4X4 12PLY STRL (GAUZE/BANDAGES/DRESSINGS) ×1 IMPLANT
GLOVE ECLIPSE 9.0 STRL (GLOVE) ×1 IMPLANT
GLOVE EXAM NITRILE XL STR (GLOVE) IMPLANT
GOWN STRL REUS W/ TWL LRG LVL3 (GOWN DISPOSABLE) IMPLANT
GOWN STRL REUS W/ TWL XL LVL3 (GOWN DISPOSABLE) IMPLANT
GOWN STRL REUS W/TWL 2XL LVL3 (GOWN DISPOSABLE) IMPLANT
GOWN STRL REUS W/TWL LRG LVL3 (GOWN DISPOSABLE) ×2
GOWN STRL REUS W/TWL XL LVL3 (GOWN DISPOSABLE) ×3
HALTER HD/CHIN CERV TRACTION D (MISCELLANEOUS) ×1 IMPLANT
HEMOSTAT POWDER KIT SURGIFOAM (HEMOSTASIS) IMPLANT
KIT BASIN OR (CUSTOM PROCEDURE TRAY) ×1 IMPLANT
KIT TURNOVER KIT B (KITS) ×1 IMPLANT
NDL SPNL 20GX3.5 QUINCKE YW (NEEDLE) ×1 IMPLANT
NEEDLE SPNL 20GX3.5 QUINCKE YW (NEEDLE) ×1 IMPLANT
NS IRRIG 1000ML POUR BTL (IV SOLUTION) ×1 IMPLANT
OIL CARTRIDGE MAESTRO DRILL (MISCELLANEOUS) ×1
PACK LAMINECTOMY NEURO (CUSTOM PROCEDURE TRAY) ×1 IMPLANT
PAD ARMBOARD 7.5X6 YLW CONV (MISCELLANEOUS) ×3 IMPLANT
PLATE 23MM (Plate) IMPLANT
SCREW ST 13X4XST VA NS SPNE (Screw) IMPLANT
SCREW ST VAR 4 ATL (Screw) ×4 IMPLANT
SPACER SPNL 11X14X6XPEEK CVD (Cage) IMPLANT
SPCR SPNL 11X14X6XPEEK CVD (Cage) ×1 IMPLANT
SPONGE INTESTINAL PEANUT (DISPOSABLE) ×1 IMPLANT
SPONGE SURGIFOAM ABS GEL SZ50 (HEMOSTASIS) ×1 IMPLANT
STRIP CLOSURE SKIN 1/2X4 (GAUZE/BANDAGES/DRESSINGS) ×1 IMPLANT
SUT VIC AB 3-0 SH 8-18 (SUTURE) ×1 IMPLANT
SUT VIC AB 4-0 RB1 18 (SUTURE) ×1 IMPLANT
TAPE CLOTH 4X10 WHT NS (GAUZE/BANDAGES/DRESSINGS) ×1 IMPLANT
TAPE CLOTH SURG 4X10 WHT LF (GAUZE/BANDAGES/DRESSINGS) IMPLANT
TOWEL GREEN STERILE (TOWEL DISPOSABLE) ×1 IMPLANT
TOWEL GREEN STERILE FF (TOWEL DISPOSABLE) ×1 IMPLANT
TRAP SPECIMEN MUCUS 40CC (MISCELLANEOUS) ×1 IMPLANT
WATER STERILE IRR 1000ML POUR (IV SOLUTION) ×1 IMPLANT

## 2022-09-10 NOTE — H&P (Signed)
Alyssa [name] is an 66 y.o. female.   Chief Complaint: Neck and left arm pain HPI: 66 year old female with persistent cervical pain with radiation to her left scapular region and radiation into her left arm forearm and radial aspect of her left hand.  She has some associated numbness and some intermittent subjective weakness.  Patient has failed conservative management.  She has no right-sided symptoms.  She has no lower extremity dysfunction.  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   Chronic pain    COPD (chronic obstructive pulmonary disease) (HCC)    Headache    Marijuana abuse    Nerve damage    to back and legs   Primary localized osteoarthrosis, lower leg    oa   Pulmonary nodules    Tobacco abuse    Wears dentures top   Wears glasses     Past Surgical History:  Procedure Laterality Date   ABDOMINAL HYSTERECTOMY  yrs ago   1 ovary left   ANTERIOR AND POSTERIOR REPAIR N/A 05/30/2021   Procedure: ANTERIOR (CYSTOCELE) AND POSTERIOR REPAIR (RECTOCELE);  Surgeon: Lavina Hamman, MD;  Location: [facility];  Service: Gynecology;  Laterality: N/A;   BACK SURGERY  yrs ago   x3 bolts  and screws and rods x 2 lower back   BREAST BIOPSY Left    COLPOCLEISIS N/A 03/22/2022   Procedure: EXAM UNDER ANESTHESIA, COLPOCLEISIS; PERINEORRHAPHY;  Surgeon: Marguerita Beards, MD;  Location: [facility];  Service: Gynecology;  Laterality: N/A;   CYSTOSCOPY N/A 03/22/2022   Procedure: CYSTOSCOPY;  Surgeon: Marguerita Beards, MD;  Location: [facility];  Service: Gynecology;  Laterality: N/A;   HEMORRHOID SURGERY  yrs ago   LEFT HEART CATH AND CORONARY ANGIOGRAPHY N/A 03/08/2022   Procedure: LEFT HEART CATH AND CORONARY  ANGIOGRAPHY;  Surgeon: Kathleene Hazel, MD;  [facility];  Service: Cardiovascular;  Laterality: N/A;   ROBOTIC ASSISTED LAPAROSCOPIC SACROCOLPOPEXY N/A 02/13/2022   Procedure: XI ROBOTIC ASSISTED LAPAROSCOPIC LYSIS OF ADHESIONS;  Surgeon: Marguerita Beards, MD;  Location: [facility];  Service: Gynecology;  Laterality: N/A;   TONSILLECTOMY     VAGINAL PROLAPSE REPAIR N/A 10/19/2021   Procedure: VAGINAL VAULT SUSPENSION;  Surgeon: Lavina Hamman, MD;  Location: [facility];  Service: Gynecology;  Laterality: N/A;   VULVAR LESION REMOVAL N/A 05/30/2021   Procedure: REMOVAL OF VULVAR CYST;  Surgeon: Lavina Hamman, MD;  Location: [facility] ;  Service: Gynecology;  Laterality: N/A;    Family History  Problem Relation Age of Onset   Stroke Mother    Hypertension Father    Breast cancer Paternal Aunt    Social History:  reports that she has been smoking cigarettes. She has a 20.00 pack-year smoking history. She has never used smokeless tobacco. She reports that she does not drink alcohol and does not use drugs.  Allergies:  Allergies  Allergen Reactions   Bee Venom Anaphylaxis   Arthrotec [Diclofenac-Misoprostol] Other (See Comments)    Not sure   Codeine Itching    headache   Cymbalta [Duloxetine Hcl] Other (See Comments)    "felt I was sinking into the floor"   Effexor [Venlafaxine Hydrochloride] Other (See Comments)    confusion  Escitalopram Oxalate Other (See Comments)    confusion   Hydrocodone Other (See Comments)    Severe headache, itching   Neurontin [Gabapentin] Other (See Comments)    Not sure   Oxymorphone Hcl Other (See Comments)    "It didn't work"   Pentazocine Lactate Other (See Comments)    Not sure   Provigil [Modafinil] Other (See Comments)    Not sure   Sertraline Hcl Other (See Comments)    More depressed    Medications Prior to Admission  Medication Sig Dispense Refill    acetaminophen (TYLENOL) 500 MG tablet Take 1 tablet (500 mg total) by mouth every 6 (six) hours as needed (pain). (Patient taking differently: Take 500 mg by mouth every 6 (six) hours as needed for headache (pain).) 30 tablet 0   ALPRAZolam (XANAX) 1 MG tablet Take 1 mg by mouth 3 (three) times daily.     ascorbic acid (VITAMIN C) 500 MG tablet Take 500 mg by mouth daily.     aspirin EC 81 MG tablet Take 81 mg by mouth at bedtime. Swallow whole.     Cholecalciferol (D3 MAXIMUM STRENGTH) 125 MCG (5000 UT) capsule Take 5,000 Units by mouth daily.     HYDROcodone-acetaminophen (NORCO/VICODIN) 5-325 MG tablet Take 1 tablet by mouth every 6 (six) hours as needed for pain.     ibuprofen (ADVIL) 600 MG tablet Take 1 tablet (600 mg total) by mouth every 6 (six) hours as needed. (Patient taking differently: Take 600 mg by mouth every 6 (six) hours as needed for mild pain.) 30 tablet 0   methocarbamol (ROBAXIN) 750 MG tablet Take 750 mg by mouth 3 (three) times daily.     oxyCODONE-acetaminophen (PERCOCET/ROXICET) 5-325 MG tablet Take 1 tablet by mouth every 6 (six) hours as needed for pain.     polyethylene glycol powder (GLYCOLAX/MIRALAX) 17 GM/SCOOP powder Take 17 g by mouth daily. Drink 17g (1 scoop) dissolved in water per day. (Patient taking differently: Take 17 g by mouth daily.) 255 g 0   pregabalin (LYRICA) 100 MG capsule Take 100 mg by mouth 3 (three) times daily.     traMADol (ULTRAM) 50 MG tablet Take 100 mg by mouth in the morning, at noon, and at bedtime.     albuterol (VENTOLIN HFA) 108 (90 Base) MCG/ACT inhaler Inhale 2 puffs into the lungs every 6 (six) hours as needed for wheezing or shortness of breath. 8 g 6   atorvastatin (LIPITOR) 20 MG tablet TAKE 1 TABLET BY MOUTH EVERY DAY (Patient not taking: Reported on 09/05/2022) 90 tablet 0   estradiol (ESTRACE) 0.1 MG/GM vaginal cream Place 0.5 g vaginally 2 (two) times a week. Place 0.5g nightly for two weeks then twice a week after (Patient not  taking: Reported on 09/05/2022) 30 g 11   fluticasone (FLONASE) 50 MCG/ACT nasal spray SPRAY 2 SPRAYS INTO EACH NOSTRIL EVERY DAY (Patient not taking: Reported on 09/05/2022) 48 mL 3    No results found for this or any previous visit (from the past 48 hour(s)). No results found.  Pertinent items noted in HPI and remainder of comprehensive ROS otherwise negative.  Blood pressure 116/76, pulse 64, temperature 97.7 F (36.5 C), temperature source Oral, resp. rate 17, height 5\' 5"  (1.651 m), weight 76.2 kg, SpO2 98 %.  Patient is awake and alert.  She is oriented and appropriate.  Speech is fluent.  Judgment insight are intact.  Cranial nerve function normal bilateral.  Motor examination reveals mild weakness of  wrist extension on the left side but otherwise motor strength intact.  Sensory examination with some decrease sensation pinprick light touch in her left C6 dermatome.  Deep Temrex is normal active.  No evidence of long track signs.  Gait and posture normal peer examination head ears eyes nose and throat is unremarked.  Chest and abdomen are benign.  Extremities are free of major deformity. Assessment/Plan C5-6 spondylosis with foraminal stenosis and left-sided C6 radiculopathy.  Plan C5-6 anterior cervical discectomy and fusion with interbody cage, local harvested autograft, and anterior plate instrumentation.  Risks benefits been explained.  Patient wishes to proceed.  Sherilyn Cooter A Mairin Lindsley 09/10/2022, 1:02 PM

## 2022-09-10 NOTE — Progress Notes (Signed)
Anesthesia Chart Review:  Case: [id number] Date/Time: 09/10/22 1300   Procedure: ACDF - C5-C6 - 3C   Anesthesia type: General   Pre-op diagnosis: Stenosis   [facility] ROOM [facility] / [facility]   Surgeons: Julio Sicks, MD       DISCUSSION: Patient is a 66 year old female scheduled for the above procedure.  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   [facility] admission 03/08/22-03/09/22 for throat pain and ECG findings concerning for inferolateral STEMI, so taken emergently to the cath lab where results showed only mild CAD with 30% D1, 20% proximal-mid LAD, EF 65%, no regional wall motion abnormalities or aortic dissection. Echo showed EF 60-65%, normal RVSF, no MR/MS, AV not well visualized but no AR noted. As needed cardiology follow-up recommended. COVID test was negative. Recent CT neck soft tissue without acute abnormality. She had known pulmonary nodules followed by pulmonology, and had an episode of hemoptysis on 03/02/22 in setting of recent intubation, baseline ASA use and flare of allergies +/- vial illness. Continued allergy treatment and monitoring for any hemoptysis recurrence, but otherwise continue with out-patient CT imaging for follow-up pulmonary nodules with plan for follow-up imaging in 3 months. (She had follow-up chest CT on 06/20/22 that showed partial clearing of largest/most concerning LLL nodule suggesting post-inflammatory and stable LLL clustered nodules also favored to be post-inflammatory with on-going follow-up recommended. Plan for repeat CT in June 2024 per Dr. Delton Coombes.)  Anesthesia team to evaluate on the day of surgery.     VS: BP 132/81   Pulse 72    Temp 36.7 C   Resp 17   Ht 5\' 5"  (1.651 m)   Wt 76.2 kg   SpO2 97%   BMI 27.96 kg/m    PROVIDERS: , MD is PCP  Geoffry Paradise, MD is pulmonologist. Last visit 06/26/22. Next imaging for follow-up pulmonary nodules is planned for June 2024. Minimal clinical symptoms of COPD although with some exertional SOB with chores. Prescribed albuterol. She was trying to cut down on smoking, down to 15 cig/day.  July 2024, MD is GYN Lanetta Inch is cardiologist (in-patient evaluaiton 02/2012). As needed follow-up recommended.    LABS: Labs reviewed: Acceptable for surgery. A1c 5.3% on 03/09/22.  (all labs ordered are listed, but only abnormal results are displayed)  Labs Reviewed  BASIC METABOLIC PANEL - Abnormal; Notable for the following components:      Result Value   Glucose, Bld 126 (*)    All other components within normal limits  SURGICAL PCR SCREEN  CBC    IMAGES: CT Super D Chest 06/20/22: IMPRESSION: 1. Imaging for bronchoscopy planning and guidance. 2. Interval partial clearing of the patchy ground-glass and part solid pulmonary nodules compared with the prior study of 4 months ago, most likely reflecting resolving inflammation. 3. Clustered solid nodules medially in the left lower lobe have not significantly changed, favored to be postinflammatory. Suggest CT follow-up in 6 months. 4. Coronary and aortic atherosclerosis (ICD10-I70.0). Emphysema (ICD10-J43.9). 5. Probable sebaceous cysts bilaterally in the upper back, enlarged from 2009, but unchanged from more recent study.   MRI L-spine 05/15/22: IMPRESSION: 1. Postsurgical changes reflecting posterior instrumented fusion and decompression from L3 through S1 without significant spinal canal or neural foraminal stenosis. 2. Adjacent segment disease at L2-L3 with mild  retrolisthesis, facet arthropathy, disc degeneration and mild bulge, and degenerative endplate marrow edema resulting in  right worse than left subarticular zone narrowing and mild bilateral neural foraminal stenosis. 3. Fluid collection in the surgical bed posterior to the L4 and L5 levels without convincing dural tear is favored to reflect a chronic postoperative seroma. 4. Levocurvature centered at L3.  CT neck soft tissue 02/27/22: IMPRESSION: 1. No acute or focal lesion to explain the patient's symptoms. 2. No significant adenopathy. 3. Mild generalized muscle atrophy. No discrete lesion or significant asymmetry. 4. Degenerative changes of the cervical spine are most evident at C5-6 with left greater than right uncovertebral spurring and foraminal narrowing. 5. Aortic Atherosclerosis (ICD10-I70.0).    EKG: 03/09/22: Normal sinus rhythm ST elevation in Inferolateral leads , consider ischemia vs possible pericarditis pattern Abnormal ECG When compared with ECG of 09-Mar-2022 01:20, ST elevations are more prominent Confirmed by Gwyndolyn Kaufman [phone]) on 03/09/2022 10:50:42 AM   CV: Echo 03/09/22: IMPRESSIONS   1. Left ventricular ejection fraction, by estimation, is 60 to 65%. The  left ventricle has normal function. The left ventricle has no regional  wall motion abnormalities. Left ventricular diastolic parameters were  normal. The average left ventricular  global longitudinal strain is -18.0 %. The global longitudinal strain is  normal.   2. Right ventricular systolic function is normal. The right ventricular  size is normal. Tricuspid regurgitation signal is inadequate for assessing  PA pressure.   3. The mitral valve is normal in structure. No evidence of mitral valve  regurgitation. No evidence of mitral stenosis.   4. The aortic valve was not well visualized. Aortic valve regurgitation  is not visualized.   5. The inferior vena cava is normal in size with greater than 50%  respiratory variability, suggesting right atrial pressure of 3 mmHg.  - Comparison(s): No prior  Echocardiogram.    RHC/LHC 03/08/22:   1st Diag lesion is 30% stenosed.   Prox LAD to Mid LAD lesion is 20% stenosed.   The left ventricular systolic function is normal.   LV end diastolic pressure is normal.   The left ventricular ejection fraction is greater than 65% by visual estimate.   There is no mitral valve regurgitation.   Mild non-obstructive disease in the Diagonal/mid LAD Moderate caliber Circumflex with no obstructive disease Large dominant RCA with no obstructive disease Hyperdynamic LV systolic function No evidence of dilated aortic root or ascending aorta dissection   Recommendations: Will monitor overnight. Consider other causes of neck pain, chest burning and abnormal EKG.    Past Medical History:  Diagnosis Date   Bright's disease age 71   issue resolved   Chronic pain    COPD (chronic obstructive pulmonary disease) (HCC)    Headache    Marijuana abuse    Nerve damage    to back and legs   Primary localized osteoarthrosis, lower leg    oa   Pulmonary nodules    Tobacco abuse    Wears dentures top   Wears glasses     Past Surgical History:  Procedure Laterality Date   ABDOMINAL HYSTERECTOMY  yrs ago   1 ovary left   ANTERIOR AND POSTERIOR REPAIR N/A 05/30/2021   Procedure: ANTERIOR (CYSTOCELE) AND POSTERIOR REPAIR (RECTOCELE);  Surgeon: Cheri Fowler, MD;  Location: [facility];  Service: Gynecology;  Laterality: N/A;   BACK SURGERY  yrs ago   x3 bolts  and screws and rods x 2 lower back   BREAST  BIOPSY Left    COLPOCLEISIS N/A 03/22/2022   Procedure: EXAM UNDER ANESTHESIA, COLPOCLEISIS; PERINEORRHAPHY;  Surgeon: Marguerita Beards, MD;  Location: [facility];  Service: Gynecology;  Laterality: N/A;   CYSTOSCOPY N/A 03/22/2022   Procedure: CYSTOSCOPY;  Surgeon: Marguerita Beards, MD;  Location: [facility];  Service: Gynecology;  Laterality: N/A;   HEMORRHOID SURGERY  yrs ago   LEFT HEART CATH AND  CORONARY ANGIOGRAPHY N/A 03/08/2022   Procedure: LEFT HEART CATH AND CORONARY ANGIOGRAPHY;  Surgeon: Kathleene Hazel, MD;  [facility];  Service: Cardiovascular;  Laterality: N/A;   ROBOTIC ASSISTED LAPAROSCOPIC SACROCOLPOPEXY N/A 02/13/2022   Procedure: XI ROBOTIC ASSISTED LAPAROSCOPIC LYSIS OF ADHESIONS;  Surgeon: Marguerita Beards, MD;  Location: [facility];  Service: Gynecology;  Laterality: N/A;   TONSILLECTOMY     VAGINAL PROLAPSE REPAIR N/A 10/19/2021   Procedure: VAGINAL VAULT SUSPENSION;  Surgeon: Lavina Hamman, MD;  Location: [facility];  Service: Gynecology;  Laterality: N/A;   VULVAR LESION REMOVAL N/A 05/30/2021   Procedure: REMOVAL OF VULVAR CYST;  Surgeon: Lavina Hamman, MD;  Location: [facility];  Service: Gynecology;  Laterality: N/A;    MEDICATIONS:  acetaminophen (TYLENOL) 500 MG tablet   albuterol (VENTOLIN HFA) 108 (90 Base) MCG/ACT inhaler   ALPRAZolam (XANAX) 1 MG tablet   ascorbic acid (VITAMIN C) 500 MG tablet   aspirin EC 81 MG tablet   atorvastatin (LIPITOR) 20 MG tablet   Cholecalciferol (D3 MAXIMUM STRENGTH) 125 MCG (5000 UT) capsule   estradiol (ESTRACE) 0.1 MG/GM vaginal cream   fluticasone (FLONASE) 50 MCG/ACT nasal spray   HYDROcodone-acetaminophen (NORCO/VICODIN) 5-325 MG tablet   ibuprofen (ADVIL) 600 MG tablet   methocarbamol (ROBAXIN) 750 MG tablet   oxyCODONE-acetaminophen (PERCOCET/ROXICET) 5-325 MG tablet   polyethylene glycol powder (GLYCOLAX/MIRALAX) 17 GM/SCOOP powder   pregabalin (LYRICA) 100 MG capsule   traMADol (ULTRAM) 50 MG tablet   No current facility-administered medications for this encounter.    Shonna Chock, PA-C Surgical Short Stay/Anesthesiology [facility] Phone [phone] [facility] Phone [phone] 09/10/2022 9:48 AM

## 2022-09-10 NOTE — Transfer of Care (Signed)
Immediate Anesthesia Transfer of Care Note  Patient: Alyssa [name]  Procedure(s) Performed: Anterior Cervical Decompression Fusion - Cervical five-Cervical six (Spine Cervical)  Patient Location: PACU  Anesthesia Type:General  Level of Consciousness: awake  Airway & Oxygen Therapy: Patient Spontanous Breathing and Patient connected to face mask oxygen  Post-op Assessment: Report given to RN and Post -op Vital signs reviewed and stable  Post vital signs: Reviewed and stable  Last Vitals:  Vitals Value Taken Time  BP 180/103 09/10/22 1507  Temp    Pulse 76 09/10/22 1509  Resp 16 09/10/22 1509  SpO2 96 % 09/10/22 1509  Vitals shown include unvalidated device data.  Last Pain:  Vitals:   09/10/22 1108  TempSrc:   PainSc: 0-No pain         Complications: No notable events documented.

## 2022-09-10 NOTE — Op Note (Signed)
Date of procedure: 09/10/2022  Date of dictation: Same  Service: Neurosurgery  Preoperative diagnosis: C5-6 spondylosis with foraminal stenosis and radiculopathy  Postoperative diagnosis: Same  Procedure Name: See 5 6 anterior cervical discectomy with interbody fusion utilizing interbody cage, local harvested autograft, and anterior plate instrumentation  Surgeon:Ulrich Soules A.Lavender Stanke, M.D.  Asst. Surgeon: Doran Durand, NP  Anesthesia: General  Indication: 66 year old female with persistent neck and left upper extremity pain numbness and some degree of weakness consistent with a left-sided C6 radiculopathy which is failed conservative management.  Work-up demonstrates evidence of significant cervical disc generation with associated spondylosis and severe foraminal stenosis left greater than right.  Patient presents now for C5-6 anterior cervical discectomy and fusion in hopes of improving her symptoms.  Operative note: After induction of anesthesia, patient positioned supine with neck slightly extended and held in place with halter traction.  Patient's anterior cervical region prepped draped sterilely.  Incision made overlying C5-6.  Dissection performed in the right.  Retractor placed.  Fluoroscopy used.  Levels confirmed.  Disc space then incised.  Discectomy then performed using various instruments down to level the posterior annulus.  Microscope was brought in field used throughout the remainder of the discectomy remaining aspects of annulus and osteophytes removed using high-speed drill down to level the posterior logical limit.  Posterior ligament was then elevated and resected piecemeal fashion. The thecal sac was then identified.  Wide central decompression then performed undercutting the bodies of C5 and C6.  Decompression then proceeded each neural foramina.  Wide anterior foraminotomies completed along the course exiting C6 nerve roots bilaterally.  This point a very thorough decompression of been  achieved.  There was no evidence of injury to the thecal sac and nerve roots.  Wound was then irrigated.  Gelfoam was placed topically for hemostasis then removed.  6 mm Medtronic anatomic peek cage was then packed with locally harvested autograft.  This then impacted into place and recessed slightly from the anterior cortical margin.  23 mm Atlantis anterior cervical plate was then placed over the C5 and C6 levels.  This then attached under fluoroscopic guidance using 13 mm variable angle screws to each of both levels.  All screws given final tightening found to be solidly within the bone.  Locking screws engaged both levels.  Final images reveal good position of the cage and the hardware at the proper operative level with normal alignment of spine.  Wound was then irrigated.  Hemostasis was assured.  Wound was then closed in layers with Vicryl sutures.  Steri-Strips and sterile dressing were applied.  No apparent complications.  Patient tolerated the procedure well and she returns to recovery room postop.

## 2022-09-10 NOTE — Progress Notes (Signed)
Orthopedic Tech Progress Note Patient Details:  Alyssa [name] [birth year] [id number]  RN stated patient has on soft collar   Patient ID: [name], female   DOB: Jan 23, 1956, 66 y.o.   MRN: [id number]  [name] 09/10/2022, 7:15 PM

## 2022-09-10 NOTE — Anesthesia Procedure Notes (Signed)
Procedure Name: Intubation Date/Time: 09/10/2022 1:43 PM  Performed by: Drema Pry, CRNAPre-anesthesia Checklist: Patient identified, Emergency Drugs available, Suction available and Patient being monitored Patient Re-evaluated:Patient Re-evaluated prior to induction Oxygen Delivery Method: Circle System Utilized Preoxygenation: Pre-oxygenation with 100% oxygen Induction Type: IV induction Ventilation: Mask ventilation without difficulty Laryngoscope Size: Glidescope and 3 Grade View: Grade I Tube type: Oral Tube size: 7.0 mm Number of attempts: 1 Airway Equipment and Method: Stylet and Oral airway Placement Confirmation: ETT inserted through vocal cords under direct vision, positive ETCO2 and breath sounds checked- equal and bilateral Secured at: 19 cm Tube secured with: Tape Dental Injury: Teeth and Oropharynx as per pre-operative assessment  Comments: Elective glidescope d/t cervical neuropathies

## 2022-09-10 NOTE — Anesthesia Preprocedure Evaluation (Addendum)
Anesthesia Evaluation  Patient identified by MRN, date of birth, ID band Patient awake    Reviewed: Allergy & Precautions, NPO status , Patient's Chart, lab work & pertinent test results  Airway Mallampati: III  TM Distance: >3 FB Neck ROM: Full    Dental  (+) Edentulous Upper   Pulmonary COPD,  COPD inhaler, Current Smoker and Patient abstained from smoking.   Pulmonary exam normal        Cardiovascular negative cardio ROS Normal cardiovascular exam     Neuro/Psych  Headaches  Neuromuscular disease    GI/Hepatic negative GI ROS,,,(+)     substance abuse    Endo/Other  negative endocrine ROS    Renal/GU negative Renal ROS     Musculoskeletal  (+) Arthritis ,  narcotic dependentChronic pain   Abdominal   Peds  Hematology negative hematology ROS (+)   Anesthesia Other Findings Cervical Stenosis  Reproductive/Obstetrics                             Anesthesia Physical Anesthesia Plan  ASA: 3  Anesthesia Plan: General   Post-op Pain Management:    Induction: Intravenous  PONV Risk Score and Plan: 2 and Ondansetron, Dexamethasone, Midazolam and Treatment may vary due to age or medical condition  Airway Management Planned: Oral ETT and Video Laryngoscope Planned  Additional Equipment:   Intra-op Plan:   Post-operative Plan: Extubation in OR  Informed Consent: I have reviewed the patients History and Physical, chart, labs and discussed the procedure including the risks, benefits and alternatives for the proposed anesthesia with the patient or authorized representative who has indicated his/her understanding and acceptance.     Dental advisory given  Plan Discussed with: CRNA  Anesthesia Plan Comments: (PAT note written 09/10/2022 by Shonna Chock, PA-C. )        Anesthesia Quick Evaluation

## 2022-09-10 NOTE — Brief Op Note (Signed)
09/10/2022  2:55 PM  PATIENT:  Alyssa [name]  66 y.o. female  PRE-OPERATIVE DIAGNOSIS:  Stenosis  POST-OPERATIVE DIAGNOSIS:  Stenosis  PROCEDURE:  Procedure(s): Anterior Cervical Decompression Fusion - Cervical five-Cervical six (N/A)  SURGEON:  Surgeon(s) and Role:    * Julio Sicks, MD - Primary  PHYSICIAN ASSISTANT:   ASSISTANTS: Bergman<NP   ANESTHESIA:   general  EBL:  100 mL   BLOOD ADMINISTERED:none  DRAINS: none   LOCAL MEDICATIONS USED:  MARCAINE     SPECIMEN:  No Specimen  DISPOSITION OF SPECIMEN:  N/A  COUNTS:  YES  TOURNIQUET:  * No tourniquets in log *  DICTATION: .Dragon Dictation  PLAN OF CARE: Admit for overnight observation  PATIENT DISPOSITION:  PACU - hemodynamically stable.   Delay start of Pharmacological VTE agent (>24hrs) due to surgical blood loss or risk of bleeding: yes

## 2022-09-11 ENCOUNTER — Encounter (HOSPITAL_COMMUNITY): Payer: Self-pay | Admitting: Neurosurgery

## 2022-09-11 DIAGNOSIS — M4722 Other spondylosis with radiculopathy, cervical region: Secondary | ICD-10-CM | POA: Diagnosis not present

## 2022-09-11 MED ORDER — METHOCARBAMOL 750 MG PO TABS: 750.0000 mg | ORAL_TABLET | Freq: Three times a day (TID) | ORAL | 0 refills | Status: AC

## 2022-09-11 MED ORDER — OXYCODONE-ACETAMINOPHEN 5-325 MG PO TABS: 1.0000 | ORAL_TABLET | ORAL | 0 refills | Status: DC | PRN

## 2022-09-11 MED FILL — Thrombin (Recombinant) For Soln 5000 Unit: CUTANEOUS | Qty: 2 | Status: AC

## 2022-09-11 NOTE — Evaluation (Signed)
Occupational Therapy Evaluation Patient Details Name: Alyssa [name] MRN: [id number] DOB: [birth year] Today's Date: 09/11/2022   History of Present Illness 66 yo female s/p ACDF C5-6 on 9/11. PMH including COPD, back sx, L heart cath, and Bright's disease (resolved).   Clinical Impression   PTA, pt was living with her husband and daughter (with disabilities) and was independent. Currently, pt requires Mod I with increased time for ADLs and functional mobility using RW. Provided education and handout on cervical precautions, bed mobility, collar management, grooming, UB ADLs, LB ADLs, toileting, and shower transfer; pt demonstrated understanding. Answered all pt questions. Recommend dc home once medically stable per physician. All acute OT needs met and will sign off. Thank you.    Recommendations for follow up therapy are one component of a multi-disciplinary discharge planning process, led by the attending physician.  Recommendations may be updated based on patient status, additional functional criteria and insurance authorization.   Follow Up Recommendations  No OT follow up    Assistance Recommended at Discharge PRN  Patient can return home with the following      Functional Status Assessment  Patient has had a recent decline in their functional status and demonstrates the ability to make significant improvements in function in a reasonable and predictable amount of time.  Equipment Recommendations  None recommended by OT    Recommendations for Other Services       Precautions / Restrictions Precautions Precautions: Cervical Precaution Booklet Issued: Yes (comment) Precaution Comments: Reviewed cervical precautions Required Braces or Orthoses: Cervical Brace Cervical Brace: Soft collar;At all times Restrictions Weight Bearing Restrictions: No      Mobility Bed Mobility Overal bed mobility: Modified Independent             General bed mobility comments: log roll     Transfers Overall transfer level: Independent                        Balance Overall balance assessment: No apparent balance deficits (not formally assessed)                                         ADL either performed or assessed with clinical judgement   ADL Overall ADL's : Modified independent                                       General ADL Comments: Providing education on cervical precautions, UB ADLs, grooming, LB ADLs, toileting, brace management, and shower trasnfer.     Vision         Perception     Praxis      Pertinent Vitals/Pain Pain Assessment Pain Assessment: Faces Faces Pain Scale: Hurts little more Pain Location: neck Pain Descriptors / Indicators: Sore Pain Intervention(s): Monitored during session, Repositioned, Premedicated before session     Hand Dominance     Extremity/Trunk Assessment Upper Extremity Assessment Upper Extremity Assessment: Overall WFL for tasks assessed   Lower Extremity Assessment Lower Extremity Assessment: Overall WFL for tasks assessed   Cervical / Trunk Assessment Cervical / Trunk Assessment: Neck Surgery   Communication Communication Communication: No difficulties   Cognition Arousal/Alertness: Awake/alert Behavior During Therapy: WFL for tasks assessed/performed Overall Cognitive Status: Within Functional Limits for tasks assessed  General Comments       Exercises     Shoulder Instructions      Home Living Family/patient expects to be discharged to:: Private residence Living Arrangements: Spouse/significant other Available Help at Discharge: Family;Available 24 hours/day Type of Home: House Home Access: Stairs to enter;Ramped entrance Entrance Stairs-Number of Steps: 2 at front   Home Layout: One level     Bathroom Shower/Tub: Occupational psychologist: Handicapped height     Home  Equipment: [place] - single point;Tub bench;Hand held shower head   Additional Comments: Sleep on mattress on floor. Discussed sleeping in recliner for elevation      Prior Functioning/Environment Prior Level of Function : Independent/Modified Independent             Mobility Comments: No DME ADLs Comments: Independent wiht ADLs and IADLs. 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        OT Problem List: Decreased strength;Decreased range of motion;Decreased activity tolerance;Impaired balance (sitting and/or standing);Decreased knowledge of use of DME or AE;Decreased knowledge of precautions      OT Treatment/Interventions:      OT Goals(Current goals can be found in the care plan section) Acute Rehab OT Goals Patient Stated Goal: Go home OT Goal Formulation: All assessment and education complete, DC therapy  OT Frequency:      Co-evaluation              AM-PAC OT "6 Clicks" Daily Activity     Outcome Measure Help from another person eating meals?: None Help from another person taking care of personal grooming?: None Help from another person toileting, which includes using toliet, bedpan, or urinal?: None Help from another person bathing (including washing, rinsing, drying)?: None Help from another person to put on and taking off regular upper body clothing?: None Help from another person to put on and taking off regular lower body clothing?: None 6 Click Score: 24   End of Session Equipment Utilized During Treatment: Cervical collar Nurse Communication: Mobility status  Activity Tolerance: Patient tolerated treatment well Patient left: in bed;with call bell/phone within reach  OT Visit Diagnosis: Unsteadiness on feet (R26.81);Other abnormalities of gait and mobility (R26.89);Muscle weakness (generalized) (M62.81)                Time: 6387-5643 OT Time Calculation (min): 22 min Charges:  OT General Charges $OT Visit: 1 Visit OT Evaluation $OT  Eval Low Complexity: 1 Low  Donell Tomkins MSOT, OTR/L Acute Rehab Office: [place] 09/11/2022, 9:06 AM

## 2022-09-11 NOTE — Discharge Instructions (Addendum)
Wound Care Keep incision covered and dry for three days.  Do not put any creams, lotions, or ointments on incision. Leave steri-strips on neck.  They will fall off by themselves. Activity Walk each and every day, increasing distance each day. No lifting greater than 5 lbs.  Avoid excessive neck motion. No driving for 2 weeks; may ride as a passenger locally.  Diet Resume your normal diet.   Call Your Doctor If Any of These Occur Redness, drainage, or swelling at the wound.  Temperature greater than 101 degrees. Severe pain not relieved by pain medication. Incision starts to come apart. Follow Up Appt Call  [phone])  for problems.  If you have any hardware placed in your spine, you will need an x-ray before your appointment.

## 2022-09-11 NOTE — Discharge Summary (Signed)
Physician Discharge Summary  Patient ID: Alyssa [name] MRN: [id number] DOB/AGE: Mar 02, 1956 66 y.o.  Admit date: 09/10/2022 Discharge date: 09/11/2022  Admission Diagnoses:  Discharge Diagnoses:  Principal Problem:   Cervical radiculopathy   Discharged Condition: good  Hospital Course: Patient admitted to the hospital where she underwent uncomplicated anterior cervical decompression and fusion.  Postoperatively doing well.  Preoperative neck and upper extremity symptoms improved.  Swallowing well.  Voice strong.  Ambulating without difficulty.  Ready for discharge home.  Consults:   Significant Diagnostic Studies:   Treatments:   Discharge Exam: Blood pressure 115/69, pulse 66, temperature 98.2 F (36.8 C), temperature source Oral, resp. rate 16, height 5\' 5"  (1.651 m), weight 76.2 kg, SpO2 97 %. Awake and alert.  Oriented and appropriate.  Motor and sensory function intact.  Wound clean and dry.  Neck soft.  Voice strong. Disposition: Discharge disposition: 01-Home or Self Care        Allergies as of 09/11/2022       Reactions   Bee Venom Anaphylaxis   Arthrotec [diclofenac-misoprostol] Other (See Comments)   Not sure   Codeine Itching   headache   Cymbalta [duloxetine Hcl] Other (See Comments)   "felt I was sinking into the floor"   Effexor [venlafaxine Hydrochloride] Other (See Comments)   confusion   Escitalopram Oxalate Other (See Comments)   confusion   Hydrocodone Other (See Comments)   Severe headache, itching   Neurontin [gabapentin] Other (See Comments)   Not sure   Oxymorphone Hcl Other (See Comments)   "It didn't work"   Pentazocine Lactate Other (See Comments)   Not sure   Provigil [modafinil] Other (See Comments)   Not sure   Sertraline Hcl Other (See Comments)   More depressed        Medication List     TAKE these medications    acetaminophen 500 MG tablet Commonly known as: TYLENOL Take 1 tablet (500 mg total) by mouth every 6  (six) hours as needed (pain). What changed: reasons to take this   albuterol 108 (90 Base) MCG/ACT inhaler Commonly known as: VENTOLIN HFA Inhale 2 puffs into the lungs every 6 (six) hours as needed for wheezing or shortness of breath.   ALPRAZolam 1 MG tablet Commonly known as: XANAX Take 1 mg by mouth 3 (three) times daily.   ascorbic acid 500 MG tablet Commonly known as: VITAMIN C Take 500 mg by mouth daily.   aspirin EC 81 MG tablet Take 81 mg by mouth at bedtime. Swallow whole.   atorvastatin 20 MG tablet Commonly known as: LIPITOR TAKE 1 TABLET BY MOUTH EVERY DAY   D3 Maximum Strength 125 MCG (5000 UT) capsule Generic drug: Cholecalciferol Take 5,000 Units by mouth daily.   estradiol 0.1 MG/GM vaginal cream Commonly known as: ESTRACE Place 0.5 g vaginally 2 (two) times a week. Place 0.5g nightly for two weeks then twice a week after   fluticasone 50 MCG/ACT nasal spray Commonly known as: FLONASE SPRAY 2 SPRAYS INTO EACH NOSTRIL EVERY DAY   HYDROcodone-acetaminophen 5-325 MG tablet Commonly known as: NORCO/VICODIN Take 1 tablet by mouth every 6 (six) hours as needed for pain.   ibuprofen 600 MG tablet Commonly known as: ADVIL Take 1 tablet (600 mg total) by mouth every 6 (six) hours as needed. What changed: reasons to take this   methocarbamol 750 MG tablet Commonly known as: ROBAXIN Take 1 tablet (750 mg total) by mouth 3 (three) times daily.   oxyCODONE-acetaminophen 5-325  MG tablet Commonly known as: PERCOCET/ROXICET Take 1-2 tablets by mouth every 4 (four) hours as needed. What changed:  how much to take when to take this reasons to take this   polyethylene glycol powder 17 GM/SCOOP powder Commonly known as: GLYCOLAX/MIRALAX Take 17 g by mouth daily. Drink 17g (1 scoop) dissolved in water per day. What changed: additional instructions   pregabalin 100 MG capsule Commonly known as: LYRICA Take 100 mg by mouth 3 (three) times daily.   traMADol 50  MG tablet Commonly known as: ULTRAM Take 100 mg by mouth in the morning, at noon, and at bedtime.         Signed: Kathaleen Maser Alyssa [name] 09/11/2022, 8:32 AM

## 2022-09-11 NOTE — Anesthesia Postprocedure Evaluation (Signed)
Anesthesia Post Note  Patient: Alyssa [name]  Procedure(s) Performed: Anterior Cervical Decompression Fusion - Cervical five-Cervical six (Spine Cervical)     Patient location during evaluation: PACU Anesthesia Type: General Level of consciousness: awake Pain management: pain level controlled Vital Signs Assessment: post-procedure vital signs reviewed and stable Respiratory status: spontaneous breathing, nonlabored ventilation, respiratory function stable and patient connected to nasal cannula oxygen Cardiovascular status: blood pressure returned to baseline and stable Postop Assessment: no apparent nausea or vomiting Anesthetic complications: no   No notable events documented.  Last Vitals:  Vitals:   09/10/22 2304 09/11/22 0410  BP: (!) 144/79 102/74  Pulse: 100 75  Resp: 18 18  Temp: 36.7 C 36.7 C  SpO2: 96% 97%    Last Pain:  Vitals:   09/11/22 0508  TempSrc:   PainSc: 7                  Alyssa [name]

## 2022-09-11 NOTE — Progress Notes (Signed)
Patient alert and oriented, mae's well, voiding adequate amount of urine, swallowing without difficulty, no c/o pain at time of discharge. Patient discharged home with family. Script and discharged instructions given to patient. Patient and family stated understanding of instructions given. Patient has an appointment with Dr. Pool in 2 weeks 

## 2022-10-16 IMAGING — CR DG CHEST 2V
2 series · 2 of 2 positions shown · non-contrast
Comparison: Chest CT 02/06/2022

CLINICAL DATA: Hemoptysis

EXAM:
CHEST - 2 VIEW

[chest ap]
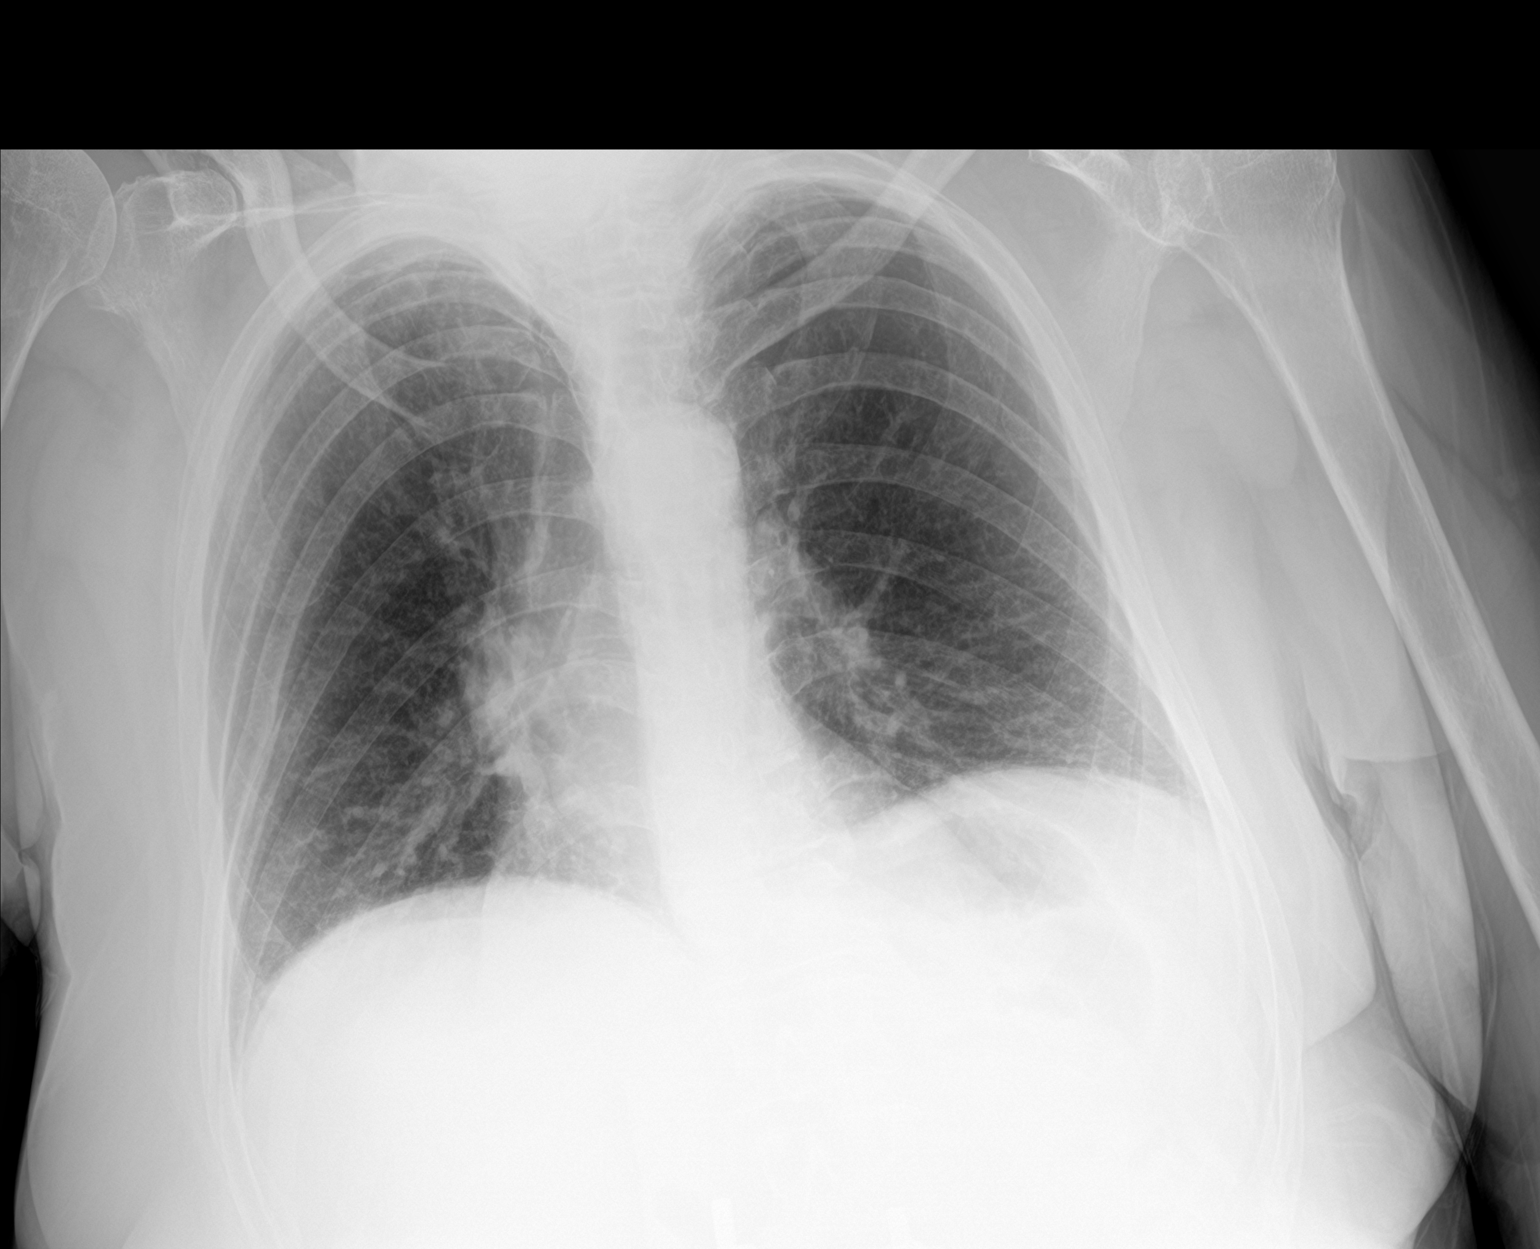

[chest lat]
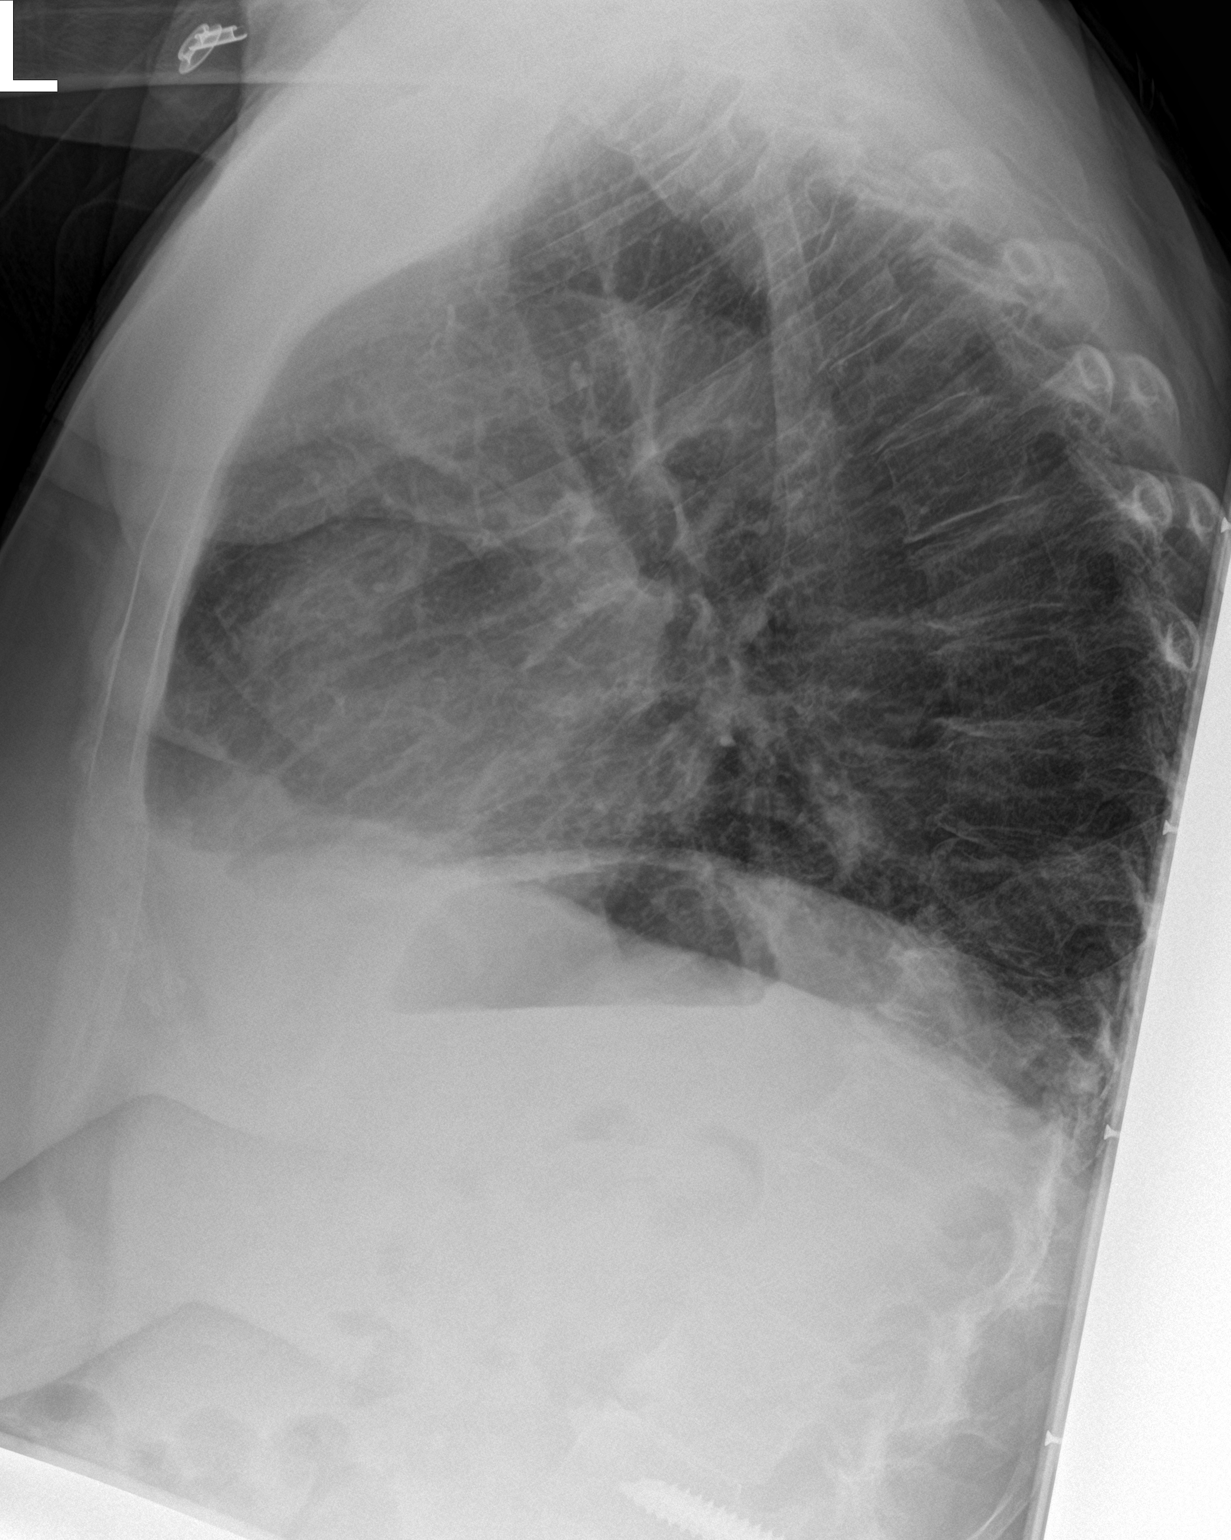

[2 of 2 positions shown; findings below may reference images not displayed]

FINDINGS: The heart size and mediastinal contours are within normal limits.
Both lungs are clear. The visualized skeletal structures are
unremarkable.
IMPRESSION: No active cardiopulmonary disease.

## 2022-11-27 ENCOUNTER — Telehealth: Payer: Self-pay | Admitting: Emergency Medicine

## 2022-11-27 NOTE — Telephone Encounter (Signed)
spoke w pt inregards to scheduling 6 mo f/u appt, pt states that at [facility] Dr. Delton Coombes stated that she would have a scan done before seeing him again. So she's not sure about 72mo appt. I advised pt that I was going off of AVS of LOV. pt stated that ct scan is scheduled for 06/2023. Pt asked if CT scan could be done before 6 mo f/u appt is scheduled. I advise pt that I will send message over to [facility] & nurse will reach out.  [phone]  Also, pt also questioned why CT scan has been scheduled at [facility] if she normally come to [facility], pt prefers to do scan in [place].

## 2022-11-28 NOTE — Telephone Encounter (Signed)
Called and spoke with pt about why the CT is for June 2024 and also stated to her that RB wanted her to follow up with him 6 months from last OV. Pt verbalized understanding. Appt scheduled for pt with RB. Nothing further needed.

## 2023-01-15 ENCOUNTER — Ambulatory Visit: Payer: Medicare Other | Admitting: Emergency Medicine

## 2023-01-15 ENCOUNTER — Encounter: Payer: Self-pay | Admitting: Emergency Medicine

## 2023-01-15 VITALS — BP 116/64 | HR 79 | Ht 65.0 in | Wt 166.4 lb

## 2023-01-15 DIAGNOSIS — Z720 Tobacco use: Secondary | ICD-10-CM | POA: Diagnosis not present

## 2023-01-15 DIAGNOSIS — J449 Chronic obstructive pulmonary disease, unspecified: Secondary | ICD-10-CM

## 2023-01-15 DIAGNOSIS — R918 Other nonspecific abnormal finding of lung field: Secondary | ICD-10-CM

## 2023-01-15 MED ORDER — SPIRIVA RESPIMAT 2.5 MCG/ACT IN AERS: 2.0000 | INHALATION_SPRAY | Freq: Every day | RESPIRATORY_TRACT | 0 refills | Status: DC

## 2023-01-15 NOTE — Assessment & Plan Note (Signed)
Continues to smoke.  Contemplative phase with regard to cutting down.  She is not ready to try to stop.

## 2023-01-15 NOTE — Assessment & Plan Note (Signed)
Starting to have exertional dyspnea with certain tasks.  I think she will likely benefit from BD therapy.  She is concerned about cost.  We can start with Spiriva Respimat to see if she gets benefit.  We will obtain pulmonary function testing to quantify her degree of obstruction.  She does need to stop smoking.  Discussed this with her today

## 2023-01-15 NOTE — Assessment & Plan Note (Signed)
Most concerning nodule was resolved on her CT 05/2022.  She needs a repeat scan 06/2023.  Follow-up to review after.

## 2023-01-15 NOTE — Addendum Note (Signed)
Addended by: Lorretta Harp on: 01/15/2023 03:22 PM   Modules accepted: Orders

## 2023-01-15 NOTE — Progress Notes (Signed)
Subjective:    Patient ID: Alyssa [name], female    DOB: March 31, 1956, 66 y.o.   MRN: [id number]  HPI  ROV 06/26/22 --follow-up visit 67 year old active smoker.  She had a lung cancer screening CT 01/2022 that showed emphysematous change, bilateral upper lobe groundglass opacities, numerous irregular solid left lower lobe pulmonary nodules including a 12 mm part solid left lower lobe nodule with a 7 mm solid component.  We repeated the CT scan of the chest 06/20/2022 as below. She has cut down her cigarettes to 15/day. Minimal breathing sx, does have some exertional SOB.   CT scan of the chest 06/20/2022 reviewed by me, shows resolution of her patchy ill-defined groundglass opacities, resolution of the part solid peripheral left lower lobe nodule, overall stability of her other groundglass left lower lobe nodules measuring up to 8 mm.  ROV 01/15/23 --67 year old woman with a history of tobacco use who returns today for follow-up of abnormal CT scan of the chest.  Most concerning nodule in her left lower lobe was resolved on her most recent scan 05/2022.  Planning to repeat later this year 06/2023.  Overall she has been breathing ok, but she does have SOB when she is doing housework, showering. She uses albuterol very rarely. She coughs at night, clear mucous. She is still smoking 10-15 cig a day.    Review of Systems As per HPI  Past Medical History:  Diagnosis Date   Bright's disease age 15   issue resolved   Chronic pain    COPD (chronic obstructive pulmonary disease) (HCC)    Headache    Marijuana abuse    Nerve damage    to back and legs   Primary localized osteoarthrosis, lower leg    oa   Pulmonary nodules    Tobacco abuse    Wears dentures top   Wears glasses      Family History  Problem Relation Age of Onset   Stroke Mother    Hypertension Father    Breast cancer Paternal Aunt      Social History   Socioeconomic History   Marital status: Married    Spouse name: Not on  file   Number of children: Not on file   Years of education: Not on file   Highest education level: Not on file  Occupational History   Not on file  Tobacco Use   Smoking status: Every Day    Packs/day: 0.50    Years: 40.00    Total pack years: 20.00    Types: Cigarettes   Smokeless tobacco: Never   Tobacco comments:    10-15cigs per day as of 01/15/23 ep  Vaping Use   Vaping Use: Never used  Substance and Sexual Activity   Alcohol use: No   Drug use: No   Sexual activity: Not Currently  Other Topics Concern   Not on file  Social History Narrative   Not on file   Social Determinants of Health   Financial Resource Strain: Not on file  Food Insecurity: Not on file  Transportation Needs: Not on file  Physical Activity: Not on file  Stress: Not on file  Social Connections: Not on file  Intimate Partner Violence: Not on file    Has worked as a caregiver and housewife From [place] No military No TB exposure.   Allergies  Allergen Reactions   Bee Venom Anaphylaxis   Arthrotec [Diclofenac-Misoprostol] Other (See Comments)    Not sure   Codeine Itching  headache   Cymbalta [Duloxetine Hcl] Other (See Comments)    "felt I was sinking into the floor"   Effexor [Venlafaxine Hydrochloride] Other (See Comments)    confusion   Escitalopram Oxalate Other (See Comments)    confusion   Hydrocodone Other (See Comments)    Severe headache, itching   Neurontin [Gabapentin] Other (See Comments)    Not sure   Oxymorphone Hcl Other (See Comments)    "It didn't work"   Pentazocine Lactate Other (See Comments)    Not sure   Provigil [Modafinil] Other (See Comments)    Not sure   Sertraline Hcl Other (See Comments)    More depressed     Outpatient Medications Prior to Visit  Medication Sig Dispense Refill   acetaminophen (TYLENOL) 500 MG tablet Take 1 tablet (500 mg total) by mouth every 6 (six) hours as needed (pain). (Patient taking differently: Take 500 mg by mouth every 6  (six) hours as needed for headache (pain).) 30 tablet 0   albuterol (VENTOLIN HFA) 108 (90 Base) MCG/ACT inhaler Inhale 2 puffs into the lungs every 6 (six) hours as needed for wheezing or shortness of breath. 8 g 6   ALPRAZolam (XANAX) 1 MG tablet Take 1 mg by mouth 3 (three) times daily.     ascorbic acid (VITAMIN C) 500 MG tablet Take 500 mg by mouth daily.     aspirin EC 81 MG tablet Take 81 mg by mouth at bedtime. Swallow whole.     Cholecalciferol (D3 MAXIMUM STRENGTH) 125 MCG (5000 UT) capsule Take 5,000 Units by mouth daily.     methocarbamol (ROBAXIN) 750 MG tablet Take 1 tablet (750 mg total) by mouth 3 (three) times daily. 30 tablet 0   polyethylene glycol powder (GLYCOLAX/MIRALAX) 17 GM/SCOOP powder Take 17 g by mouth daily. Drink 17g (1 scoop) dissolved in water per day. (Patient taking differently: Take 17 g by mouth daily.) 255 g 0   pregabalin (LYRICA) 100 MG capsule Take 100 mg by mouth 3 (three) times daily.     traMADol (ULTRAM) 50 MG tablet Take 100 mg by mouth in the morning, at noon, and at bedtime.     atorvastatin (LIPITOR) 20 MG tablet TAKE 1 TABLET BY MOUTH EVERY DAY (Patient not taking: Reported on 09/05/2022) 90 tablet 0   estradiol (ESTRACE) 0.1 MG/GM vaginal cream Place 0.5 g vaginally 2 (two) times a week. Place 0.5g nightly for two weeks then twice a week after (Patient not taking: Reported on 09/05/2022) 30 g 11   fluticasone (FLONASE) 50 MCG/ACT nasal spray SPRAY 2 SPRAYS INTO EACH NOSTRIL EVERY DAY (Patient not taking: Reported on 09/05/2022) 48 mL 3   HYDROcodone-acetaminophen (NORCO/VICODIN) 5-325 MG tablet Take 1 tablet by mouth every 6 (six) hours as needed for pain.     ibuprofen (ADVIL) 600 MG tablet Take 1 tablet (600 mg total) by mouth every 6 (six) hours as needed. (Patient taking differently: Take 600 mg by mouth every 6 (six) hours as needed for mild pain.) 30 tablet 0   oxyCODONE-acetaminophen (PERCOCET/ROXICET) 5-325 MG tablet Take 1-2 tablets by mouth every 4  (four) hours as needed. 40 tablet 0   No facility-administered medications prior to visit.          Objective:   Physical Exam Vitals:   01/15/23 1449  BP: 116/64  Pulse: 79  SpO2: 98%  Weight: 166 lb 6.4 oz (75.5 kg)  Height: 5\' 5"  (1.651 m)    Gen: Pleasant, well-nourished, in  no distress,  normal affect  ENT: No lesions,  mouth clear,  oropharynx clear, no postnasal drip  Neck: No JVD, no stridor  Lungs: No use of accessory muscles, no crackles or wheezing on normal respiration, no wheeze on forced expiration  Cardiovascular: RRR, heart sounds normal, no murmur or gallops, no peripheral edema  Musculoskeletal: No deformities, no cyanosis or clubbing  Neuro: alert, awake, non focal  Skin: Warm, no lesions or rash      Assessment & Plan:  COPD (chronic obstructive pulmonary disease) ([place]) Starting to have exertional dyspnea with certain tasks.  I think she will likely benefit from BD therapy.  She is concerned about cost.  We can start with Spiriva Respimat to see if she gets benefit.  We will obtain pulmonary function testing to quantify her degree of obstruction.  She does need to stop smoking.  Discussed this with her today  Pulmonary nodules Most concerning nodule was resolved on her CT 05/2022.  She needs a repeat scan 06/2023.  Follow-up to review after.  Tobacco use Continues to smoke.  Contemplative phase with regard to cutting down.  She is not ready to try to stop.   Baltazar Apo, MD, PhD 01/15/2023, 3:09 PM [facility] [phone] or if no answer before 7:00PM call [phone] For any issues after 7:00PM please call eLink [phone]

## 2023-01-15 NOTE — Patient Instructions (Signed)
Will arrange for pulmonary function testing in next office visit Please try Spiriva Respimat, 2 puffs once daily.  Take this every day on a schedule.  Keep track of whether the medication helps your breathing and your functional capacity.  If so then we will probably continue it going forward. Keep albuterol available to use 2 puffs if needed for shortness of breath, chest tightness, wheezing. We will plan to repeat your CT scan of the chest in June 2024 You need to decrease your cigarettes.  Ultimate goal would be to stop altogether. Follow Dr. Lamonte Sakai next available with PFT on the same day

## 2023-01-28 IMAGING — CT CT CHEST SUPER D W/O CM
2 of 5 series · 14 of 36 positions shown, 17 images · non-contrast
Comparison: Chest CT 02/06/2022 and 01/14/2008

CLINICAL DATA: Follow up pulmonary nodules. History of smoking and
COPD. No history of malignancy.

EXAM:
CT CHEST WITHOUT CONTRAST
TECHNIQUE: Multidetector CT imaging of the chest was performed using thin slice
collimation for electromagnetic bronchoscopy planning purposes,
without intravenous contrast.
RADIATION DOSE REDUCTION: This exam was performed according to the
departmental dose-optimization program which includes automated
exposure control, adjustment of the mA and/or kV according to
patient size and/or use of iterative reconstruction technique.

[Series 4: chest 2.00 br40 s3 · coronal · 0.56mm/px · 3 of 155 slices shown]
[im 31/155  lung]
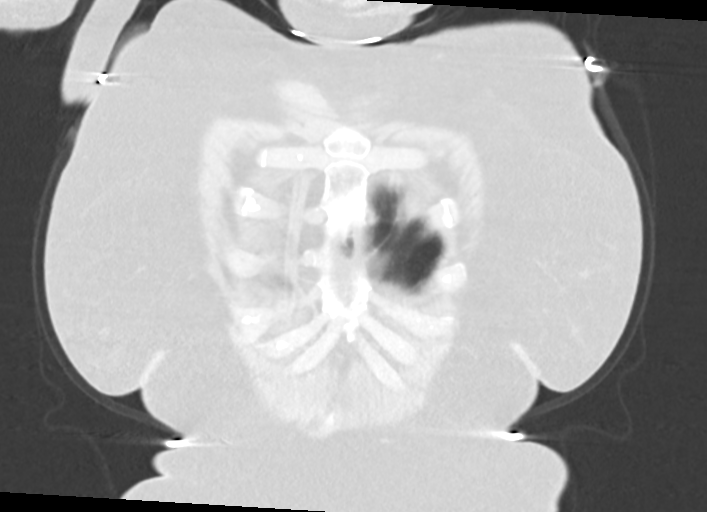
[im 62/155  lung]
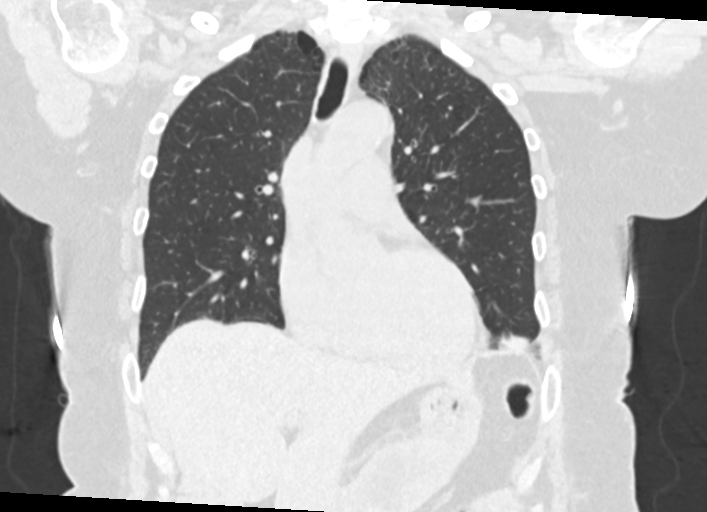
[im 93/155  lung]
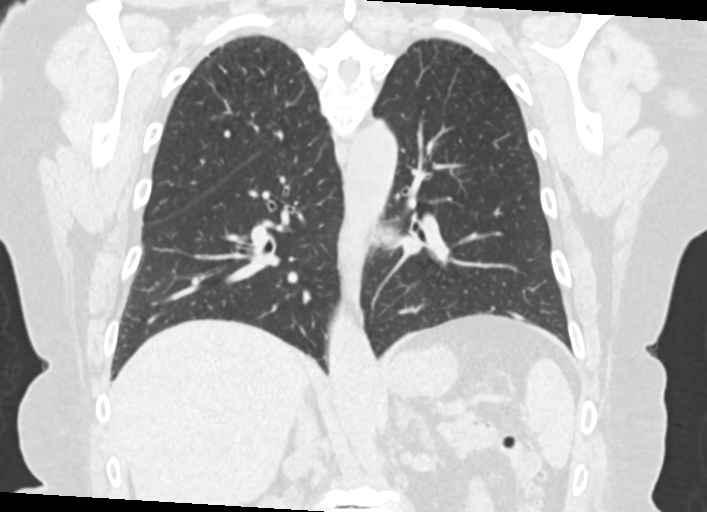

[Series 10: chest 1.00 br40 s3 super d · axial · 0.78mm/px · z∈[+1334,+1581]mm · 11 of 357 slices shown, 14 images]
[im 24/357  mediastinal]
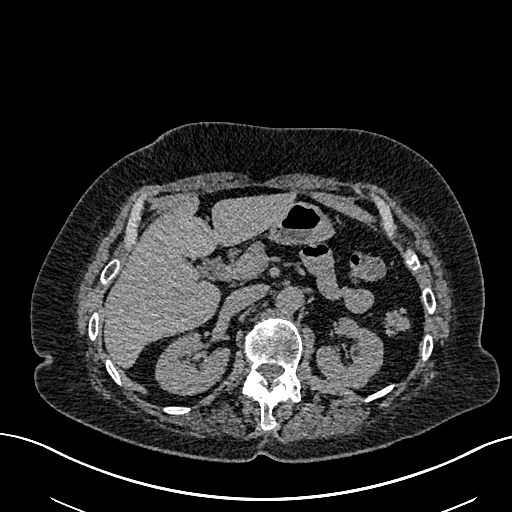
[im 24/357  lung]
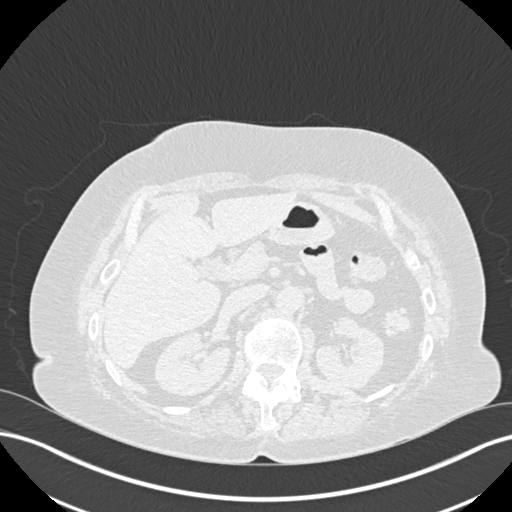
[im 48/357  lung]
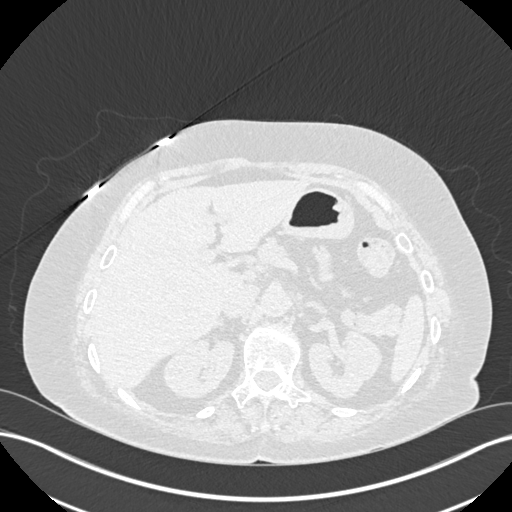
[im 95/357  lung]
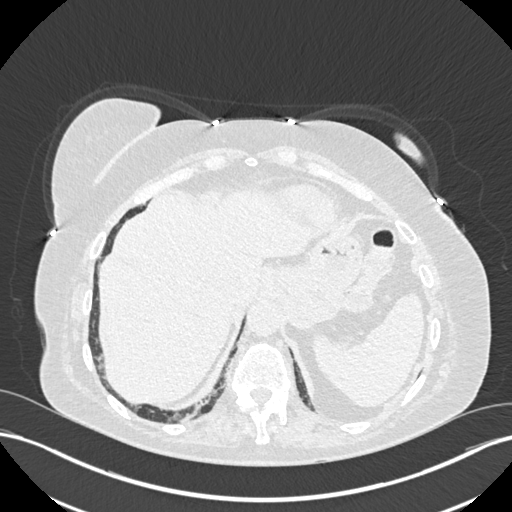
[im 119/357  lung]
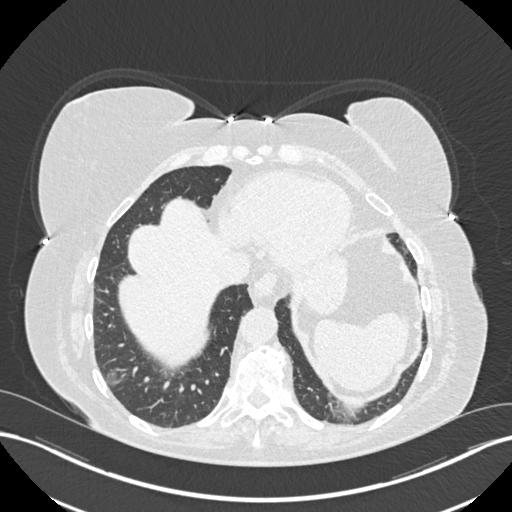
[im 143/357  mediastinal]
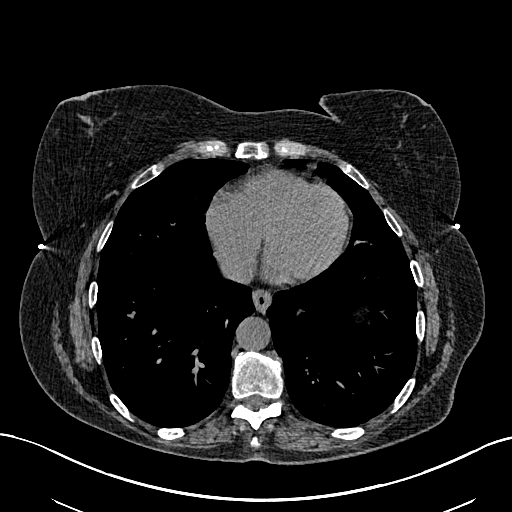
[im 143/357  lung]
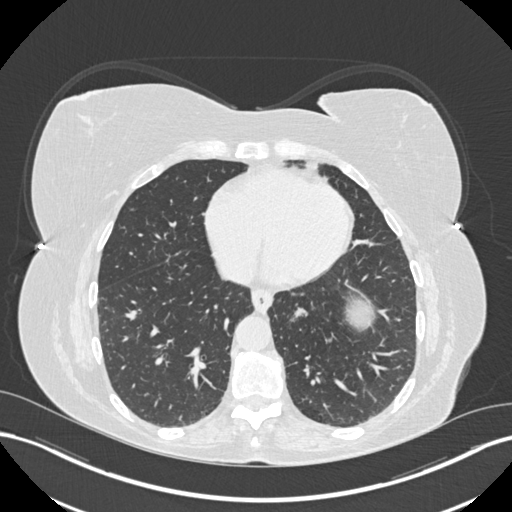
[im 190/357  lung]
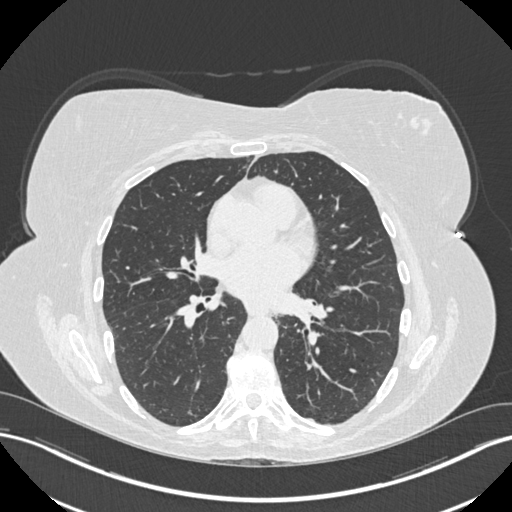
[im 214/357  lung]
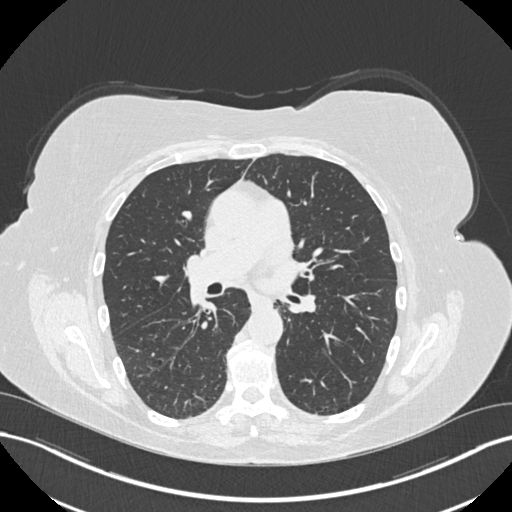
[im 238/357  lung]
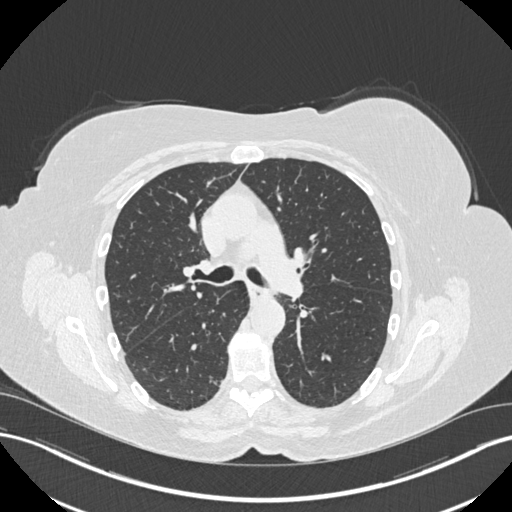
[im 262/357  mediastinal]
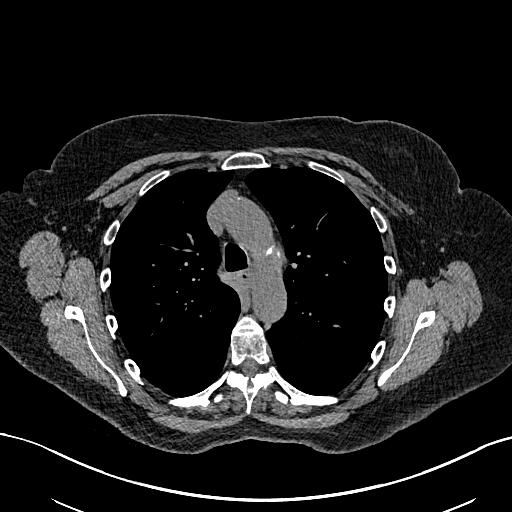
[im 262/357  lung]
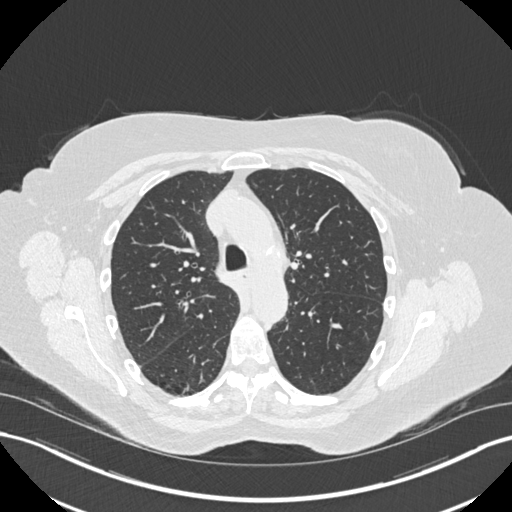
[im 309/357  lung]
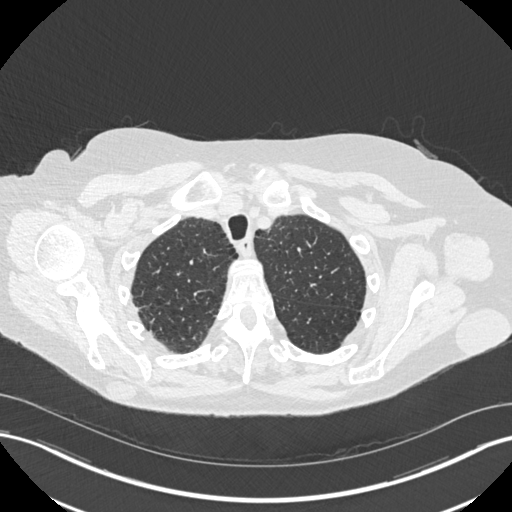
[im 333/357  lung]
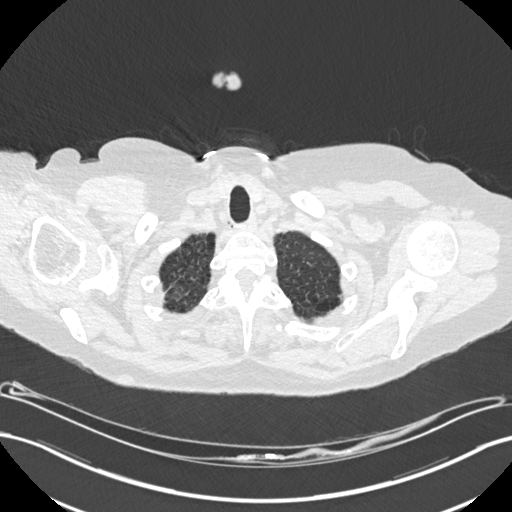

[14 of 36 positions shown; findings below may reference images not displayed]

FINDINGS: Cardiovascular: Relatively mild atherosclerosis of the aorta, great
vessels and coronary arteries. No acute vascular findings on
noncontrast imaging. The heart size is normal. There is no
pericardial effusion.

Mediastinum/Nodes: There are no enlarged mediastinal, hilar or
axillary lymph nodes.Hilar assessment is limited by the lack of
intravenous contrast, although the hilar contours appear unchanged.
Mild distal esophageal wall thickening without significant hiatal
hernia. The thyroid gland and trachea appear unremarkable.

Lungs/Pleura: No pleural effusion or pneumothorax. Mild
centrilobular and paraseptal emphysema. The patchy ill-defined
ground-glass opacities in the upper lobes have improved. Several
small clustered solid nodules medially in the left lower lobe have
not significantly changed, measuring up to 0.8 x 0.5 cm on image
87/8. The part solid nodule previously demonstrated more
peripherally in the left lower lobe has resolved. No new or
enlarging nodules are identified.

Upper abdomen: The visualized upper abdomen appears stable without
significant findings. There is a stable 1.3 cm left adrenal adenoma
with a density of 6 HU, unchanged from 7995 CT.

Musculoskeletal/Chest wall: There is no suspicious chest wall mass
or suspicious osseous finding. Subcutaneous nodules are present
within the upper back, measuring 2.5 cm on the right (image [DATE])
and 4.3 cm on the left (image [DATE]). Although these measure higher
than water density, they are unchanged from the most recent study
and were present in 7995, likely reflecting atypical sebaceous
cysts. Mild spondylosis.
IMPRESSION: 1. Imaging for bronchoscopy planning and guidance.
2. Interval partial clearing of the patchy ground-glass and part
solid pulmonary nodules compared with the prior study of 4 months
ago, most likely reflecting resolving inflammation.
3. Clustered solid nodules medially in the left lower lobe have not
significantly changed, favored to be postinflammatory. Suggest CT
follow-up in 6 months.
4. Coronary and aortic atherosclerosis (D15TX-EM0.0). Emphysema
(D15TX-ZSP.8).
5. Probable sebaceous cysts bilaterally in the upper back, enlarged
from 7995, but unchanged from more recent study.

## 2023-03-28 ENCOUNTER — Ambulatory Visit: Payer: Medicare Other | Admitting: Emergency Medicine

## 2023-03-28 ENCOUNTER — Ambulatory Visit (INDEPENDENT_AMBULATORY_CARE_PROVIDER_SITE_OTHER): Payer: Medicare Other | Admitting: Emergency Medicine

## 2023-03-28 ENCOUNTER — Encounter: Payer: Self-pay | Admitting: Emergency Medicine

## 2023-03-28 VITALS — BP 126/74 | HR 69 | Temp 97.7°F | Ht 63.0 in | Wt 162.4 lb

## 2023-03-28 DIAGNOSIS — R918 Other nonspecific abnormal finding of lung field: Secondary | ICD-10-CM | POA: Diagnosis not present

## 2023-03-28 DIAGNOSIS — Z720 Tobacco use: Secondary | ICD-10-CM | POA: Diagnosis not present

## 2023-03-28 DIAGNOSIS — J449 Chronic obstructive pulmonary disease, unspecified: Secondary | ICD-10-CM

## 2023-03-28 LAB — PULMONARY FUNCTION TEST
DL/VA % pred: 75 %
DL/VA: 3.15 ml/min/mmHg/L
DLCO cor % pred: 73 %
DLCO cor: 14.01 ml/min/mmHg
DLCO unc % pred: 73 %
DLCO unc: 14.01 ml/min/mmHg
FEF 25-75 Post: 0.28 L/sec
FEF 25-75 Pre: 0.8 L/sec
FEF2575-%Change-Post: -64 %
FEF2575-%Pred-Post: 14 %
FEF2575-%Pred-Pre: 40 %
FEV1-%Change-Post: -27 %
FEV1-%Pred-Post: 50 %
FEV1-%Pred-Pre: 70 %
FEV1-Post: 1.15 L
FEV1-Pre: 1.6 L
FEV1FVC-%Change-Post: -12 %
FEV1FVC-%Pred-Pre: 80 %
FEV6-%Change-Post: -18 %
FEV6-%Pred-Post: 71 %
FEV6-%Pred-Pre: 88 %
FEV6-Post: 2.04 L
FEV6-Pre: 2.51 L
FEV6FVC-%Change-Post: -1 %
FEV6FVC-%Pred-Post: 99 %
FEV6FVC-%Pred-Pre: 101 %
FVC-%Change-Post: -17 %
FVC-%Pred-Post: 71 %
FVC-%Pred-Pre: 86 %
FVC-Post: 2.13 L
FVC-Pre: 2.58 L
Post FEV1/FVC ratio: 54 %
Post FEV6/FVC ratio: 95 %
Pre FEV1/FVC ratio: 62 %
Pre FEV6/FVC Ratio: 97 %
RV % pred: 107 %
RV: 2.24 L
TLC % pred: 103 %
TLC: 5.09 L

## 2023-03-28 MED ORDER — SPIRIVA RESPIMAT 2.5 MCG/ACT IN AERS: 2.0000 | INHALATION_SPRAY | Freq: Every day | RESPIRATORY_TRACT | 0 refills | Status: DC

## 2023-03-28 NOTE — Assessment & Plan Note (Addendum)
Moderate obstruction confirmed on her pulmonary function testing from today.  She did benefit from the Spiriva and we will plan to continue this.

## 2023-03-28 NOTE — Assessment & Plan Note (Signed)
She is due for repeat CT scan of the chest in 06/2023 to follow pulmonary nodules.  Her left lower lobe nodule was resolved on her scan from June 2023.

## 2023-03-28 NOTE — Progress Notes (Signed)
Subjective:    Patient ID: Alyssa [name], female    DOB: 30-Nov-1956, 67 y.o.   MRN: [id number]  HPI  ROV 01/15/23 --67 year old woman with a history of tobacco use who returns today for follow-up of abnormal CT scan of the chest.  Most concerning nodule in her left lower lobe was resolved on her most recent scan 05/2022.  Planning to repeat later this year 06/2023.  Overall she has been breathing ok, but she does have SOB when she is doing housework, showering. She uses albuterol very rarely. She coughs at night, clear mucous. She is still smoking 10-15 cig a day.    ROV 03/28/2023 --follow-up visit for 67 year old woman with a history of tobacco use, pulmonary nodular disease, dyspnea.  She underwent pulmonary function testing today as below.  I tried starting Spiriva Respimat at her last visit.  She reports that the spiriva did help her - less SOB. Rare albuterol use, sometimes w heavier exertion.  Still smoking approximately 2-5 cig a day. She is interested in stopping.   Pulmonary function testing 03/28/2023 reviewed by me, shows moderate obstruction with decreased airflow following bronchodilators (unclear significance, question irritation to SABA versus fatigue).  Normal lung volumes.  Decreased diffusion capacity.   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   Primary localized osteoarthrosis, lower leg    oa   Pulmonary nodules    Tobacco abuse    Wears dentures top   Wears glasses      Family History  Problem Relation Age of Onset   Stroke Mother    Hypertension Father    Breast cancer Paternal Aunt      Social History   Socioeconomic History   Marital status: Married    Spouse name: Not on file   Number of children: Not on file   Years of education: Not on  file   Highest education level: Not on file  Occupational History   Not on file  Tobacco Use   Smoking status: Every Day    Packs/day: 0.50    Years: 40.00    Additional pack years: 0.00    Total pack years: 20.00    Types: Cigarettes   Smokeless tobacco: Never   Tobacco comments:    Pt smokes 5 cigarettes a day ARJ 03/28/23  Vaping Use   Vaping Use: Never used  Substance and Sexual Activity   Alcohol use: No   Drug use: No   Sexual activity: Not Currently  Other Topics Concern   Not on file  Social History Narrative   Not on file   Social Determinants of Health   Financial Resource Strain: Not on file  Food Insecurity: Not on file  Transportation Needs: Not on file  Physical Activity: Not on file  Stress: Not on file  Social Connections: Not on file  Intimate Partner Violence: Not on file    Has worked as a caregiver and housewife [place] No military No TB exposure.   Allergies  Allergen Reactions   Bee Venom Anaphylaxis   Arthrotec [Diclofenac-Misoprostol] Other (See Comments)    Not sure   Codeine Itching    headache   Cymbalta [Duloxetine Hcl] Other (See Comments)    "  felt I was sinking into the floor"   Effexor [Venlafaxine Hydrochloride] Other (See Comments)    confusion   Escitalopram Oxalate Other (See Comments)    confusion   Hydrocodone Other (See Comments)    Severe headache, itching   Neurontin [Gabapentin] Other (See Comments)    Not sure   Oxymorphone Hcl Other (See Comments)    "It didn't work"   Pentazocine Lactate Other (See Comments)    Not sure   Provigil [Modafinil] Other (See Comments)    Not sure   Sertraline Hcl Other (See Comments)    More depressed     Outpatient Medications Prior to Visit  Medication Sig Dispense Refill   acetaminophen (TYLENOL) 500 MG tablet Take 1 tablet (500 mg total) by mouth every 6 (six) hours as needed (pain). (Patient taking differently: Take 500 mg by mouth every 6 (six) hours as needed for  headache (pain).) 30 tablet 0   albuterol (VENTOLIN HFA) 108 (90 Base) MCG/ACT inhaler Inhale 2 puffs into the lungs every 6 (six) hours as needed for wheezing or shortness of breath. 8 g 6   ALPRAZolam (XANAX) 1 MG tablet Take 1 mg by mouth 3 (three) times daily.     ascorbic acid (VITAMIN C) 500 MG tablet Take 500 mg by mouth daily.     aspirin EC 81 MG tablet Take 81 mg by mouth at bedtime. Swallow whole.     Cholecalciferol (D3 MAXIMUM STRENGTH) 125 MCG (5000 UT) capsule Take 5,000 Units by mouth daily.     methocarbamol (ROBAXIN) 750 MG tablet Take 1 tablet (750 mg total) by mouth 3 (three) times daily. 30 tablet 0   polyethylene glycol powder (GLYCOLAX/MIRALAX) 17 GM/SCOOP powder Take 17 g by mouth daily. Drink 17g (1 scoop) dissolved in water per day. (Patient taking differently: Take 17 g by mouth daily.) 255 g 0   pregabalin (LYRICA) 100 MG capsule Take 100 mg by mouth 3 (three) times daily.     traMADol (ULTRAM) 50 MG tablet Take 100 mg by mouth in the morning, at noon, and at bedtime.     Tiotropium Bromide Monohydrate (SPIRIVA RESPIMAT) 2.5 MCG/ACT AERS Inhale 2 puffs into the lungs daily. (Patient not taking: Reported on 03/28/2023) 4 g 0   No facility-administered medications prior to visit.          Objective:   Physical Exam Vitals:   03/28/23 1306  BP: 126/74  Pulse: 69  Temp: 97.7 F (36.5 C)  TempSrc: Oral  SpO2: 98%  Weight: 162 lb 6.4 oz (73.7 kg)  Height: 5\' 3"  (1.6 m)    Gen: Pleasant, well-nourished, in no distress,  normal affect  ENT: No lesions,  mouth clear,  oropharynx clear, no postnasal drip  Neck: No JVD, no stridor  Lungs: No use of accessory muscles, no crackles or wheezing on normal respiration, no wheeze on forced expiration  Cardiovascular: RRR, heart sounds normal, no murmur or gallops, no peripheral edema  Musculoskeletal: No deformities, no cyanosis or clubbing  Neuro: alert, awake, non focal  Skin: Warm, no lesions or rash       Assessment & Plan:  Pulmonary nodules She is due for repeat CT scan of the chest in 06/2023 to follow pulmonary nodules.  Her left lower lobe nodule was resolved on her scan from June 2023.  COPD (chronic obstructive pulmonary disease) (HCC) Moderate obstruction confirmed on her pulmonary function testing from today.  She did benefit from the Spiriva and we will plan  to continue this.  Tobacco use Discussed cessation.  She is down to 3-5 cigarettes daily and is motivated to quit.  We will talk about setting a formal quit date, possible medications to assist with quitting at her next visit.   Baltazar Apo, MD, PhD 03/28/2023, 1:21 PM [facility] [phone] or if no answer before 7:00PM call [phone] For any issues after 7:00PM please call eLink [phone]

## 2023-03-28 NOTE — Progress Notes (Signed)
Full PFT performed today. °

## 2023-03-28 NOTE — Assessment & Plan Note (Addendum)
Discussed cessation.  She is down to 3-5 cigarettes daily and is motivated to quit.  We will talk about setting a formal quit date, possible medications to assist with quitting at her next visit.

## 2023-03-28 NOTE — Patient Instructions (Signed)
Full PFT performed today. °

## 2023-03-28 NOTE — Patient Instructions (Signed)
We will plan to continue Spiriva Respimat 2 puffs once daily.  Will send a prescription for this to your pharmacy. Keep albuterol available to use 2 puffs when needed for shortness of breath, chest tightness, wheezing. Get your CT scan of the chest in June as planned Try to work on decreasing your cigarettes.  Ultimate goal will be to stop altogether. Follow Dr. Lamonte Sakai in June after your CT scan so we can review the results together.

## 2023-03-28 NOTE — Addendum Note (Signed)
Addended by: Gavin Potters R on: 03/28/2023 01:45 PM   Modules accepted: Orders

## 2023-05-17 ENCOUNTER — Telehealth: Payer: Self-pay | Admitting: Emergency Medicine

## 2023-05-17 NOTE — Telephone Encounter (Signed)
Alyssa [name] looks like you scheduled this can you get it resc

## 2023-05-25 ENCOUNTER — Encounter (HOSPITAL_COMMUNITY): Payer: Self-pay

## 2023-05-25 ENCOUNTER — Inpatient Hospital Stay (HOSPITAL_COMMUNITY)
Admission: AD | Admit: 2023-05-25 | Discharge: 2023-05-27 | DRG: 282 | Disposition: A | Discharge status: Home / Self Care | Payer: Medicare Other | Source: Other Acute Inpatient Hospital | Attending: Internal Medicine | Admitting: Internal Medicine

## 2023-05-25 DIAGNOSIS — Z79899 Other long term (current) drug therapy: Secondary | ICD-10-CM

## 2023-05-25 DIAGNOSIS — Z888 Allergy status to other drugs, medicaments and biological substances status: Secondary | ICD-10-CM

## 2023-05-25 DIAGNOSIS — I219 Acute myocardial infarction, unspecified: Secondary | ICD-10-CM

## 2023-05-25 DIAGNOSIS — M5416 Radiculopathy, lumbar region: Secondary | ICD-10-CM | POA: Insufficient documentation

## 2023-05-25 DIAGNOSIS — J449 Chronic obstructive pulmonary disease, unspecified: Secondary | ICD-10-CM | POA: Diagnosis present

## 2023-05-25 DIAGNOSIS — F1410 Cocaine abuse, uncomplicated: Secondary | ICD-10-CM | POA: Diagnosis present

## 2023-05-25 DIAGNOSIS — I214 Non-ST elevation (NSTEMI) myocardial infarction: Principal | ICD-10-CM

## 2023-05-25 DIAGNOSIS — Z885 Allergy status to narcotic agent status: Secondary | ICD-10-CM

## 2023-05-25 DIAGNOSIS — R55 Syncope and collapse: Principal | ICD-10-CM | POA: Diagnosis present

## 2023-05-25 DIAGNOSIS — F1910 Other psychoactive substance abuse, uncomplicated: Secondary | ICD-10-CM | POA: Diagnosis present

## 2023-05-25 DIAGNOSIS — I2510 Atherosclerotic heart disease of native coronary artery without angina pectoris: Secondary | ICD-10-CM | POA: Diagnosis present

## 2023-05-25 DIAGNOSIS — G894 Chronic pain syndrome: Secondary | ICD-10-CM | POA: Diagnosis present

## 2023-05-25 DIAGNOSIS — M545 Low back pain, unspecified: Secondary | ICD-10-CM | POA: Diagnosis present

## 2023-05-25 DIAGNOSIS — R7989 Other specified abnormal findings of blood chemistry: Secondary | ICD-10-CM

## 2023-05-25 DIAGNOSIS — F17210 Nicotine dependence, cigarettes, uncomplicated: Secondary | ICD-10-CM | POA: Diagnosis present

## 2023-05-25 DIAGNOSIS — F1510 Other stimulant abuse, uncomplicated: Secondary | ICD-10-CM | POA: Diagnosis present

## 2023-05-25 DIAGNOSIS — Z91030 Bee allergy status: Secondary | ICD-10-CM

## 2023-05-25 DIAGNOSIS — F1210 Cannabis abuse, uncomplicated: Secondary | ICD-10-CM | POA: Diagnosis present

## 2023-05-25 DIAGNOSIS — Z7982 Long term (current) use of aspirin: Secondary | ICD-10-CM

## 2023-05-25 DIAGNOSIS — I700 Atherosclerosis of aorta: Secondary | ICD-10-CM | POA: Diagnosis present

## 2023-05-25 DIAGNOSIS — Z8249 Family history of ischemic heart disease and other diseases of the circulatory system: Secondary | ICD-10-CM

## 2023-05-25 DIAGNOSIS — Z823 Family history of stroke: Secondary | ICD-10-CM

## 2023-05-25 DIAGNOSIS — G8929 Other chronic pain: Secondary | ICD-10-CM | POA: Diagnosis present

## 2023-05-25 HISTORY — DX: Acute myocardial infarction, unspecified: I21.9

## 2023-05-25 MED ORDER — ACETAMINOPHEN 325 MG PO TABS
650.0000 mg | ORAL_TABLET | Freq: Four times a day (QID) | ORAL | Status: DC | PRN
  Administered 2023-05-26: 650 mg via ORAL
  Filled 2023-05-25: qty 2

## 2023-05-25 MED ORDER — ACETAMINOPHEN 650 MG RE SUPP: 650.0000 mg | Freq: Four times a day (QID) | RECTAL | Status: DC | PRN

## 2023-05-25 MED ORDER — ONDANSETRON HCL 4 MG PO TABS: 4.0000 mg | ORAL_TABLET | Freq: Four times a day (QID) | ORAL | Status: DC | PRN

## 2023-05-25 MED ORDER — ONDANSETRON HCL 4 MG/2ML IJ SOLN: 4.0000 mg | Freq: Four times a day (QID) | INTRAMUSCULAR | Status: DC | PRN

## 2023-05-25 MED ORDER — SODIUM CHLORIDE 0.9% FLUSH
3.0000 mL | Freq: Two times a day (BID) | INTRAVENOUS | Status: DC
  Administered 2023-05-26 – 2023-05-27 (×4): 3 mL via INTRAVENOUS

## 2023-05-25 MED ORDER — ENOXAPARIN SODIUM 40 MG/0.4ML IJ SOSY
40.0000 mg | PREFILLED_SYRINGE | INTRAMUSCULAR | Status: DC
  Administered 2023-05-26 – 2023-05-27 (×2): 40 mg via SUBCUTANEOUS
  Filled 2023-05-25 (×2): qty 0.4

## 2023-05-25 NOTE — Assessment & Plan Note (Addendum)
In setting of cocaine and amphetamine abuse. Syncope pathway 2d echo Check 3rd trop in AM Will defer to day team if they feel cards consult needed

## 2023-05-25 NOTE — Assessment & Plan Note (Signed)
Including cocaine, amphetamines.

## 2023-05-26 ENCOUNTER — Inpatient Hospital Stay (HOSPITAL_COMMUNITY): Payer: Medicare Other

## 2023-05-26 DIAGNOSIS — Z888 Allergy status to other drugs, medicaments and biological substances status: Secondary | ICD-10-CM | POA: Diagnosis not present

## 2023-05-26 DIAGNOSIS — R7989 Other specified abnormal findings of blood chemistry: Secondary | ICD-10-CM

## 2023-05-26 DIAGNOSIS — G8929 Other chronic pain: Secondary | ICD-10-CM | POA: Diagnosis not present

## 2023-05-26 DIAGNOSIS — Z823 Family history of stroke: Secondary | ICD-10-CM | POA: Diagnosis not present

## 2023-05-26 DIAGNOSIS — I700 Atherosclerosis of aorta: Secondary | ICD-10-CM | POA: Diagnosis present

## 2023-05-26 DIAGNOSIS — I214 Non-ST elevation (NSTEMI) myocardial infarction: Secondary | ICD-10-CM | POA: Diagnosis present

## 2023-05-26 DIAGNOSIS — Z91030 Bee allergy status: Secondary | ICD-10-CM | POA: Diagnosis not present

## 2023-05-26 DIAGNOSIS — M545 Low back pain, unspecified: Secondary | ICD-10-CM

## 2023-05-26 DIAGNOSIS — F17210 Nicotine dependence, cigarettes, uncomplicated: Secondary | ICD-10-CM | POA: Diagnosis present

## 2023-05-26 DIAGNOSIS — F1410 Cocaine abuse, uncomplicated: Secondary | ICD-10-CM | POA: Diagnosis present

## 2023-05-26 DIAGNOSIS — R55 Syncope and collapse: Secondary | ICD-10-CM | POA: Diagnosis present

## 2023-05-26 DIAGNOSIS — F1510 Other stimulant abuse, uncomplicated: Secondary | ICD-10-CM | POA: Diagnosis present

## 2023-05-26 DIAGNOSIS — R9431 Abnormal electrocardiogram [ECG] [EKG]: Secondary | ICD-10-CM

## 2023-05-26 DIAGNOSIS — G894 Chronic pain syndrome: Secondary | ICD-10-CM | POA: Diagnosis present

## 2023-05-26 DIAGNOSIS — F1210 Cannabis abuse, uncomplicated: Secondary | ICD-10-CM | POA: Diagnosis present

## 2023-05-26 DIAGNOSIS — Z7982 Long term (current) use of aspirin: Secondary | ICD-10-CM | POA: Diagnosis not present

## 2023-05-26 DIAGNOSIS — F1910 Other psychoactive substance abuse, uncomplicated: Secondary | ICD-10-CM | POA: Diagnosis not present

## 2023-05-26 DIAGNOSIS — Z885 Allergy status to narcotic agent status: Secondary | ICD-10-CM | POA: Diagnosis not present

## 2023-05-26 DIAGNOSIS — Z79899 Other long term (current) drug therapy: Secondary | ICD-10-CM | POA: Diagnosis not present

## 2023-05-26 DIAGNOSIS — I2510 Atherosclerotic heart disease of native coronary artery without angina pectoris: Secondary | ICD-10-CM | POA: Diagnosis present

## 2023-05-26 DIAGNOSIS — J449 Chronic obstructive pulmonary disease, unspecified: Secondary | ICD-10-CM | POA: Diagnosis present

## 2023-05-26 DIAGNOSIS — Z8249 Family history of ischemic heart disease and other diseases of the circulatory system: Secondary | ICD-10-CM | POA: Diagnosis not present

## 2023-05-26 LAB — CBC WITH DIFFERENTIAL/PLATELET
Abs Immature Granulocytes: 0.02 10*3/uL (ref 0.00–0.07)
Basophils Absolute: 0 10*3/uL (ref 0.0–0.1)
Basophils Relative: 0 %
Eosinophils Absolute: 0 10*3/uL (ref 0.0–0.5)
Eosinophils Relative: 0 %
HCT: 40.6 % (ref 36.0–46.0)
Hemoglobin: 13.1 g/dL (ref 12.0–15.0)
Immature Granulocytes: 0 %
Lymphocytes Relative: 10 %
Lymphs Abs: 0.6 10*3/uL — ABNORMAL LOW (ref 0.7–4.0)
MCH: 29 pg (ref 26.0–34.0)
MCHC: 32.3 g/dL (ref 30.0–36.0)
MCV: 90 fL (ref 80.0–100.0)
Monocytes Absolute: 0.4 10*3/uL (ref 0.1–1.0)
Monocytes Relative: 7 %
Neutro Abs: 4.9 10*3/uL (ref 1.7–7.7)
Neutrophils Relative %: 83 %
Platelets: 227 10*3/uL (ref 150–400)
RBC: 4.51 MIL/uL (ref 3.87–5.11)
RDW: 12.5 % (ref 11.5–15.5)
WBC: 5.9 10*3/uL (ref 4.0–10.5)
nRBC: 0 % (ref 0.0–0.2)

## 2023-05-26 LAB — COMPREHENSIVE METABOLIC PANEL
ALT: 9 U/L (ref 0–44)
AST: 20 U/L (ref 15–41)
Albumin: 3.3 g/dL — ABNORMAL LOW (ref 3.5–5.0)
Alkaline Phosphatase: 62 U/L (ref 38–126)
Anion gap: 8 (ref 5–15)
BUN: 12 mg/dL (ref 8–23)
CO2: 28 mmol/L (ref 22–32)
Calcium: 8.7 mg/dL — ABNORMAL LOW (ref 8.9–10.3)
Chloride: 102 mmol/L (ref 98–111)
Creatinine, Ser: 0.75 mg/dL (ref 0.44–1.00)
GFR, Estimated: >60 mL/min (ref >60)
Glucose, Bld: 119 mg/dL — ABNORMAL HIGH (ref 70–99)
Potassium: 3.7 mmol/L (ref 3.5–5.1)
Sodium: 138 mmol/L (ref 135–145)
Total Bilirubin: 0.6 mg/dL (ref 0.3–1.2)
Total Protein: 6 g/dL — ABNORMAL LOW (ref 6.5–8.1)

## 2023-05-26 LAB — MAGNESIUM: Magnesium: 2.4 mg/dL (ref 1.7–2.4)

## 2023-05-26 LAB — ECHOCARDIOGRAM COMPLETE
AR max vel: 2.84 cm2
AV Area VTI: 2.83 cm2
AV Area mean vel: 2.73 cm2
AV Mean grad: 2 mmHg
AV Peak grad: 4.7 mmHg
Ao pk vel: 1.08 m/s
Area-P 1/2: 2.7 cm2
Height: 65 in
S' Lateral: 2.3 cm
Weight: 2544 oz

## 2023-05-26 LAB — HIV ANTIBODY (ROUTINE TESTING W REFLEX): HIV Screen 4th Generation wRfx: NONREACTIVE

## 2023-05-26 LAB — TROPONIN I (HIGH SENSITIVITY)
Troponin I (High Sensitivity): 608 ng/L (ref <18)
Troponin I (High Sensitivity): 541 ng/L (ref <18)

## 2023-05-26 LAB — PHOSPHORUS: Phosphorus: 2.4 mg/dL — ABNORMAL LOW (ref 2.5–4.6)

## 2023-05-26 MED ORDER — TRAMADOL HCL 50 MG PO TABS
100.0000 mg | ORAL_TABLET | Freq: Four times a day (QID) | ORAL | Status: DC | PRN
  Administered 2023-05-26 – 2023-05-27 (×3): 100 mg via ORAL
  Filled 2023-05-26 (×3): qty 2

## 2023-05-26 MED ORDER — ALPRAZOLAM 0.5 MG PO TABS
0.5000 mg | ORAL_TABLET | Freq: Three times a day (TID) | ORAL | Status: DC | PRN
  Administered 2023-05-26: 0.5 mg via ORAL
  Filled 2023-05-26: qty 1

## 2023-05-26 MED ORDER — ALBUTEROL SULFATE (2.5 MG/3ML) 0.083% IN NEBU: 2.5000 mg | INHALATION_SOLUTION | Freq: Four times a day (QID) | RESPIRATORY_TRACT | Status: DC | PRN

## 2023-05-26 MED ORDER — UMECLIDINIUM BROMIDE 62.5 MCG/ACT IN AEPB
1.0000 | INHALATION_SPRAY | Freq: Every day | RESPIRATORY_TRACT | Status: DC
  Administered 2023-05-27: 1 via RESPIRATORY_TRACT
  Filled 2023-05-26 (×2): qty 7

## 2023-05-26 MED ORDER — ALPRAZOLAM 0.5 MG PO TABS
1.0000 mg | ORAL_TABLET | Freq: Three times a day (TID) | ORAL | Status: DC
  Administered 2023-05-26: 1 mg via ORAL
  Filled 2023-05-26: qty 2

## 2023-05-26 MED ORDER — ASPIRIN 81 MG PO TBEC
81.0000 mg | DELAYED_RELEASE_TABLET | Freq: Every day | ORAL | Status: DC
  Administered 2023-05-26 (×2): 81 mg via ORAL
  Filled 2023-05-26 (×2): qty 1

## 2023-05-26 MED ORDER — PREGABALIN 100 MG PO CAPS
100.0000 mg | ORAL_CAPSULE | Freq: Three times a day (TID) | ORAL | Status: DC
  Administered 2023-05-26 – 2023-05-27 (×4): 100 mg via ORAL
  Filled 2023-05-26 (×4): qty 1

## 2023-05-26 NOTE — Progress Notes (Signed)
  Critical troponin lab result received. Pt resting. Notified on-call provider Dr Imogene Burn. No new orders received at this time.

## 2023-05-26 NOTE — Assessment & Plan Note (Signed)
Cont tramadol, lyrica.

## 2023-05-26 NOTE — Progress Notes (Signed)
Cardiology Consultation   Patient ID: Alyssa [name] MRN: [id number]; DOB: August 21, 1956  Admit date: 05/25/2023 Date of Consult: 05/26/2023  PCP:  Geoffry Paradise, MD   [facility] Providers Cardiologist:  None        Patient Profile:   Alyssa [name] is a 67 y.o. female with a hx of minor nonobstructive CAD who is being seen 05/26/2023 for the evaluation of abnormal ECG and cardiac enzymes at the request of Dr. Jerral Ralph.  History of Present Illness:   Alyssa [name] reports being in her usual of health Friday evening when she took her usual prescriptions of tramadol, pregabalin and alprazolam but also took "just 1 bump of cocaine".  Shortly after that she felt that "something was wrong and felt that she might pass out.  She reports that she saw her friend "get farther and farther away" after which she lost consciousness.  She woke up in the ambulance.  She tells me she was administered 2 doses of Narcan.  He denies overdosing on her usual prescription medications which she has been taking for years.  She had no prodromal chest pain or shortness of breath and has not had any chest discomfort throughout her hospitalization at [facility].  She is chest pain-free now.  She has not had any problems or shortness of breath.  She has not noticed any palpitations.  Denies interval focal neurological complaints.  Has not had edema, orthopnea, PND or lower extremity edema.  No unilateral calf swelling or tenderness.  The toxicology report from [facility] lists not only cocaine and benzodiazepines and opiates, but also THC and methamphetamines.  Her ECG shows severe very broad symmetrical inverted T waves across the anterior precordium and marked prolongation of the QT interval.  It is very different from her ECG 1 year ago which showed mild ST segment elevation in multiple leads.  She has had abnormal troponin levels that have peaked at around 600 and seem to now be trending  downwards.  Roughly 1 year ago in March 2023 she was seen in the emergency room at [facility] with complaints of throat pain.  Her ECG showed subtle ST segment elevation in multiple leads and she was taken to the Cath Lab where she only had nonobstructive CAD.  The echocardiogram showed normal regional wall motion and overall LV systolic function.  CT of the chest does show coronary and aortic atherosclerotic calcifications.  She has a history of smoking both tobacco and marijuana, but she does not have diabetes mellitus, hypercholesterolemia or hypertension.  Baseline LDL cholesterol is less than 100 and she has excellent HDL cholesterol.  Has no family history of premature onset CAD or PAD.  She is followed by Dr. Elwin Sleight for mixed density pulmonary nodules in the left lower lobe.  She has a roughly 40-pack-year history of smoking.  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  She bears a diagnosis of COPD, but very recent pulmonary function test showed minimal reduction in FEV1 at 70% of predicted.   Past Medical History:  Diagnosis Date   Bright's disease age 20   issue resolved   Chronic pain    COPD (chronic obstructive pulmonary disease) (HCC)    Headache    Marijuana abuse    Nerve damage    to back and legs   Primary  localized osteoarthrosis, lower leg    oa   Pulmonary nodules    Tobacco abuse    Wears dentures top   Wears glasses     Past Surgical History:  Procedure Laterality Date   ABDOMINAL HYSTERECTOMY  yrs ago   1 ovary left   ANTERIOR AND POSTERIOR REPAIR N/A 05/30/2021   Procedure: ANTERIOR (CYSTOCELE) AND POSTERIOR REPAIR (RECTOCELE);  Surgeon: Lavina Hamman, MD;  Location: [facility];  Service: Gynecology;  Laterality: N/A;   ANTERIOR CERVICAL DECOMP/DISCECTOMY FUSION N/A 09/10/2022    Procedure: Anterior Cervical Decompression Fusion - Cervical five-Cervical six;  Surgeon: Julio Sicks, MD;  Location: [facility] OR;  Service: Neurosurgery;  Laterality: N/A;   BACK SURGERY  yrs ago   x3 bolts  and screws and rods x 2 lower back   BREAST BIOPSY Left    COLPOCLEISIS N/A 03/22/2022   Procedure: EXAM UNDER ANESTHESIA, COLPOCLEISIS; PERINEORRHAPHY;  Surgeon: Marguerita Beards, MD;  Location: [facility];  Service: Gynecology;  Laterality: N/A;   CYSTOSCOPY N/A 03/22/2022   Procedure: CYSTOSCOPY;  Surgeon: Marguerita Beards, MD;  Location: [facility];  Service: Gynecology;  Laterality: N/A;   HEMORRHOID SURGERY  yrs ago   LEFT HEART CATH AND CORONARY ANGIOGRAPHY N/A 03/08/2022   Procedure: LEFT HEART CATH AND CORONARY ANGIOGRAPHY;  Surgeon: Kathleene Hazel, MD;  Location: [facility] INVASIVE CV LAB;  Service: Cardiovascular;  Laterality: N/A;   ROBOTIC ASSISTED LAPAROSCOPIC SACROCOLPOPEXY N/A 02/13/2022   Procedure: XI ROBOTIC ASSISTED LAPAROSCOPIC LYSIS OF ADHESIONS;  Surgeon: Marguerita Beards, MD;  Location: [facility];  Service: Gynecology;  Laterality: N/A;   TONSILLECTOMY     VAGINAL PROLAPSE REPAIR N/A 10/19/2021   Procedure: VAGINAL VAULT SUSPENSION;  Surgeon: Lavina Hamman, MD;  Location: [facility];  Service: Gynecology;  Laterality: N/A;   VULVAR LESION REMOVAL N/A 05/30/2021   Procedure: REMOVAL OF VULVAR CYST;  Surgeon: Lavina Hamman, MD;  Location: [facility];  Service: Gynecology;  Laterality: N/A;       Inpatient Medications: Scheduled Meds:  aspirin EC  81 mg Oral QHS   enoxaparin (LOVENOX) injection  40 mg Subcutaneous Q24H   pregabalin  100 mg Oral TID   sodium chloride flush  3 mL Intravenous Q12H   umeclidinium bromide  1 puff Inhalation Daily   Continuous Infusions:  PRN Meds: acetaminophen **OR** acetaminophen, albuterol, ALPRAZolam, ondansetron **OR** ondansetron  (ZOFRAN) IV, traMADol  Allergies:    Allergies  Allergen Reactions   Bee Venom Anaphylaxis   Arthrotec [Diclofenac-Misoprostol] Other (See Comments)    Unknown reaction   Codeine Itching and Other (See Comments)    Headache    Cymbalta [Duloxetine Hcl] Other (See Comments)    Altered mental state Confusion    Effexor [Venlafaxine Hydrochloride] Other (See Comments)    Confusion    Hydrocodone Itching and Other (See Comments)    Severe headache   Lexapro [Escitalopram Oxalate] Other (See Comments)    Confusion    Neurontin [Gabapentin] Other (See Comments)    Unknown reaction   Opana [Oxymorphone Hcl] Other (See Comments)    Not effective   Provigil [Modafinil] Other (See Comments)    Unknown reaction   Talwin [Pentazocine] Other (See Comments)    Unknown reaction   Zoloft [Sertraline] Other (See Comments)    Increased depression    Social History:   Social History   Socioeconomic History   Marital status: Married    Spouse  name: Not on file   Number of children: Not on file   Years of education: Not on file   Highest education level: Not on file  Occupational History   Not on file  Tobacco Use   Smoking status: Every Day    Packs/day: 0.50    Years: 40.00    Additional pack years: 0.00    Total pack years: 20.00    Types: Cigarettes   Smokeless tobacco: Never   Tobacco comments:    Pt smokes 5 cigarettes a day ARJ 03/28/23  Vaping Use   Vaping Use: Never used  Substance and Sexual Activity   Alcohol use: No   Drug use: No   Sexual activity: Not Currently  Other Topics Concern   Not on file  Social History Narrative   Not on file   Social Determinants of Health   Financial Resource Strain: Not on file  Food Insecurity: No Food Insecurity (05/26/2023)   Hunger Vital Sign    Worried About Running Out of Food in the Last Year: Never true    Ran Out of Food in the Last Year: Never true  Transportation Needs: No Transportation Needs (05/26/2023)   PRAPARE  - Administrator, Civil Service (Medical): No    Lack of Transportation (Non-Medical): No  Physical Activity: Not on file  Stress: Not on file  Social Connections: Not on file  Intimate Partner Violence: Not At Risk (05/26/2023)   Humiliation, Afraid, Rape, and Kick questionnaire    Fear of Current or Ex-Partner: No    Emotionally Abused: No    Physically Abused: No    Sexually Abused: No    Family History:    Family History  Problem Relation Age of Onset   Stroke Mother    Hypertension Father    Breast cancer Paternal Aunt      ROS:  Please see the history of present illness.   All other ROS reviewed and negative.     Physical Exam/Data:   Vitals:   05/26/23 0014 05/26/23 0347  BP: 112/68 114/77  Pulse: 75 72  Resp: 11 15  Temp: 98.4 F (36.9 C) 98.4 F (36.9 C)  TempSrc:  Oral  SpO2: 95% 96%  Weight: 72.2 kg 72.1 kg  Height: 5\' 5"  (1.651 m)    No intake or output data in the 24 hours ending 05/26/23 1340    05/26/2023    3:47 AM 05/26/2023   12:14 AM 03/28/2023    1:06 PM  Last 3 Weights  Weight (lbs) 159 lb 159 lb 1.6 oz 162 lb 6.4 oz  Weight (kg) 72.122 kg 72.167 kg 73.664 kg     Body mass index is 26.46 kg/m.  General:  Well nourished, well developed, in no acute distress, appears younger than stated age. HEENT: normal Neck: no JVD Vascular: No carotid bruits; Distal pulses 2+ bilaterally Cardiac:  normal S1, S2; RRR; no murmur.  There is a distinct S4 gallop.  There are no pericardial rubs. Lungs:  clear to auscultation bilaterally, no wheezing, rhonchi or rales  Abd: soft, nontender, no hepatomegaly  Ext: no edema Musculoskeletal:  No deformities, BUE and BLE strength normal and equal Skin: warm and dry  Neuro:  CNs 2-12 intact, no focal abnormalities noted Psych:  Normal affect   EKG:  The EKG was personally reviewed and demonstrates: Sinus rhythm, severe T wave inversion across the entire anterior precordium with symmetrical deep T  waves.  Prolonged QTc around 500  ms. Telemetry:  Telemetry was personally reviewed and demonstrates: Normal sinus rhythm  Relevant CV Studies:  Echo 03/09/2022 1. Left ventricular ejection fraction, by estimation, is 60 to 65%. The  left ventricle has normal function. The left ventricle has no regional  wall motion abnormalities. Left ventricular diastolic parameters were  normal. The average left ventricular  global longitudinal strain is -18.0 %. The global longitudinal strain is  normal.   2. Right ventricular systolic function is normal. The right ventricular  size is normal. Tricuspid regurgitation signal is inadequate for assessing  PA pressure.   3. The mitral valve is normal in structure. No evidence of mitral valve  regurgitation. No evidence of mitral stenosis.   4. The aortic valve was not well visualized. Aortic valve regurgitation  is not visualized.   5. The inferior vena cava is normal in size with greater than 50%  respiratory variability, suggesting right atrial pressure of 3 mmHg.   Comparison(s): No prior Echocardiogram.  Cath 03/08/2022   1st Diag lesion is 30% stenosed.   Prox LAD to Mid LAD lesion is 20% stenosed.   The left ventricular systolic function is normal.   LV end diastolic pressure is normal.   The left ventricular ejection fraction is greater than 65% by visual estimate.   There is no mitral valve regurgitation.   Mild non-obstructive disease in the Diagonal/mid LAD Moderate caliber Circumflex with no obstructive disease Large dominant RCA with no obstructive disease Hyperdynamic LV systolic function No evidence of dilated aortic root or ascending aorta dissection   Recommendations: Will monitor overnight. Consider other causes of neck pain, chest burning and abnormal EKG.   Laboratory Data:  High Sensitivity Troponin:   Recent Labs  Lab 05/26/23 0046 05/26/23 0145  TROPONINIHS 608* 541*     ChemistryNo results for input(s): "NA", "K",  "CL", "CO2", "GLUCOSE", "BUN", "CREATININE", "CALCIUM", "MG", "GFRNONAA", "GFRAA", "ANIONGAP" in the last 168 hours.  No results for input(s): "PROT", "ALBUMIN", "AST", "ALT", "ALKPHOS", "BILITOT" in the last 168 hours. Lipids No results for input(s): "CHOL", "TRIG", "HDL", "LABVLDL", "LDLCALC", "CHOLHDL" in the last 168 hours.  HematologyNo results for input(s): "WBC", "RBC", "HGB", "HCT", "MCV", "MCH", "MCHC", "RDW", "PLT" in the last 168 hours. Thyroid No results for input(s): "TSH", "FREET4" in the last 168 hours.  BNPNo results for input(s): "BNP", "PROBNP" in the last 168 hours.  DDimer No results for input(s): "DDIMER" in the last 168 hours.   Radiology/Studies:  No results found.   Assessment and Plan:   Abnormal ECG and cardiac high-sensitivity troponin: These are consistent with non-ST segment elevation myocardial infarction, but she has not had any chest pain.  It is possible, unlikely though, that she has had progression of coronary stenosis in the LAD artery since last year and that she had transient occlusion of the LAD artery.  I think it is more likely that she had cocaine induced vasospasm or that this is actually Takotsubo cardiomyopathy related to hypotension in the setting of polypharmacy (both prescription and recreational drugs).  Another possibility is drug induced ventricular arrhythmia as a cause of her syncope.  Will start with an echocardiogram.  If wall motion is normal and there is no evidence of reduction in LVEF would treat conservatively with avoidance of stimulants such as cocaine and methamphetamines, smoking cessation, healthy lifestyle.  If there are distinct wall motion abnormalities consider coronary angiography, although this could still be secondary to vasospasm or stress cardiomyopathy.  She is only receiving prophylactic dose enoxaparin.  Again, since she has not had angina, unclear whether that we could qualify this as a true acute coronary syndrome.   Nevertheless, probably preferable to have on IV heparin until we clarify the mechanism for her EKG changes. Syncope: Could be directly related to substance abuse, but cannot exclude ventricular arrhythmia.Keep on monitor. Polysubstance abuse: Although she states that she is not a habitual cocaine user, she clearly is at risk for severe consequences even death from further drug use.  Strongly recommend avoiding recreational drugs in the future. Nonobstructive CAD: No abnormalities on previous cath.  Other than smoking cigarettes she really does not have much in the way of risk factors that would promote worsening CAD.   Risk Assessment/Risk Scores:     TIMI Risk Score for Unstable Angina or Non-ST Elevation MI:   The patient's TIMI risk score is 3, which indicates a 13% risk of all cause mortality, new or recurrent myocardial infarction or need for urgent revascularization in the next 14 days.          For questions or updates, please contact [facility] Please consult [email or web address] for contact info under    Signed, Thurmon Fair, MD  05/26/2023 1:40 PM

## 2023-05-26 NOTE — Progress Notes (Signed)
Patient seen and examined.  Admitted overnight by nighttime hospitalist.  On my exam, patient is mostly sleepy.  She denies any complaints.  She received her Xanax early morning.  High-sensitivity troponins more than 400 but remained flat. EKG shows significant T wave inversions on lateral leads.  She had similar EKG yesterday in the emergency room.  Without any history of coronary artery disease or coronary artery stents.  In brief, 67 year old with history of chronic pain issues on Xanax, Lyrica and tramadol who took her usual medications and also snorted cocaine, went to shower felt dizzy and wanted to cool off in the bathtub.  Family heard a big thud and patient was found in the bathtub with less responsiveness but intact vitals.  EMS was called.  Taken to emergency room in [place], [place].  Patient without chest pain but troponins elevated 0.04-0.6.  Given Lovenox.  Transferred to [facility].  Syncope, elevated troponins: Prescription drug use, Xanax Lyrica and tramadol Illicit drug use, cocaine and amphetamine as well cannabinoids. Currently stable. Troponins are significantly elevated, echocardiogram pending, EKG with significant abnormalities as compared to previous EKGs including lateral T wave changes. Continue monitoring.  Start mobilizing. Aspirin and statin. Will discuss with cardiology, patient will likely need ischemic evaluation inpatient versus outpatient depends upon echocardiogram findings. 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  Total time spent: 35 minutes.  Same-day admit.  No charge visit.

## 2023-05-26 NOTE — H&P (Signed)
History and Physical    Patient: Alyssa [name]:[id number] DOB: [birth year] DOA: 05/25/2023 DOS: the patient was seen and examined on 05/26/2023 PCP: Geoffry Paradise, MD  Patient coming from: Outside Hospital  Chief Complaint: No chief complaint on file.  HPI: Alyssa [name] is a 67 y.o. female with medical history significant of chronic pain.  Pt presented to OSH after syncopal episode.  Patient found unresponsive in bathtub. Bystander/family did find a pulse.  Patient states she "took a tote of cocaine and 100mg  tramadol. States she began feeling like the room was spinning after doing the cocaine and needed to cool off in bathtub. Family heard a loud bang and she was found in bathtub.  UDS in ED also positive for methamphetamines, THC, opiates (tramadol) and benzos (pt on xanax).  Trops in ED went from neg to 0.29.  EDP felt she needed transfer due to no cardiology at their location.  Review of Systems: As mentioned in the history of present illness. All other systems reviewed and are negative. Past Medical History:  Diagnosis Date   Bright's disease age 35   issue resolved   Chronic pain    COPD (chronic obstructive pulmonary disease) (HCC)    Headache    Marijuana abuse    Nerve damage    to back and legs   Primary localized osteoarthrosis, lower leg    oa   Pulmonary nodules    Tobacco abuse    Wears dentures top   Wears glasses    Past Surgical History:  Procedure Laterality Date   ABDOMINAL HYSTERECTOMY  yrs ago   1 ovary left   ANTERIOR AND POSTERIOR REPAIR N/A 05/30/2021   Procedure: ANTERIOR (CYSTOCELE) AND POSTERIOR REPAIR (RECTOCELE);  Surgeon: Lavina Hamman, MD;  Location: [facility];  Service: Gynecology;  Laterality: N/A;   ANTERIOR CERVICAL DECOMP/DISCECTOMY FUSION N/A 09/10/2022   Procedure: Anterior Cervical Decompression Fusion - Cervical five-Cervical six;  Surgeon: Julio Sicks, MD;  Location: [facility] OR;  Service: Neurosurgery;   Laterality: N/A;   BACK SURGERY  yrs ago   x3 bolts  and screws and rods x 2 lower back   BREAST BIOPSY Left    COLPOCLEISIS N/A 03/22/2022   Procedure: EXAM UNDER ANESTHESIA, COLPOCLEISIS; PERINEORRHAPHY;  Surgeon: Marguerita Beards, MD;  Location: [facility];  Service: Gynecology;  Laterality: N/A;   CYSTOSCOPY N/A 03/22/2022   Procedure: CYSTOSCOPY;  Surgeon: Marguerita Beards, MD;  Location: [facility];  Service: Gynecology;  Laterality: N/A;   HEMORRHOID SURGERY  yrs ago   LEFT HEART CATH AND CORONARY ANGIOGRAPHY N/A 03/08/2022   Procedure: LEFT HEART CATH AND CORONARY ANGIOGRAPHY;  Surgeon: Kathleene Hazel, MD;  Location: [facility];  Service: Cardiovascular;  Laterality: N/A;   ROBOTIC ASSISTED LAPAROSCOPIC SACROCOLPOPEXY N/A 02/13/2022   Procedure: XI ROBOTIC ASSISTED LAPAROSCOPIC LYSIS OF ADHESIONS;  Surgeon: Marguerita Beards, MD;  Location: [facility];  Service: Gynecology;  Laterality: N/A;   TONSILLECTOMY     VAGINAL PROLAPSE REPAIR N/A 10/19/2021   Procedure: VAGINAL VAULT SUSPENSION;  Surgeon: Lavina Hamman, MD;  Location: [facility];  Service: Gynecology;  Laterality: N/A;   VULVAR LESION REMOVAL N/A 05/30/2021   Procedure: REMOVAL OF VULVAR CYST;  Surgeon: Lavina Hamman, MD;  Location: [facility] ;  Service: Gynecology;  Laterality: N/A;   Social History:  reports that she has been smoking cigarettes. She has a 20.00 pack-year smoking history. She has  never used smokeless tobacco. She reports that she does not drink alcohol and does not use drugs.  Allergies  Allergen Reactions   Bee Venom Anaphylaxis   Arthrotec [Diclofenac-Misoprostol] Other (See Comments)    Not sure   Codeine Itching    headache   Cymbalta [Duloxetine Hcl] Other (See Comments)    "felt I was sinking into the floor"   Effexor [Venlafaxine Hydrochloride] Other (See Comments)    confusion    Escitalopram Oxalate Other (See Comments)    confusion   Hydrocodone Other (See Comments)    Severe headache, itching   Neurontin [Gabapentin] Other (See Comments)    Not sure   Oxymorphone Hcl Other (See Comments)    "It didn't work"   Pentazocine Lactate Other (See Comments)    Not sure   Provigil [Modafinil] Other (See Comments)    Not sure   Sertraline Hcl Other (See Comments)    More depressed    Family History  Problem Relation Age of Onset   Stroke Mother    Hypertension Father    Breast cancer Paternal Aunt     Prior to Admission medications   Medication Sig Start Date End Date Taking? Authorizing Provider  acetaminophen (TYLENOL) 500 MG tablet Take 1 tablet (500 mg total) by mouth every 6 (six) hours as needed (pain). Patient taking differently: Take 500 mg by mouth every 6 (six) hours as needed for headache (pain). 03/01/22   Marguerita Beards, MD  albuterol (VENTOLIN HFA) 108 (90 Base) MCG/ACT inhaler Inhale 2 puffs into the lungs every 6 (six) hours as needed for wheezing or shortness of breath. 06/26/22   Byrum, Les Pou, MD  ALPRAZolam Prudy Feeler) 1 MG tablet Take 1 mg by mouth 3 (three) times daily.    [provider]  ascorbic acid (VITAMIN C) 500 MG tablet Take 500 mg by mouth daily.    [provider]  aspirin EC 81 MG tablet Take 81 mg by mouth at bedtime. Swallow whole.    [provider]  Cholecalciferol (D3 MAXIMUM STRENGTH) 125 MCG (5000 UT) capsule Take 5,000 Units by mouth daily.    [provider]  methocarbamol (ROBAXIN) 750 MG tablet Take 1 tablet (750 mg total) by mouth 3 (three) times daily. 09/11/22   Julio Sicks, MD  polyethylene glycol powder (GLYCOLAX/MIRALAX) 17 GM/SCOOP powder Take 17 g by mouth daily. Drink 17g (1 scoop) dissolved in water per day. Patient taking differently: Take 17 g by mouth daily. 03/01/22   Marguerita Beards, MD  pregabalin (LYRICA) 100 MG capsule Take 100 mg by mouth 3 (three) times daily.     [provider]  Tiotropium Bromide Monohydrate (SPIRIVA RESPIMAT) 2.5 MCG/ACT AERS Inhale 2 puffs into the lungs daily. 03/28/23   Leslye Peer, MD  traMADol (ULTRAM) 50 MG tablet Take 100 mg by mouth in the morning, at noon, and at bedtime.    [provider]    Physical Exam: Vitals:   05/26/23 0014  BP: 112/68  Pulse: 75  Resp: 11  Temp: 98.4 F (36.9 C)  SpO2: 95%  Weight: 72.2 kg  Height: 5\' 5"  (1.651 m)   Constitutional: NAD, calm, comfortable Respiratory: clear to auscultation bilaterally, no wheezing, no crackles. Normal respiratory effort. No accessory muscle use.  Cardiovascular: Regular rate and rhythm, no murmurs / rubs / gallops. No extremity edema. 2+ pedal pulses. No carotid bruits.  Abdomen: no tenderness, no masses palpated. No hepatosplenomegaly. Bowel sounds positive.  Neurologic: CN  2-12 grossly intact. Sensation intact, DTR normal. Strength 5/5 in all 4.  Psychiatric: Normal judgment and insight. Alert and oriented x 3. Normal mood.   Data Reviewed:  There are no new results to review at this time.  Assessment and Plan: * Syncope In setting of cocaine and amphetamine abuse. Syncope pathway 2d echo Check 3rd trop in AM Will defer to day team if they feel cards consult needed  Polysubstance abuse (HCC) Including cocaine and methamphetamine. Emphasized to pt that her heart wasn't able to take this and she needed to quit!  Chronic lower back pain Cont tramadol, lyrica.      Advance Care Planning:   Code Status: Full Code  Consults: None  Family Communication: No family in room  Severity of Illness: The appropriate patient status for this patient is OBSERVATION. Observation status is judged to be reasonable and necessary in order to provide the required intensity of service to ensure the patient's safety. The patient's presenting symptoms, physical exam findings, and initial radiographic and laboratory data in the context of  their medical condition is felt to place them at decreased risk for further clinical deterioration. Furthermore, it is anticipated that the patient will be medically stable for discharge from the hospital within 2 midnights of admission.   Author: Hillary Bow., DO 05/26/2023 12:41 AM  For on call review [email or web address].

## 2023-05-27 DIAGNOSIS — F1410 Cocaine abuse, uncomplicated: Secondary | ICD-10-CM | POA: Diagnosis not present

## 2023-05-27 DIAGNOSIS — R7989 Other specified abnormal findings of blood chemistry: Secondary | ICD-10-CM | POA: Diagnosis not present

## 2023-05-27 DIAGNOSIS — R55 Syncope and collapse: Secondary | ICD-10-CM | POA: Diagnosis not present

## 2023-05-27 DIAGNOSIS — F1910 Other psychoactive substance abuse, uncomplicated: Secondary | ICD-10-CM | POA: Diagnosis not present

## 2023-05-27 NOTE — Discharge Summary (Signed)
Physician Discharge Summary  Alyssa [name]:[id number] DOB: 1956/06/26 DOA: 05/25/2023  PCP: Geoffry Paradise, MD  Admit date: 05/25/2023 Discharge date: 05/27/2023  Admitted From: Home Disposition: Home  Recommendations for Outpatient Follow-up:  Follow up with PCP in 1-2 weeks Please obtain BMP/CBC/magnesium in one week   Discharge Condition: Stable CODE STATUS: Full code Diet recommendation: Low-salt diet  Discharge summary: 67 year old with history of chronic pain management on Xanax, Lyrica and tramadol who took her usual medications and also snorted cocaine, went to shower felt dizzy and wanted to cool off in the bathtub.  Family heard a big thud and patient was found in the bathtub with less responsiveness but intact vitals.  EMS was called.  Taken to emergency room in [place], [place].  Patient without chest pain but troponins elevated 0.04-0.6.  Given Lovenox.  Transferred [facility] evaluations.   Syncope, elevated troponins: demand ischemia.  Prescription drug use, Xanax Lyrica and tramadol Illicit drug use, cocaine and amphetamine as well cannabinoids. Currently stable. Troponins are mildly elevated and flat. Echo slight regional wall motion abnormalities. Without chest pain.  Tele with no evidence of arrhthymias.  Seen by cardiology, recommended no cardiac intervention. Continue aspirin and statin. 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 Very strong recommendations not to use recreational drugs and stimulant and patient agrees.   Chronic pain syndrome and prescription drug, illicit drug use: Counseled, she agrees that combination of her prescription meds and illicit drug use may be fatal. She is determined to quit.   Currently asymptomatic and chest pain free. Cleared by cardiology . Stable to go home with outpatient follow up.      Discharge Diagnoses:  Principal Problem:    Syncope Active Problems:   Polysubstance abuse (HCC)   Chronic lower back pain   Elevated troponin   Non-ST elevation (NSTEMI) myocardial infarction [facility])   Syncope and collapse    Discharge Instructions  Discharge Instructions     Diet - low sodium heart healthy   Complete by: As directed    Increase activity slowly   Complete by: As directed       Allergies as of 05/27/2023       Reactions   Bee Venom Anaphylaxis   Arthrotec [diclofenac-misoprostol] Other (See Comments)   Unknown reaction   Codeine Itching, Other (See Comments)   Headache    Cymbalta [duloxetine Hcl] Other (See Comments)   Altered mental state Confusion    Effexor [venlafaxine Hydrochloride] Other (See Comments)   Confusion    Hydrocodone Itching, Other (See Comments)   Severe headache   Lexapro [escitalopram Oxalate] Other (See Comments)   Confusion    Neurontin [gabapentin] Other (See Comments)   Unknown reaction   Opana [oxymorphone Hcl] Other (See Comments)   Not effective   Provigil [modafinil] Other (See Comments)   Unknown reaction   Talwin [pentazocine] Other (See Comments)   Unknown reaction   Zoloft [sertraline] Other (See Comments)   Increased depression        Medication List     TAKE these medications    acetaminophen 500 MG tablet Commonly known as: TYLENOL Take 1 tablet (500 mg total) by mouth every 6 (six) hours as needed (pain). What changed: reasons to take this   albuterol 108 (90 Base) MCG/ACT inhaler Commonly known as: VENTOLIN HFA Inhale 2 puffs into the lungs every 6 (six) hours as needed for wheezing or shortness of breath.  ALPRAZolam 1 MG tablet Commonly known as: XANAX Take 1 mg by mouth 3 (three) times daily as needed for anxiety.   aspirin EC 81 MG tablet Take 81 mg by mouth at bedtime.   meloxicam 15 MG tablet Commonly known as: MOBIC Take 15 mg by mouth daily.   methocarbamol 750 MG tablet Commonly known as: ROBAXIN Take 1 tablet (750 mg  total) by mouth 3 (three) times daily. What changed:  when to take this reasons to take this   polyethylene glycol powder 17 GM/SCOOP powder Commonly known as: GLYCOLAX/MIRALAX Take 17 g by mouth daily. Drink 17g (1 scoop) dissolved in water per day. What changed: additional instructions   pregabalin 100 MG capsule Commonly known as: LYRICA Take 100 mg by mouth 3 (three) times daily.   Spiriva Respimat 2.5 MCG/ACT Aers Generic drug: Tiotropium Bromide Monohydrate Inhale 2 puffs into the lungs daily.   traMADol 50 MG tablet Commonly known as: ULTRAM Take 100 mg by mouth in the morning, at noon, and at bedtime.   VITAMIN D-3 PO Take 1 capsule by mouth daily.        Allergies  Allergen Reactions   Bee Venom Anaphylaxis   Arthrotec [Diclofenac-Misoprostol] Other (See Comments)    Unknown reaction   Codeine Itching and Other (See Comments)    Headache    Cymbalta [Duloxetine Hcl] Other (See Comments)    Altered mental state Confusion    Effexor [Venlafaxine Hydrochloride] Other (See Comments)    Confusion    Hydrocodone Itching and Other (See Comments)    Severe headache   Lexapro [Escitalopram Oxalate] Other (See Comments)    Confusion    Neurontin [Gabapentin] Other (See Comments)    Unknown reaction   Opana [Oxymorphone Hcl] Other (See Comments)    Not effective   Provigil [Modafinil] Other (See Comments)    Unknown reaction   Talwin [Pentazocine] Other (See Comments)    Unknown reaction   Zoloft [Sertraline] Other (See Comments)    Increased depression    Consultations: Cardiology    Procedures/Studies: ECHOCARDIOGRAM COMPLETE  Result Date: 05/26/2023    ECHOCARDIOGRAM REPORT   Patient Name:   Alyssa [name] Date of Exam: 05/26/2023 Medical Rec #:  [id number]        Height:       65.0 in Accession #:    [id number]       Weight:       159.0 lb Date of Birth:  Oct 13, 1956         BSA:          1.794 m Patient Age:    67 years         BP:           129/80  mmHg Patient Gender: F                HR:           74 bpm. Exam Location:  Inpatient Procedure: 2D Echo, 3D Echo, Cardiac Doppler and Color Doppler Indications:    Syncope R55  History:        Patient has prior history of Echocardiogram examinations, most                 recent 03/09/2022. Previous Myocardial Infarction, COPD,                 Signs/Symptoms:Syncope; Risk Factors:Current Smoker.  Sonographer:    Dondra Prader RVT Referring Phys: [phone] JARED M GARDNER IMPRESSIONS  1. Distal septal and apical hypokinesis . Left ventricular ejection fraction, by estimation, is 50 to 55%. The left ventricle has low normal function. The left ventricle demonstrates regional wall motion abnormalities (see scoring diagram/findings for description). The left ventricular internal cavity size was mildly dilated. Left ventricular diastolic parameters were normal.  2. Right ventricular systolic function is normal. The right ventricular size is normal.  3. The mitral valve is normal in structure. No evidence of mitral valve regurgitation. No evidence of mitral stenosis.  4. The aortic valve is tricuspid. Aortic valve regurgitation is not visualized. No aortic stenosis is present.  5. The inferior vena cava is normal in size with greater than 50% respiratory variability, suggesting right atrial pressure of 3 mmHg. FINDINGS  Left Ventricle: Distal septal and apical hypokinesis. Left ventricular ejection fraction, by estimation, is 50 to 55%. The left ventricle has low normal function. The left ventricle demonstrates regional wall motion abnormalities. The left ventricular internal cavity size was mildly dilated. There is no left ventricular hypertrophy. Left ventricular diastolic parameters were normal. Right Ventricle: The right ventricular size is normal. No increase in right ventricular wall thickness. Right ventricular systolic function is normal. Left Atrium: Left atrial size was normal in size. Right Atrium: Right atrial size was  normal in size. Pericardium: There is no evidence of pericardial effusion. Mitral Valve: The mitral valve is normal in structure. No evidence of mitral valve regurgitation. No evidence of mitral valve stenosis. Tricuspid Valve: The tricuspid valve is normal in structure. Tricuspid valve regurgitation is mild . No evidence of tricuspid stenosis. Aortic Valve: The aortic valve is tricuspid. Aortic valve regurgitation is not visualized. No aortic stenosis is present. Aortic valve mean gradient measures 2.0 mmHg. Aortic valve peak gradient measures 4.7 mmHg. Aortic valve area, by VTI measures 2.83 cm. Pulmonic Valve: The pulmonic valve was normal in structure. Pulmonic valve regurgitation is not visualized. No evidence of pulmonic stenosis. Aorta: The aortic root is normal in size and structure. Venous: The inferior vena cava is normal in size with greater than 50% respiratory variability, suggesting right atrial pressure of 3 mmHg. IAS/Shunts: No atrial level shunt detected by color flow Doppler.  LEFT VENTRICLE PLAX 2D LVIDd:         4.10 cm   Diastology LVIDs:         2.30 cm   LV e' medial:    37.83 cm/s LV PW:         0.90 cm   LV E/e' medial:  1.8 LV IVS:        0.90 cm   LV e' lateral:   13.20 cm/s LVOT diam:     2.00 cm   LV E/e' lateral: 5.1 LV SV:         61 LV SV Index:   34 LVOT Area:     3.14 cm                           3D Volume EF:                          3D EF:        64 %                          LV EDV:       123 ml  LV ESV:       44 ml                          LV SV:        78 ml RIGHT VENTRICLE             IVC RV Basal diam:  3.10 cm     IVC diam: 1.70 cm RV Mid diam:    2.60 cm RV S prime:     13.90 cm/s TAPSE (M-mode): 2.6 cm LEFT ATRIUM           Index        RIGHT ATRIUM           Index LA diam:      3.30 cm 1.84 cm/m   RA Area:     14.10 cm LA Vol (A4C): 45.9 ml 25.58 ml/m  RA Volume:   31.80 ml  17.72 ml/m  AORTIC VALVE                    PULMONIC VALVE AV Area  (Vmax):    2.84 cm     PV Vmax:       0.62 m/s AV Area (Vmean):   2.73 cm     PV Peak grad:  1.5 mmHg AV Area (VTI):     2.83 cm AV Vmax:           108.00 cm/s AV Vmean:          69.900 cm/s AV VTI:            0.215 m AV Peak Grad:      4.7 mmHg AV Mean Grad:      2.0 mmHg LVOT Vmax:         97.50 cm/s LVOT Vmean:        60.800 cm/s LVOT VTI:          0.194 m LVOT/AV VTI ratio: 0.90  AORTA Ao Root diam: 2.90 cm Ao Asc diam:  3.35 cm MITRAL VALVE MV Area (PHT): 2.70 cm    SHUNTS MV Decel Time: 281 msec    Systemic VTI:  0.19 m MV E velocity: 67.10 cm/s  Systemic Diam: 2.00 cm MV A velocity: 80.20 cm/s MV E/A ratio:  0.84 Charlton Haws MD Electronically signed by Charlton Haws MD Signature Date/Time: 05/26/2023/4:42:09 PM    Final    (Echo, Carotid, EGD, Colonoscopy, ERCP)    Subjective: patient seen and examined. No issues , wants to go home.    Discharge Exam: Vitals:   05/27/23 0520 05/27/23 0829  BP: 121/68 (!) 152/81  Pulse: 72 64  Resp: 16 15  Temp: 97.9 F (36.6 C) 98 F (36.7 C)  SpO2:     Vitals:   05/26/23 2029 05/27/23 0200 05/27/23 0520 05/27/23 0829  BP: (!) 157/92  121/68 (!) 152/81  Pulse: 71  72 64  Resp: 14 14 16 15   Temp: 98.2 F (36.8 C)  97.9 F (36.6 C) 98 F (36.7 C)  TempSrc: Oral  Oral Oral  SpO2:      Weight:   71.8 kg   Height:        General: Pt is alert, awake, not in acute distress, flat affect Cardiovascular: RRR, S1/S2 +, no rubs, no gallops Respiratory: CTA bilaterally, no wheezing, no rhonchi Abdominal: Soft, NT, ND, bowel sounds + Extremities: no edema, no cyanosis    The results of significant diagnostics  from this hospitalization (including imaging, microbiology, ancillary and laboratory) are listed below for reference.     Microbiology: No results found for this or any previous visit (from the past 240 hour(s)).   Labs: BNP (last 3 results) No results for input(s): "BNP" in the last 8760 hours. Basic Metabolic Panel: Recent Labs   Lab 05/26/23 1328  NA 138  K 3.7  CL 102  CO2 28  GLUCOSE 119*  BUN 12  CREATININE 0.75  CALCIUM 8.7*  MG 2.4  PHOS 2.4*   Liver Function Tests: Recent Labs  Lab 05/26/23 1328  AST 20  ALT 9  ALKPHOS 62  BILITOT 0.6  PROT 6.0*  ALBUMIN 3.3*   No results for input(s): "LIPASE", "AMYLASE" in the last 168 hours. No results for input(s): "AMMONIA" in the last 168 hours. CBC: Recent Labs  Lab 05/26/23 1328  WBC 5.9  NEUTROABS 4.9  HGB 13.1  HCT 40.6  MCV 90.0  PLT 227   Cardiac Enzymes: No results for input(s): "CKTOTAL", "CKMB", "CKMBINDEX", "TROPONINI" in the last 168 hours. BNP: Invalid input(s): "POCBNP" CBG: No results for input(s): "GLUCAP" in the last 168 hours. D-Dimer No results for input(s): "DDIMER" in the last 72 hours. Hgb A1c No results for input(s): "HGBA1C" in the last 72 hours. Lipid Profile No results for input(s): "CHOL", "HDL", "LDLCALC", "TRIG", "CHOLHDL", "LDLDIRECT" in the last 72 hours. Thyroid function studies No results for input(s): "TSH", "T4TOTAL", "T3FREE", "THYROIDAB" in the last 72 hours.  Invalid input(s): "FREET3" Anemia work up No results for input(s): "VITAMINB12", "FOLATE", "FERRITIN", "TIBC", "IRON", "RETICCTPCT" in the last 72 hours. Urinalysis    Component Value Date/Time   COLORURINE YELLOW 01/10/2022 1959   APPEARANCEUR CLEAR 01/10/2022 1959   LABSPEC 1.015 01/10/2022 1959   PHURINE 6.0 01/10/2022 1959   GLUCOSEU NEGATIVE 01/10/2022 1959   HGBUR TRACE (A) 01/10/2022 1959   BILIRUBINUR Negative 01/11/2022 0815   KETONESUR NEGATIVE 01/10/2022 1959   PROTEINUR Negative 01/11/2022 0815   PROTEINUR NEGATIVE 01/10/2022 1959   UROBILINOGEN 0.2 01/11/2022 0815   NITRITE Negative 01/11/2022 0815   NITRITE NEGATIVE 01/10/2022 1959   LEUKOCYTESUR Negative 01/11/2022 0815   LEUKOCYTESUR NEGATIVE 01/10/2022 1959   Sepsis Labs Recent Labs  Lab 05/26/23 1328  WBC 5.9   Microbiology No results found for this or  any previous visit (from the past 240 hour(s)).   Time coordinating discharge:  32 minutes  SIGNED:   Dorcas Carrow, MD  [facility] 05/27/2023, 11:12 AM

## 2023-05-27 NOTE — TOC Transition Note (Signed)
Transition of Care [facility]) - CM/SW Discharge Note   Patient Details  Name: Alyssa [name] MRN: [id number] Date of Birth: 31-Aug-1956  [facility]) CM/SW Contact:  Harriet Masson, RN Phone Number: 05/27/2023, 12:00 PM   Clinical Narrative:     Patient stable for discharge.  No TOC needs at this time. Final next level of care: Home/Self Care Barriers to Discharge: Barriers Resolved   Patient Goals and CMS Choice      Discharge Placement                         Discharge Plan and Services Additional resources added to the After Visit Summary for                                       Social Determinants of Health (SDOH) Interventions SDOH Screenings   Food Insecurity: No Food Insecurity (05/26/2023)  Housing: Low Risk  (05/26/2023)  Transportation Needs: No Transportation Needs (05/26/2023)  Utilities: Patient Declined (05/26/2023)  Tobacco Use: High Risk (03/28/2023)     Readmission Risk Interventions    05/27/2023   11:59 AM  Readmission Risk Prevention Plan  Post Dischage Appt Complete  Medication Screening Complete  Transportation Screening Complete

## 2023-05-27 NOTE — Progress Notes (Signed)
Progress Note  Patient Name: Alyssa [name] Date of Encounter: 05/27/2023  Primary Cardiologist: None   Subjective   No additional chest pain. No sob.   Inpatient Medications    Scheduled Meds:  aspirin EC  81 mg Oral QHS   enoxaparin (LOVENOX) injection  40 mg Subcutaneous Q24H   pregabalin  100 mg Oral TID   sodium chloride flush  3 mL Intravenous Q12H   umeclidinium bromide  1 puff Inhalation Daily   Continuous Infusions:  PRN Meds: acetaminophen **OR** acetaminophen, albuterol, ALPRAZolam, ondansetron **OR** ondansetron (ZOFRAN) IV, traMADol   Vital Signs    Vitals:   05/26/23 2029 05/27/23 0200 05/27/23 0520 05/27/23 0829  BP: (!) 157/92  121/68 (!) 152/81  Pulse: 71  72 64  Resp: 14 14 16 15   Temp: 98.2 F (36.8 C)  97.9 F (36.6 C) 98 F (36.7 C)  TempSrc: Oral  Oral Oral  SpO2:      Weight:   71.8 kg   Height:       No intake or output data in the 24 hours ending 05/27/23 0842 Filed Weights   05/26/23 0014 05/26/23 0347 05/27/23 0520  Weight: 72.2 kg 72.1 kg 71.8 kg    Telemetry    nsr - Personally Reviewed  ECG    NSR with diffuse T wave abnormality and QT prolongation. - Personally Reviewed  Physical Exam   GEN: No acute distress.   Neck: No JVD Cardiac: RRR, no murmurs, rubs, or gallops.  Respiratory: Clear to auscultation bilaterally. GI: Soft, nontender, non-distended  MS: No edema; No deformity. Neuro:  Nonfocal  Psych: Normal affect   Labs    Chemistry Recent Labs  Lab 05/26/23 1328  NA 138  K 3.7  CL 102  CO2 28  GLUCOSE 119*  BUN 12  CREATININE 0.75  CALCIUM 8.7*  PROT 6.0*  ALBUMIN 3.3*  AST 20  ALT 9  ALKPHOS 62  BILITOT 0.6  GFRNONAA >60  ANIONGAP 8     Hematology Recent Labs  Lab 05/26/23 1328  WBC 5.9  RBC 4.51  HGB 13.1  HCT 40.6  MCV 90.0  MCH 29.0  MCHC 32.3  RDW 12.5  PLT 227    Cardiac EnzymesNo results for input(s): "TROPONINI" in the last 168 hours. No results for input(s):  "TROPIPOC" in the last 168 hours.   BNPNo results for input(s): "BNP", "PROBNP" in the last 168 hours.   DDimer No results for input(s): "DDIMER" in the last 168 hours.   Radiology    ECHOCARDIOGRAM COMPLETE  Result Date: 05/26/2023    ECHOCARDIOGRAM REPORT   Patient Name:   Alyssa [name] Date of Exam: 05/26/2023 Medical Rec #:  [id number]        Height:       65.0 in Accession #:    [id number]       Weight:       159.0 lb Date of Birth:  13-Dec-1956         BSA:          1.794 m Patient Age:    67 years         BP:           129/80 mmHg Patient Gender: F                HR:           74 bpm. Exam Location:  Inpatient Procedure: 2D Echo, 3D Echo, Cardiac Doppler and  Color Doppler Indications:    Syncope R55  History:        Patient has prior history of Echocardiogram examinations, most                 recent 03/09/2022. Previous Myocardial Infarction, COPD,                 Signs/Symptoms:Syncope; Risk Factors:Current Smoker.  Sonographer:    Dondra Prader RVT Referring Phys: [id number] JARED M GARDNER IMPRESSIONS  1. Distal septal and apical hypokinesis . Left ventricular ejection fraction, by estimation, is 50 to 55%. The left ventricle has low normal function. The left ventricle demonstrates regional wall motion abnormalities (see scoring diagram/findings for description). The left ventricular internal cavity size was mildly dilated. Left ventricular diastolic parameters were normal.  2. Right ventricular systolic function is normal. The right ventricular size is normal.  3. The mitral valve is normal in structure. No evidence of mitral valve regurgitation. No evidence of mitral stenosis.  4. The aortic valve is tricuspid. Aortic valve regurgitation is not visualized. No aortic stenosis is present.  5. The inferior vena cava is normal in size with greater than 50% respiratory variability, suggesting right atrial pressure of 3 mmHg. FINDINGS  Left Ventricle: Distal septal and apical hypokinesis. Left ventricular  ejection fraction, by estimation, is 50 to 55%. The left ventricle has low normal function. The left ventricle demonstrates regional wall motion abnormalities. The left ventricular internal cavity size was mildly dilated. There is no left ventricular hypertrophy. Left ventricular diastolic parameters were normal. Right Ventricle: The right ventricular size is normal. No increase in right ventricular wall thickness. Right ventricular systolic function is normal. Left Atrium: Left atrial size was normal in size. Right Atrium: Right atrial size was normal in size. Pericardium: There is no evidence of pericardial effusion. Mitral Valve: The mitral valve is normal in structure. No evidence of mitral valve regurgitation. No evidence of mitral valve stenosis. Tricuspid Valve: The tricuspid valve is normal in structure. Tricuspid valve regurgitation is mild . No evidence of tricuspid stenosis. Aortic Valve: The aortic valve is tricuspid. Aortic valve regurgitation is not visualized. No aortic stenosis is present. Aortic valve mean gradient measures 2.0 mmHg. Aortic valve peak gradient measures 4.7 mmHg. Aortic valve area, by VTI measures 2.83 cm. Pulmonic Valve: The pulmonic valve was normal in structure. Pulmonic valve regurgitation is not visualized. No evidence of pulmonic stenosis. Aorta: The aortic root is normal in size and structure. Venous: The inferior vena cava is normal in size with greater than 50% respiratory variability, suggesting right atrial pressure of 3 mmHg. IAS/Shunts: No atrial level shunt detected by color flow Doppler.  LEFT VENTRICLE PLAX 2D LVIDd:         4.10 cm   Diastology LVIDs:         2.30 cm   LV e' medial:    37.83 cm/s LV PW:         0.90 cm   LV E/e' medial:  1.8 LV IVS:        0.90 cm   LV e' lateral:   13.20 cm/s LVOT diam:     2.00 cm   LV E/e' lateral: 5.1 LV SV:         61 LV SV Index:   34 LVOT Area:     3.14 cm  3D Volume EF:                          3D  EF:        64 %                          LV EDV:       123 ml                          LV ESV:       44 ml                          LV SV:        78 ml RIGHT VENTRICLE             IVC RV Basal diam:  3.10 cm     IVC diam: 1.70 cm RV Mid diam:    2.60 cm RV S prime:     13.90 cm/s TAPSE (M-mode): 2.6 cm LEFT ATRIUM           Index        RIGHT ATRIUM           Index LA diam:      3.30 cm 1.84 cm/m   RA Area:     14.10 cm LA Vol (A4C): 45.9 ml 25.58 ml/m  RA Volume:   31.80 ml  17.72 ml/m  AORTIC VALVE                    PULMONIC VALVE AV Area (Vmax):    2.84 cm     PV Vmax:       0.62 m/s AV Area (Vmean):   2.73 cm     PV Peak grad:  1.5 mmHg AV Area (VTI):     2.83 cm AV Vmax:           108.00 cm/s AV Vmean:          69.900 cm/s AV VTI:            0.215 m AV Peak Grad:      4.7 mmHg AV Mean Grad:      2.0 mmHg LVOT Vmax:         97.50 cm/s LVOT Vmean:        60.800 cm/s LVOT VTI:          0.194 m LVOT/AV VTI ratio: 0.90  AORTA Ao Root diam: 2.90 cm Ao Asc diam:  3.35 cm MITRAL VALVE MV Area (PHT): 2.70 cm    SHUNTS MV Decel Time: 281 msec    Systemic VTI:  0.19 m MV E velocity: 67.10 cm/s  Systemic Diam: 2.00 cm MV A velocity: 80.20 cm/s MV E/A ratio:  0.84 Charlton Haws MD Electronically signed by Charlton Haws MD Signature Date/Time: 05/26/2023/4:42:09 PM    Final     Cardiac Studies   nsr  Patient Profile     67 y.o. female admitted with syncope and chest pain in the setting of active polysubstance abuse.  Assessment & Plan    Polysubstance abuse - I encouraged her not to continue using recreational drugs.  Elevated troponin - she has no additional chest pain.  Syncope - likely not due to VT as she was said to awake in the ambulance and was not treated. Disp - Ok  to DC home from my perspective. I encouraged her to stop using drugs.  [facility] will sign off.   Medication Recommendations:  stop using drugs Other recommendations (labs, testing, etc):  none Follow up as an  outpatient:  none  For questions or updates, please contact [facility] Please consult [email or web address] for contact info under Cardiology/STEMI.      Signed, Lewayne Bunting, MD  05/27/2023, 8:42 AM

## 2023-05-28 NOTE — Telephone Encounter (Signed)
Ive rescheduled and notified patient

## 2023-06-24 ENCOUNTER — Ambulatory Visit (HOSPITAL_COMMUNITY)
Admission: RE | Admit: 2023-06-24 | Discharge: 2023-06-24 | Disposition: A | Discharge status: Home / Self Care | Payer: Medicare Other | Source: Ambulatory Visit | Attending: Emergency Medicine | Admitting: Emergency Medicine

## 2023-06-24 ENCOUNTER — Other Ambulatory Visit: Payer: Medicare Other

## 2023-06-24 DIAGNOSIS — J449 Chronic obstructive pulmonary disease, unspecified: Secondary | ICD-10-CM | POA: Diagnosis present

## 2023-07-01 ENCOUNTER — Ambulatory Visit: Payer: Medicare Other | Admitting: Internal Medicine

## 2023-07-17 ENCOUNTER — Ambulatory Visit: Payer: Medicare Other | Admitting: Emergency Medicine

## 2023-07-17 ENCOUNTER — Encounter: Payer: Self-pay | Admitting: Emergency Medicine

## 2023-07-17 VITALS — BP 130/84 | HR 77 | Temp 98.1°F | Ht 64.0 in | Wt 166.2 lb

## 2023-07-17 DIAGNOSIS — R918 Other nonspecific abnormal finding of lung field: Secondary | ICD-10-CM | POA: Diagnosis not present

## 2023-07-17 DIAGNOSIS — J449 Chronic obstructive pulmonary disease, unspecified: Secondary | ICD-10-CM

## 2023-07-17 DIAGNOSIS — Z720 Tobacco use: Secondary | ICD-10-CM

## 2023-07-17 MED ORDER — SPIRIVA RESPIMAT 2.5 MCG/ACT IN AERS: 2.0000 | INHALATION_SPRAY | Freq: Every day | RESPIRATORY_TRACT | Status: DC

## 2023-07-17 NOTE — Addendum Note (Signed)
Addended by: Dorisann Frames R on: 07/17/2023 03:49 PM   Modules accepted: Orders

## 2023-07-17 NOTE — Patient Instructions (Addendum)
We reviewed your CT scan of the chest today.  Pulmonary nodules are either resolved or unchanged.  This is good news. We will get you back into the lung cancer screening program with plan to repeat your CT chest in June 2025 Continue to work on decreasing your cigarettes.  Ultimate goal will be to stop altogether. Start taking your Spiriva once daily every day as a maintenance medication. Keep albuterol available use 2 puffs when needed for shortness of breath, chest tightness, wheezing. Follow Dr. Delton Coombes in June 2025 or sooner if you have any problems.

## 2023-07-17 NOTE — Assessment & Plan Note (Signed)
Start taking your Spiriva once daily every day as a maintenance medication. Keep albuterol available use 2 puffs when needed for shortness of breath, chest tightness, wheezing. Follow Dr. Delton Coombes in June 2025 or sooner if you have any problems.

## 2023-07-17 NOTE — Addendum Note (Signed)
Addended by: Dorisann Frames R on: 07/17/2023 03:43 PM   Modules accepted: Orders

## 2023-07-17 NOTE — Assessment & Plan Note (Signed)
We reviewed your CT scan of the chest today.  Pulmonary nodules are either resolved or unchanged.  This is good news.

## 2023-07-17 NOTE — Assessment & Plan Note (Signed)
We will get you back into the lung cancer screening program with plan to repeat your CT chest in June 2025 Continue to work on decreasing your cigarettes.  Ultimate goal will be to stop altogether.
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
## 2023-08-09 ENCOUNTER — Ambulatory Visit: Payer: Medicare Other | Admitting: Internal Medicine

## 2023-08-29 ENCOUNTER — Emergency Department (HOSPITAL_COMMUNITY): Payer: Medicare Other

## 2023-08-29 ENCOUNTER — Other Ambulatory Visit: Payer: Self-pay

## 2023-08-29 ENCOUNTER — Emergency Department (HOSPITAL_COMMUNITY)
Admission: EM | Admit: 2023-08-29 | Discharge: 2023-08-29 | Disposition: A | Discharge status: Home / Self Care | Payer: Medicare Other | Attending: Emergency Medicine | Admitting: Emergency Medicine

## 2023-08-29 ENCOUNTER — Encounter (HOSPITAL_COMMUNITY): Payer: Self-pay

## 2023-08-29 DIAGNOSIS — Y92410 Unspecified street and highway as the place of occurrence of the external cause: Secondary | ICD-10-CM | POA: Diagnosis not present

## 2023-08-29 DIAGNOSIS — M79601 Pain in right arm: Secondary | ICD-10-CM | POA: Diagnosis present

## 2023-08-29 DIAGNOSIS — Z7982 Long term (current) use of aspirin: Secondary | ICD-10-CM | POA: Diagnosis not present

## 2023-08-29 DIAGNOSIS — M549 Dorsalgia, unspecified: Secondary | ICD-10-CM | POA: Diagnosis not present

## 2023-08-29 MED ORDER — KETOROLAC TROMETHAMINE 15 MG/ML IJ SOLN
15.0000 mg | Freq: Once | INTRAMUSCULAR | Status: AC
  Administered 2023-08-29: 15 mg via INTRAMUSCULAR
  Filled 2023-08-29: qty 1
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
## 2023-09-13 ENCOUNTER — Telehealth: Payer: Self-pay

## 2023-09-13 NOTE — Telephone Encounter (Signed)
Fax received from Dr. Julio Sicks with [facility] (fax- [phone]) to perform a L2-3 Lumbar Fusion on patient.  Patient needs surgery clearance. Surgery is pending. Patient was seen on 07/17/23. Office protocol is a risk assessment can be sent to surgeon if patient has been seen in 60 days or less.   Sending to Dr Delton Coombes for risk assessment or recommendations if patient needs to be seen in office prior to surgical procedure.

## 2023-09-16 ENCOUNTER — Other Ambulatory Visit: Payer: Self-pay | Admitting: Neurosurgery

## 2023-09-17 NOTE — Telephone Encounter (Signed)
I signed an addendum to her note from 07/17/2023 to identify her as moderate risk for general anesthesia.  Thank you

## 2023-09-19 NOTE — Progress Notes (Signed)
Surgical Instructions    Your procedure is scheduled on Tuesday, 10/01/23.  Report to [facility] Main Entrance "A" at 8:30 A.M., then check in with the Admitting office.  Call this number if you have problems the morning of surgery:  [phone]   If you have any questions prior to your surgery date call [phone]: Open Monday-Friday 8am-4pm If you experience any cold or flu symptoms such as cough, fever, chills, shortness of breath, etc. between now and your scheduled surgery, please notify us at the above number     Remember:  Do not eat or drink after midnight the night before your surgery     Take these medicines the morning of surgery with A SIP OF WATER:  ALPRAZolam (XANAX)  loratadine  pregabalin (LYRICA)  traMADol (ULTRAM)   IF NEEDED: acetaminophen (TYLENOL)  albuterol (VENTOLIN HFA) inhaler- bring with you the day of surgery methocarbamol (ROBAXIN)   As of today, STOP taking any Aspirin (unless otherwise instructed by your surgeon) Aleve, Naproxen, Ibuprofen, Motrin, Advil, Goody's, BC's, all herbal medications, fish oil, and all vitamins. This also includes your meloxicam (MOBIC).            Do not wear jewelry or makeup. Do not wear lotions, powders, perfumes/cologne or deodorant. Do not shave 48 hours prior to surgery.   Do not bring valuables to the hospital. Do not wear nail polish, gel polish, artificial nails, or any other type of covering on natural nails (fingers and toes) If you have artificial nails or gel coating that need to be removed by a nail salon, please have this removed prior to surgery. Artificial nails or gel coating may interfere with anesthesia's ability to adequately monitor your vital signs.  [facility] is not responsible for any belongings or valuables.    Do NOT Smoke (Tobacco/Vaping)  24 hours prior to your procedure  If you use a CPAP at night, you may bring your mask for your overnight stay.   Contacts, glasses, hearing aids,  dentures or partials may not be worn into surgery, please bring cases for these belongings   For patients admitted to the hospital, discharge time will be determined by your treatment team.   Patients discharged the day of surgery will not be allowed to drive home, and someone needs to stay with them for 24 hours.   SURGICAL WAITING ROOM VISITATION Patients having surgery or a procedure may have no more than 2 support people in the waiting area - these visitors may rotate.   Children under the age of 55 must have an adult with them who is not the patient. If the patient needs to stay at the hospital during part of their recovery, the visitor guidelines for inpatient rooms apply. Pre-op nurse will coordinate an appropriate time for 1 support person to accompany patient in pre-op.  This support person may not rotate.   Please refer to [email or web address] for the visitor guidelines for Inpatients (after your surgery is over and you are in a regular room).      Pre-operative 5 CHG Bathing Instructions   You can play a key role in reducing the risk of infection after surgery. Your skin needs to be as free of germs as possible. You can reduce the number of germs on your skin by washing with CHG (chlorhexidine gluconate) soap before surgery. CHG is an antiseptic soap that kills germs and continues to kill germs even after washing.   DO NOT use if you have an allergy to  chlorhexidine/CHG or antibacterial soaps. If your skin becomes reddened or irritated, stop using the CHG and notify one of our RNs at [phone].   Please shower with the CHG soap starting 4 days before surgery using the following schedule:     Please keep in mind the following:  DO NOT shave, including legs and underarms, starting the day of your first shower.   You may shave your face at any point before/day of surgery.  Place clean sheets on your bed the day you start using  CHG soap. Use a clean washcloth (not used since being washed) for each shower. DO NOT sleep with pets once you start using the CHG.   CHG Shower Instructions:  Wash your face and private area with normal soap. If you choose to wash your hair, wash first with your normal shampoo.  After you use shampoo/soap, rinse your hair and body thoroughly to remove shampoo/soap residue.  Turn the water OFF and apply about 3 tablespoons (45 ml) of CHG soap to a CLEAN washcloth.  Apply CHG soap ONLY FROM YOUR NECK DOWN TO YOUR TOES (washing for 3-5 minutes)  DO NOT use CHG soap on face, private areas, open wounds, or sores.  Pay special attention to the area where your surgery is being performed.  If you are having back surgery, having someone wash your back for you may be helpful. Wait 2 minutes after CHG soap is applied, then you may rinse off the CHG soap.  Pat dry with a clean towel  Put on clean clothes/pajamas   If you choose to wear lotion, please use ONLY the CHG-compatible lotions on the back of this paper.   Additional instructions for the day of surgery: DO NOT APPLY any lotions, deodorants, cologne, or perfumes.   Do not bring valuables to the hospital. [facility] is not responsible for any belongings/valuables. Do not wear nail polish, gel polish, artificial nails, or any other type of covering on natural nails (fingers and toes) Do not wear jewelry or makeup Put on clean/comfortable clothes.  Please brush your teeth.  Ask your nurse before applying any prescription medications to the skin.     CHG Compatible Lotions   Aveeno Moisturizing lotion  Cetaphil Moisturizing Cream  Cetaphil Moisturizing Lotion  Clairol Herbal Essence Moisturizing Lotion, Dry Skin  Clairol Herbal Essence Moisturizing Lotion, Extra Dry Skin  Clairol Herbal Essence Moisturizing Lotion, Normal Skin  Curel Age Defying Therapeutic Moisturizing Lotion with Alpha Hydroxy  Curel Extreme Care Body Lotion  Curel  Soothing Hands Moisturizing Hand Lotion  Curel Therapeutic Moisturizing Cream, Fragrance-Free  Curel Therapeutic Moisturizing Lotion, Fragrance-Free  Curel Therapeutic Moisturizing Lotion, Original Formula  Eucerin Daily Replenishing Lotion  Eucerin Dry Skin Therapy Plus Alpha Hydroxy Crme  Eucerin Dry Skin Therapy Plus Alpha Hydroxy Lotion  Eucerin Original Crme  Eucerin Original Lotion  Eucerin Plus Crme Eucerin Plus Lotion  Eucerin TriLipid Replenishing Lotion  Keri Anti-Bacterial Hand Lotion  Keri Deep Conditioning Original Lotion Dry Skin Formula Softly Scented  Keri Deep Conditioning Original Lotion, Fragrance Free Sensitive Skin Formula  Keri Lotion Fast Absorbing Fragrance Free Sensitive Skin Formula  Keri Lotion Fast Absorbing Softly Scented Dry Skin Formula  Keri Original Lotion  Keri Skin Renewal Lotion Keri Silky Smooth Lotion  Keri Silky Smooth Sensitive Skin Lotion  Nivea Body Creamy Conditioning Oil  Nivea Body Extra Enriched Teacher, adult education Moisturizing Lotion Nivea Crme  Nivea Skin Firming Lotion  NutraDerm 30  Skin Lotion  NutraDerm Skin Lotion  NutraDerm Therapeutic Skin Cream  NutraDerm Therapeutic Skin Lotion  ProShield Protective Hand Cream  Provon moisturizing lotion  Please read over the following fact sheets that you were given.

## 2023-09-20 ENCOUNTER — Encounter (HOSPITAL_COMMUNITY)
Admission: RE | Admit: 2023-09-20 | Discharge: 2023-09-20 | Disposition: A | Discharge status: Home / Self Care | Payer: Medicare Other | Source: Ambulatory Visit | Attending: Neurosurgery | Admitting: Neurosurgery

## 2023-09-20 ENCOUNTER — Encounter (HOSPITAL_COMMUNITY): Payer: Self-pay

## 2023-09-20 ENCOUNTER — Other Ambulatory Visit: Payer: Self-pay

## 2023-09-20 VITALS — BP 146/87 | HR 68 | Temp 98.2°F | Resp 18 | Ht 65.0 in | Wt 169.6 lb

## 2023-09-20 DIAGNOSIS — Z01812 Encounter for preprocedural laboratory examination: Secondary | ICD-10-CM | POA: Diagnosis not present

## 2023-09-20 DIAGNOSIS — Z01818 Encounter for other preprocedural examination: Secondary | ICD-10-CM

## 2023-09-20 LAB — CBC
HCT: 42.9 % (ref 36.0–46.0)
Hemoglobin: 13.9 g/dL (ref 12.0–15.0)
MCH: 28.8 pg (ref 26.0–34.0)
MCHC: 32.4 g/dL (ref 30.0–36.0)
MCV: 88.8 fL (ref 80.0–100.0)
Platelets: 258 10*3/uL (ref 150–400)
RBC: 4.83 MIL/uL (ref 3.87–5.11)
RDW: 12.5 % (ref 11.5–15.5)
WBC: 5.1 10*3/uL (ref 4.0–10.5)
nRBC: 0 % (ref 0.0–0.2)

## 2023-09-20 LAB — SURGICAL PCR SCREEN
MRSA, PCR: NEGATIVE
Staphylococcus aureus: NEGATIVE

## 2023-09-20 LAB — BASIC METABOLIC PANEL
Anion gap: 10 (ref 5–15)
BUN: 10 mg/dL (ref 8–23)
CO2: 24 mmol/L (ref 22–32)
Calcium: 8.9 mg/dL (ref 8.9–10.3)
Chloride: 103 mmol/L (ref 98–111)
Creatinine, Ser: 0.71 mg/dL (ref 0.44–1.00)
GFR, Estimated: >60 mL/min (ref >60)
Glucose, Bld: 85 mg/dL (ref 70–99)
Potassium: 4.2 mmol/L (ref 3.5–5.1)
Sodium: 137 mmol/L (ref 135–145)

## 2023-09-20 LAB — TYPE AND SCREEN
ABO/RH(D): A POS
Antibody Screen: NEGATIVE

## 2023-09-20 NOTE — Progress Notes (Signed)
Surgical Instructions    Your procedure is scheduled on October 1,2024.  Report to [facility] Main Entrance "A" at 8:30 A.M., then check in with the Admitting office.  Call this number if you have problems the morning of surgery:  [phone]  If you have any questions prior to your surgery date call [phone]: Open Monday-Friday 8am-4pm If you experience any cold or flu symptoms such as cough, fever, chills, shortness of breath, etc. between now and your scheduled surgery, please notify us at the above number.  This includes your meloxicam (MOBIC) .    Remember:  Do not eat or drink after midnight the night before your surgery     Take these medicines the morning of surgery with A SIP OF WATER  ALPRAZolam (XANAX)  pregabalin (LYRICA)  IF NEEDED acetaminophen (TYLENOL)  albuterol (VENTOLIN HFA)  methocarbamol (ROBAXIN)   traMADol (ULTRAM)   As of today, STOP taking any Aspirin (unless otherwise instructed by your surgeon) Aleve, Naproxen, Ibuprofen, Motrin, Advil, Goody's, BC's, all herbal medications, fish oil, and all vitamins. Follow instructions for Aspirin given to you by surgeon.                     Do NOT Smoke (Tobacco/Vaping) for 24 hours prior to your procedure.  If you use a CPAP at night, you may bring your mask/headgear for your overnight stay.   Contacts, glasses, piercing's, hearing aid's, dentures or partials may not be worn into surgery, please bring cases for these belongings.    For patients admitted to the hospital, discharge time will be determined by your treatment team.   Patients discharged the day of surgery will not be allowed to drive home, and someone needs to stay with them for 24 hours.  SURGICAL WAITING ROOM VISITATION Patients having surgery or a procedure may have no more than 2 support people in the waiting area - these visitors may rotate.   Children under the age of 79 must have an adult with them who is not the patient. If the patient  needs to stay at the hospital during part of their recovery, the visitor guidelines for inpatient rooms apply. Pre-op nurse will coordinate an appropriate time for 1 support person to accompany patient in pre-op.  This support person may not rotate.   Please refer to the [facility] website for the visitor guidelines for Inpatients (after your surgery is over and you are in a regular room).       Pre-operative 5 CHG Bath Instructions   You can play a key role in reducing the risk of infection after surgery. Your skin needs to be as free of germs as possible. You can reduce the number of germs on your skin by washing with CHG (chlorhexidine gluconate) soap before surgery. CHG is an antiseptic soap that kills germs and continues to kill germs even after washing.   DO NOT use if you have an allergy to chlorhexidine/CHG or antibacterial soaps. If your skin becomes reddened or irritated, stop using the CHG and notify one of our RNs at [phone].   Please shower with the CHG soap starting 4 days before surgery using the following schedule:     Please keep in mind the following:  DO NOT shave, including legs and underarms, starting the day of your first shower.   You may shave your face at any point before/day of surgery.  Place clean sheets on your bed the day you start using CHG soap. Use a  clean washcloth (not used since being washed) for each shower. DO NOT sleep with pets once you start using the CHG.   CHG Shower Instructions:  If you choose to wash your hair and private area, wash first with your normal shampoo/soap.  After you use shampoo/soap, rinse your hair and body thoroughly to remove shampoo/soap residue.  Turn the water OFF and apply about 3 tablespoons (45 ml) of CHG soap to a CLEAN washcloth.  Apply CHG soap ONLY FROM YOUR NECK DOWN TO YOUR TOES (washing for 3-5 minutes)  DO NOT use CHG soap on face, private areas, open wounds, or sores.  Pay special attention to the area  where your surgery is being performed.  If you are having back surgery, having someone wash your back for you may be helpful. Wait 2 minutes after CHG soap is applied, then you may rinse off the CHG soap.  Pat dry with a clean towel  Put on clean clothes/pajamas   If you choose to wear lotion, please use ONLY the CHG-compatible lotions on the back of this paper.     Additional instructions for the day of surgery: DO NOT APPLY any lotions, deodorants, cologne, or perfumes.   Do not wear jewelry or makeup Do not wear lotions, powders, perfumes/colognes, or deodorant. Do not shave 48 hours prior to surgery.  Men may shave face and neck. Do not bring valuables to the hospital.  [facility] is not responsible for any belongings or valuables. Do not wear nail polish, gel polish, artificial nails, or any other type of covering on natural nails (fingers and toes) If you have artificial nails or gel coating that need to be removed by a nail salon, please have this removed prior to surgery. Artificial nails or gel coating may interfere with anesthesia's ability to adequately monitor your vital signs. Put on clean/comfortable clothes.  Brush your teeth.  Ask your nurse before applying any prescription medications to the skin.      CHG Compatible Lotions   Aveeno Moisturizing lotion  Cetaphil Moisturizing Cream  Cetaphil Moisturizing Lotion  Clairol Herbal Essence Moisturizing Lotion, Dry Skin  Clairol Herbal Essence Moisturizing Lotion, Extra Dry Skin  Clairol Herbal Essence Moisturizing Lotion, Normal Skin  Curel Age Defying Therapeutic Moisturizing Lotion with Alpha Hydroxy  Curel Extreme Care Body Lotion  Curel Soothing Hands Moisturizing Hand Lotion  Curel Therapeutic Moisturizing Cream, Fragrance-Free  Curel Therapeutic Moisturizing Lotion, Fragrance-Free  Curel Therapeutic Moisturizing Lotion, Original Formula  Eucerin Daily Replenishing Lotion  Eucerin Dry Skin Therapy Plus Alpha  Hydroxy Crme  Eucerin Dry Skin Therapy Plus Alpha Hydroxy Lotion  Eucerin Original Crme  Eucerin Original Lotion  Eucerin Plus Crme Eucerin Plus Lotion  Eucerin TriLipid Replenishing Lotion  Keri Anti-Bacterial Hand Lotion  Keri Deep Conditioning Original Lotion Dry Skin Formula Softly Scented  Keri Deep Conditioning Original Lotion, Fragrance Free Sensitive Skin Formula  Keri Lotion Fast Absorbing Fragrance Free Sensitive Skin Formula  Keri Lotion Fast Absorbing Softly Scented Dry Skin Formula  Keri Original Lotion  Keri Skin Renewal Lotion Keri Silky Smooth Lotion  Keri Silky Smooth Sensitive Skin Lotion  Nivea Body Creamy Conditioning Oil  Nivea Body Extra Enriched Teacher, adult education Moisturizing Lotion Nivea Crme  Nivea Skin Firming Lotion  NutraDerm 30 Skin Lotion  NutraDerm Skin Lotion  NutraDerm Therapeutic Skin Cream  NutraDerm Therapeutic Skin Lotion  ProShield Protective Hand Cream  Provon moisturizing lotion     Please read over  the following fact sheets that you were given.  If you received a COVID test during your pre-op visit  it is requested that you wear a mask when out in public, stay away from anyone that may not be feeling well and notify your surgeon if you develop symptoms. If you have been in contact with anyone that has tested positive in the last 10 days please notify you surgeon./

## 2023-09-20 NOTE — Progress Notes (Addendum)
PCP - Alyssa [name] Cardiologist -  Dr. Wyline Mood (patient has a new patient appointment in Oct. She denied pre op clearance)  PPM/ICD - denies Device Orders - n/a Rep Notified - n/a  Chest x-ray - 03-08-22 EKG - 05-26-23 Stress Test -  ECHO - 05-26-23 Cardiac Cath - 03-08-22  Sleep Study - denies CPAP - n/a   DM- denies  Blood Thinner Instructions:denies Aspirin Instructions: per patient she is to stop medication seven days prior to procedure per surgeon  ERAS Protcol -Npo  COVID TEST- n/a   Anesthesia review: yes cardiac hx  Patient denies shortness of breath, fever, cough and chest pain at PAT appointment   All instructions explained to the patient, with a verbal understanding of the material. Patient agrees to go over the instructions while at home for a better understanding. Patient also instructed to self quarantine after being tested for COVID-19. The opportunity to ask questions was provided.

## 2023-09-20 NOTE — Telephone Encounter (Signed)
Note with addendum 07/17/23 was faxed to [facility]

## 2023-09-24 ENCOUNTER — Encounter (HOSPITAL_COMMUNITY): Payer: Self-pay

## 2023-09-24 ENCOUNTER — Telehealth: Payer: Self-pay

## 2023-09-24 ENCOUNTER — Telehealth: Payer: Self-pay | Admitting: Emergency Medicine
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
## 2023-09-24 NOTE — Telephone Encounter (Signed)
We have not seen this patient since her last hospitalization. I will defer ASA hold to her new cardiologist, Dr. Judie Petit. Wyline Mood.

## 2023-09-24 NOTE — Telephone Encounter (Signed)
Pre-operative Risk Assessment    Patient Name: Alyssa [name]  DOB: [birth year] MRN: [id number]   PATIENT IS SCHEDULED FOR A NEW PATIENT APPOINTMENT WITH DR. MARY BRANCH ON 11/15/23  Request for Surgical Clearance    Procedure:  Lumbar Fusion   Date of Surgery:  Clearance 10/01/23                                 Surgeon:  Dr. Julio Sicks Surgeon's Group or Practice Name:  [facility]  Phone number:  [phone] ext. [phone]  Fax number:  [phone]   Type of Clearance Requested:   - Pharmacy:  Hold Aspirin     Type of Anesthesia:  General    Additional requests/questions:    Scarlette Shorts   09/24/2023, 1:23 PM

## 2023-09-24 NOTE — Telephone Encounter (Signed)
PT needs more Spiriva called in and would like samples left as well. Her # is [phone]  Ilda Basset is [facility] in [place]

## 2023-09-24 NOTE — Telephone Encounter (Signed)
Patient call in to our schedulers for a sooner appointment. Patient is now scheduled to see Bernadene Person, NP on 09/25/23 for clearance .

## 2023-09-25 ENCOUNTER — Ambulatory Visit: Payer: Medicare Other | Attending: Internal Medicine | Admitting: Nurse Practitioner

## 2023-09-25 ENCOUNTER — Encounter: Payer: Self-pay | Admitting: Nurse Practitioner

## 2023-09-25 VITALS — BP 138/86 | HR 75 | Ht 65.0 in | Wt 166.6 lb

## 2023-09-25 DIAGNOSIS — Z01810 Encounter for preprocedural cardiovascular examination: Secondary | ICD-10-CM | POA: Diagnosis not present

## 2023-09-25 DIAGNOSIS — F1910 Other psychoactive substance abuse, uncomplicated: Secondary | ICD-10-CM | POA: Diagnosis not present

## 2023-09-25 DIAGNOSIS — Z87898 Personal history of other specified conditions: Secondary | ICD-10-CM | POA: Diagnosis not present

## 2023-09-25 DIAGNOSIS — M5136 Other intervertebral disc degeneration, lumbar region: Secondary | ICD-10-CM | POA: Insufficient documentation

## 2023-09-25 DIAGNOSIS — M51369 Other intervertebral disc degeneration, lumbar region without mention of lumbar back pain or lower extremity pain: Secondary | ICD-10-CM | POA: Insufficient documentation

## 2023-09-25 DIAGNOSIS — J302 Other seasonal allergic rhinitis: Secondary | ICD-10-CM | POA: Insufficient documentation

## 2023-09-25 DIAGNOSIS — I700 Atherosclerosis of aorta: Secondary | ICD-10-CM | POA: Insufficient documentation

## 2023-09-25 DIAGNOSIS — I2510 Atherosclerotic heart disease of native coronary artery without angina pectoris: Secondary | ICD-10-CM

## 2023-09-25 DIAGNOSIS — M47816 Spondylosis without myelopathy or radiculopathy, lumbar region: Secondary | ICD-10-CM | POA: Insufficient documentation

## 2023-09-25 DIAGNOSIS — M625A0 Muscle wasting and atrophy, not elsewhere classified, back, cervical: Secondary | ICD-10-CM | POA: Insufficient documentation

## 2023-09-25 MED ORDER — SPIRIVA RESPIMAT 2.5 MCG/ACT IN AERS: 2.0000 | INHALATION_SPRAY | Freq: Every day | RESPIRATORY_TRACT | 5 refills | Status: DC

## 2023-09-25 NOTE — Telephone Encounter (Signed)
Patient checking on message for samples of Spiriva. Patient out of medication. Patient phone number is [phone].

## 2023-09-25 NOTE — Anesthesia Preprocedure Evaluation (Addendum)
Anesthesia Evaluation  Patient identified by MRN, date of birth, ID band Patient awake    Reviewed: Allergy & Precautions, NPO status , Patient's Chart, lab work & pertinent test results, reviewed documented beta blocker date and time   History of Anesthesia Complications Negative for: history of anesthetic complications  Airway Mallampati: III  TM Distance: >3 FB Neck ROM: Full    Dental  (+) Edentulous Upper   Pulmonary neg shortness of breath, COPD, neg recent URI, Current Smoker and Patient abstained from smoking.   breath sounds clear to auscultation       Cardiovascular (-) angina + Past MI  (-) CAD  Rhythm:Regular Rate:Normal  Syncope r/t cocaine use. Seen by cardiology, no further workup indicated. TTE with normal EF, WMA. LHC nonobstructive dz   Neuro/Psych  Headaches, neg Seizures PSYCHIATRIC DISORDERS Anxiety      Neuromuscular disease    GI/Hepatic ,,,(+) neg Cirrhosis    substance abuse  cocaine use and methamphetamine use  Endo/Other    Renal/GU Renal disease     Musculoskeletal  (+) Arthritis ,    Abdominal   Peds  Hematology   Anesthesia Other Findings   Reproductive/Obstetrics                              Anesthesia Physical Anesthesia Plan  ASA: 3  Anesthesia Plan: General   Post-op Pain Management:    Induction: Intravenous  PONV Risk Score and Plan: 2 and Ondansetron and Dexamethasone  Airway Management Planned: Oral ETT  Additional Equipment:   Intra-op Plan:   Post-operative Plan: Extubation in OR  Informed Consent: I have reviewed the patients History and Physical, chart, labs and discussed the procedure including the risks, benefits and alternatives for the proposed anesthesia with the patient or authorized representative who has indicated his/her understanding and acceptance.     Dental advisory given  Plan Discussed with:   Anesthesia Plan  Comments: (PAT note written 09/25/2023 by Shonna Chock, PA-C.  )        Anesthesia Quick Evaluation
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
## 2023-09-25 NOTE — Telephone Encounter (Signed)
From LOV 07/17/23: Start taking your Spiriva once daily every day as a maintenance medication.   Spoke with patient and she states she has not called for refill because "she has been using the Spiriva sparingly to try and make it last until her next visit". Patient reminded she was instructed at LOV to use the Spiriva 2 puffs once daily, patient states she has not been doing this. Advised patient we will send rx to pharmacy but cannot provide additional samples at this time. She will need to keep ROV as requested.   Rx sent to pharmacy.

## 2023-09-25 NOTE — Patient Instructions (Signed)
Medication Instructions:  No changes *If you need a refill on your cardiac medications before your next appointment, please call your pharmacy*   Lab Work: No Labs   Testing/Procedures: No Testing   Follow-Up: At [facility], you and your health needs are our priority.  As part of our continuing mission to provide you with exceptional heart care, we have created designated Provider Care Teams.  These Care Teams include your primary Cardiologist (physician) and Advanced Practice Providers (APPs -  Physician Assistants and Nurse Practitioners) who all work together to provide you with the care you need, when you need it.  We recommend signing up for the patient portal called "MyChart".  Sign up information is provided on this After Visit Summary.  MyChart is used to connect with patients for Virtual Visits (Telemedicine).  Patients are able to view lab/test results, encounter notes, upcoming appointments, etc.  Non-urgent messages can be sent to your provider as well.   To learn more about what you can do with MyChart, go to [email or web address].    Your next appointment:   6 month(s)  Provider:   Maisie Fus, MD

## 2023-09-26 ENCOUNTER — Other Ambulatory Visit: Payer: Self-pay | Admitting: Neurosurgery

## 2023-10-01 ENCOUNTER — Other Ambulatory Visit: Payer: Self-pay

## 2023-10-01 ENCOUNTER — Encounter (HOSPITAL_COMMUNITY): Payer: Self-pay | Admitting: Neurosurgery

## 2023-10-01 ENCOUNTER — Inpatient Hospital Stay (HOSPITAL_COMMUNITY): Payer: Medicare Other

## 2023-10-01 ENCOUNTER — Inpatient Hospital Stay (HOSPITAL_COMMUNITY): Payer: Medicare Other | Admitting: Physician Assistant

## 2023-10-01 ENCOUNTER — Inpatient Hospital Stay (HOSPITAL_COMMUNITY)
Admission: RE | Admit: 2023-10-01 | Discharge: 2023-10-02 | DRG: 451 | Disposition: A | Discharge status: Home / Self Care | Payer: Medicare Other | Attending: Neurosurgery | Admitting: Neurosurgery

## 2023-10-01 ENCOUNTER — Inpatient Hospital Stay (HOSPITAL_COMMUNITY): Payer: Medicare Other | Admitting: Anesthesiology

## 2023-10-01 ENCOUNTER — Inpatient Hospital Stay (HOSPITAL_COMMUNITY)
Admission: RE | Disposition: A | Discharge status: Home / Self Care | Payer: Self-pay | Source: Home / Self Care | Attending: Neurosurgery

## 2023-10-01 DIAGNOSIS — Z885 Allergy status to narcotic agent status: Secondary | ICD-10-CM

## 2023-10-01 DIAGNOSIS — Z91030 Bee allergy status: Secondary | ICD-10-CM | POA: Diagnosis not present

## 2023-10-01 DIAGNOSIS — M5416 Radiculopathy, lumbar region: Secondary | ICD-10-CM | POA: Diagnosis present

## 2023-10-01 DIAGNOSIS — J449 Chronic obstructive pulmonary disease, unspecified: Secondary | ICD-10-CM | POA: Diagnosis present

## 2023-10-01 DIAGNOSIS — M4316 Spondylolisthesis, lumbar region: Secondary | ICD-10-CM

## 2023-10-01 DIAGNOSIS — I252 Old myocardial infarction: Secondary | ICD-10-CM

## 2023-10-01 DIAGNOSIS — M48061 Spinal stenosis, lumbar region without neurogenic claudication: Secondary | ICD-10-CM | POA: Diagnosis present

## 2023-10-01 DIAGNOSIS — Z803 Family history of malignant neoplasm of breast: Secondary | ICD-10-CM | POA: Diagnosis not present

## 2023-10-01 DIAGNOSIS — Z90710 Acquired absence of both cervix and uterus: Secondary | ICD-10-CM | POA: Diagnosis not present

## 2023-10-01 DIAGNOSIS — I2510 Atherosclerotic heart disease of native coronary artery without angina pectoris: Secondary | ICD-10-CM | POA: Diagnosis present

## 2023-10-01 DIAGNOSIS — F17210 Nicotine dependence, cigarettes, uncomplicated: Secondary | ICD-10-CM | POA: Diagnosis present

## 2023-10-01 DIAGNOSIS — Z8249 Family history of ischemic heart disease and other diseases of the circulatory system: Secondary | ICD-10-CM | POA: Diagnosis not present

## 2023-10-01 DIAGNOSIS — Z888 Allergy status to other drugs, medicaments and biological substances status: Secondary | ICD-10-CM

## 2023-10-01 DIAGNOSIS — M51369 Other intervertebral disc degeneration, lumbar region without mention of lumbar back pain or lower extremity pain: Secondary | ICD-10-CM | POA: Diagnosis present

## 2023-10-01 DIAGNOSIS — Z823 Family history of stroke: Secondary | ICD-10-CM

## 2023-10-01 DIAGNOSIS — F419 Anxiety disorder, unspecified: Secondary | ICD-10-CM

## 2023-10-01 HISTORY — PX: ANTERIOR LATERAL LUMBAR FUSION WITH PERCUTANEOUS SCREW 1 LEVEL: SHX5553

## 2023-10-01 LAB — ABO/RH: ABO/RH(D): A POS

## 2023-10-01 SURGERY — ANTERIOR LATERAL LUMBAR FUSION WITH PERCUTANEOUS SCREW 1 LEVEL
Anesthesia: General | Laterality: Left

## 2023-10-01 MED ORDER — 0.9 % SODIUM CHLORIDE (POUR BTL) OPTIME
TOPICAL | Status: DC | PRN
  Administered 2023-10-01: 1000 mL

## 2023-10-01 MED ORDER — PHENOL 1.4 % MT LIQD: 1.0000 | OROMUCOSAL | Status: DC | PRN

## 2023-10-01 MED ORDER — OXYCODONE HCL 5 MG PO TABS
10.0000 mg | ORAL_TABLET | ORAL | Status: DC | PRN
  Administered 2023-10-01 – 2023-10-02 (×4): 10 mg via ORAL
  Filled 2023-10-01 (×4): qty 2

## 2023-10-01 MED ORDER — ALBUTEROL SULFATE HFA 108 (90 BASE) MCG/ACT IN AERS: 2.0000 | INHALATION_SPRAY | Freq: Four times a day (QID) | RESPIRATORY_TRACT | Status: DC | PRN

## 2023-10-01 MED ORDER — ALPRAZOLAM 0.5 MG PO TABS
1.0000 mg | ORAL_TABLET | Freq: Three times a day (TID) | ORAL | Status: DC
  Administered 2023-10-01 – 2023-10-02 (×3): 1 mg via ORAL
  Filled 2023-10-01 (×3): qty 2

## 2023-10-01 MED ORDER — MENTHOL 3 MG MT LOZG: 1.0000 | LOZENGE | OROMUCOSAL | Status: DC | PRN

## 2023-10-01 MED ORDER — MIDAZOLAM HCL 2 MG/2ML IJ SOLN
INTRAMUSCULAR | Status: DC | PRN
  Administered 2023-10-01: 2 mg via INTRAVENOUS

## 2023-10-01 MED ORDER — BUPIVACAINE HCL (PF) 0.25 % IJ SOLN
INTRAMUSCULAR | Status: AC
  Filled 2023-10-01: qty 30

## 2023-10-01 MED ORDER — CHLORHEXIDINE GLUCONATE CLOTH 2 % EX PADS: 6.0000 | MEDICATED_PAD | Freq: Once | CUTANEOUS | Status: DC

## 2023-10-01 MED ORDER — SODIUM CHLORIDE 0.9% FLUSH: 3.0000 mL | INTRAVENOUS | Status: DC | PRN

## 2023-10-01 MED ORDER — SODIUM CHLORIDE 0.9 % IV SOLN
250.0000 mL | INTRAVENOUS | Status: DC
  Administered 2023-10-01: 250 mL via INTRAVENOUS

## 2023-10-01 MED ORDER — ACETAMINOPHEN 10 MG/ML IV SOLN
1000.0000 mg | Freq: Once | INTRAVENOUS | Status: DC | PRN
  Administered 2023-10-01: 1000 mg via INTRAVENOUS

## 2023-10-01 MED ORDER — METHOCARBAMOL 750 MG PO TABS
750.0000 mg | ORAL_TABLET | Freq: Three times a day (TID) | ORAL | Status: DC
  Administered 2023-10-01 – 2023-10-02 (×3): 750 mg via ORAL
  Filled 2023-10-01 (×3): qty 1

## 2023-10-01 MED ORDER — BISACODYL 10 MG RE SUPP: 10.0000 mg | Freq: Every day | RECTAL | Status: DC | PRN

## 2023-10-01 MED ORDER — FENTANYL CITRATE (PF) 250 MCG/5ML IJ SOLN
INTRAMUSCULAR | Status: AC
  Filled 2023-10-01: qty 5

## 2023-10-01 MED ORDER — LACTATED RINGERS IV SOLN: INTRAVENOUS | Status: DC

## 2023-10-01 MED ORDER — THROMBIN 5000 UNITS EX SOLR
CUTANEOUS | Status: AC
  Filled 2023-10-01: qty 5000

## 2023-10-01 MED ORDER — PROPOFOL 10 MG/ML IV BOLUS
INTRAVENOUS | Status: AC
  Filled 2023-10-01: qty 20

## 2023-10-01 MED ORDER — BUPIVACAINE HCL (PF) 0.25 % IJ SOLN
INTRAMUSCULAR | Status: DC | PRN
  Administered 2023-10-01: 20 mL

## 2023-10-01 MED ORDER — OXYCODONE HCL 5 MG PO TABS
ORAL_TABLET | ORAL | Status: AC
  Filled 2023-10-01: qty 1

## 2023-10-01 MED ORDER — DIAZEPAM 5 MG PO TABS: 5.0000 mg | ORAL_TABLET | Freq: Four times a day (QID) | ORAL | Status: DC | PRN

## 2023-10-01 MED ORDER — OXYCODONE HCL 5 MG/5ML PO SOLN: 5.0000 mg | Freq: Once | ORAL | Status: AC | PRN

## 2023-10-01 MED ORDER — FLEET ENEMA RE ENEM: 1.0000 | ENEMA | Freq: Once | RECTAL | Status: DC | PRN

## 2023-10-01 MED ORDER — POLYETHYLENE GLYCOL 3350 17 G PO PACK
17.0000 g | PACK | Freq: Every day | ORAL | Status: DC
  Administered 2023-10-02: 17 g via ORAL
  Filled 2023-10-01 (×2): qty 1

## 2023-10-01 MED ORDER — HYDROMORPHONE HCL 1 MG/ML IJ SOLN
1.0000 mg | INTRAMUSCULAR | Status: DC | PRN
  Administered 2023-10-01 (×2): 1 mg via INTRAVENOUS
  Filled 2023-10-01 (×3): qty 1

## 2023-10-01 MED ORDER — FENTANYL CITRATE (PF) 100 MCG/2ML IJ SOLN
INTRAMUSCULAR | Status: AC
  Filled 2023-10-01: qty 2

## 2023-10-01 MED ORDER — GLYCOPYRROLATE 0.2 MG/ML IJ SOLN
INTRAMUSCULAR | Status: DC | PRN
Start: 2023-10-01 — End: 2023-10-01
  Administered 2023-10-01: .2 mg via INTRAVENOUS

## 2023-10-01 MED ORDER — ASPIRIN 81 MG PO TBEC
81.0000 mg | DELAYED_RELEASE_TABLET | Freq: Every day | ORAL | Status: DC
  Administered 2023-10-01: 81 mg via ORAL
  Filled 2023-10-01: qty 1

## 2023-10-01 MED ORDER — TRAMADOL HCL 50 MG PO TABS
100.0000 mg | ORAL_TABLET | Freq: Three times a day (TID) | ORAL | Status: DC
  Administered 2023-10-01 – 2023-10-02 (×3): 100 mg via ORAL
  Filled 2023-10-01 (×3): qty 2

## 2023-10-01 MED ORDER — PREGABALIN 100 MG PO CAPS
100.0000 mg | ORAL_CAPSULE | Freq: Three times a day (TID) | ORAL | Status: DC
  Administered 2023-10-01 – 2023-10-02 (×3): 100 mg via ORAL
  Filled 2023-10-01 (×3): qty 1

## 2023-10-01 MED ORDER — PROPOFOL 10 MG/ML IV BOLUS
INTRAVENOUS | Status: DC | PRN
  Administered 2023-10-01: 110 mg via INTRAVENOUS

## 2023-10-01 MED ORDER — CEFAZOLIN SODIUM-DEXTROSE 1-4 GM/50ML-% IV SOLN
1.0000 g | Freq: Three times a day (TID) | INTRAVENOUS | Status: AC
  Administered 2023-10-01 (×2): 1 g via INTRAVENOUS
  Filled 2023-10-01 (×2): qty 50

## 2023-10-01 MED ORDER — ONDANSETRON HCL 4 MG/2ML IJ SOLN
4.0000 mg | Freq: Four times a day (QID) | INTRAMUSCULAR | Status: DC | PRN
  Administered 2023-10-01: 4 mg via INTRAVENOUS
  Filled 2023-10-01: qty 2

## 2023-10-01 MED ORDER — HYDROMORPHONE HCL 1 MG/ML IJ SOLN
INTRAMUSCULAR | Status: AC
  Filled 2023-10-01: qty 1

## 2023-10-01 MED ORDER — ONDANSETRON HCL 4 MG/2ML IJ SOLN
INTRAMUSCULAR | Status: AC
  Filled 2023-10-01: qty 2

## 2023-10-01 MED ORDER — CHLORHEXIDINE GLUCONATE 0.12 % MT SOLN
15.0000 mL | Freq: Once | OROMUCOSAL | Status: AC
  Administered 2023-10-01: 15 mL via OROMUCOSAL
  Filled 2023-10-01: qty 15

## 2023-10-01 MED ORDER — THROMBIN (RECOMBINANT) 5000 UNITS EX SOLR
CUTANEOUS | Status: DC | PRN
  Administered 2023-10-01: 10 mL via TOPICAL

## 2023-10-01 MED ORDER — ONDANSETRON HCL 4 MG/2ML IJ SOLN
INTRAMUSCULAR | Status: DC | PRN
  Administered 2023-10-01: 4 mg via INTRAVENOUS

## 2023-10-01 MED ORDER — HYDROCODONE-ACETAMINOPHEN 10-325 MG PO TABS: 1.0000 | ORAL_TABLET | ORAL | Status: DC | PRN

## 2023-10-01 MED ORDER — TIOTROPIUM BROMIDE MONOHYDRATE 2.5 MCG/ACT IN AERS: 2.0000 | INHALATION_SPRAY | Freq: Every day | RESPIRATORY_TRACT | Status: DC

## 2023-10-01 MED ORDER — ONDANSETRON HCL 4 MG/2ML IJ SOLN: 4.0000 mg | Freq: Once | INTRAMUSCULAR | Status: DC | PRN

## 2023-10-01 MED ORDER — HYDROXYZINE HCL 50 MG/ML IM SOLN
50.0000 mg | Freq: Four times a day (QID) | INTRAMUSCULAR | Status: DC | PRN
  Administered 2023-10-01: 50 mg via INTRAMUSCULAR
  Filled 2023-10-01: qty 1

## 2023-10-01 MED ORDER — ACETAMINOPHEN 325 MG PO TABS
650.0000 mg | ORAL_TABLET | ORAL | Status: DC | PRN
  Administered 2023-10-01 – 2023-10-02 (×3): 650 mg via ORAL
  Filled 2023-10-01 (×3): qty 2

## 2023-10-01 MED ORDER — POLYETHYLENE GLYCOL 3350 17 G PO PACK
17.0000 g | PACK | Freq: Every day | ORAL | Status: DC | PRN
  Administered 2023-10-01: 17 g via ORAL
  Filled 2023-10-01: qty 1

## 2023-10-01 MED ORDER — ALBUTEROL SULFATE (2.5 MG/3ML) 0.083% IN NEBU: 2.5000 mg | INHALATION_SOLUTION | Freq: Four times a day (QID) | RESPIRATORY_TRACT | Status: DC | PRN

## 2023-10-01 MED ORDER — KETAMINE HCL 50 MG/5ML IJ SOSY
PREFILLED_SYRINGE | INTRAMUSCULAR | Status: AC
  Filled 2023-10-01: qty 5

## 2023-10-01 MED ORDER — ALUM & MAG HYDROXIDE-SIMETH 200-200-20 MG/5ML PO SUSP
30.0000 mL | Freq: Four times a day (QID) | ORAL | Status: DC | PRN
  Administered 2023-10-01: 30 mL via ORAL
  Filled 2023-10-01: qty 30

## 2023-10-01 MED ORDER — DEXAMETHASONE SODIUM PHOSPHATE 10 MG/ML IJ SOLN
INTRAMUSCULAR | Status: DC | PRN
  Administered 2023-10-01: 10 mg via INTRAVENOUS

## 2023-10-01 MED ORDER — LIDOCAINE 2% (20 MG/ML) 5 ML SYRINGE
INTRAMUSCULAR | Status: AC
  Filled 2023-10-01: qty 5

## 2023-10-01 MED ORDER — ORAL CARE MOUTH RINSE: 15.0000 mL | Freq: Once | OROMUCOSAL | Status: AC

## 2023-10-01 MED ORDER — ACETAMINOPHEN 10 MG/ML IV SOLN
INTRAVENOUS | Status: AC
  Filled 2023-10-01: qty 100

## 2023-10-01 MED ORDER — FENTANYL CITRATE (PF) 100 MCG/2ML IJ SOLN
25.0000 ug | INTRAMUSCULAR | Status: DC | PRN
  Administered 2023-10-01 (×3): 50 ug via INTRAVENOUS

## 2023-10-01 MED ORDER — SUCCINYLCHOLINE CHLORIDE 200 MG/10ML IV SOSY
PREFILLED_SYRINGE | INTRAVENOUS | Status: DC | PRN
  Administered 2023-10-01: 200 mg via INTRAVENOUS

## 2023-10-01 MED ORDER — POLYETHYLENE GLYCOL 3350 17 GM/SCOOP PO POWD
17.0000 g | Freq: Every day | ORAL | Status: DC
  Filled 2023-10-01: qty 255

## 2023-10-01 MED ORDER — VITAMIN D 25 MCG (1000 UNIT) PO TABS: 5000.0000 [IU] | ORAL_TABLET | Freq: Every evening | ORAL | Status: DC

## 2023-10-01 MED ORDER — LORATADINE 10 MG PO TABS
10.0000 mg | ORAL_TABLET | Freq: Every day | ORAL | Status: DC
  Administered 2023-10-01 – 2023-10-02 (×2): 10 mg via ORAL
  Filled 2023-10-01 (×2): qty 1

## 2023-10-01 MED ORDER — MIDAZOLAM HCL 2 MG/2ML IJ SOLN
INTRAMUSCULAR | Status: AC
  Filled 2023-10-01: qty 2

## 2023-10-01 MED ORDER — LACTATED RINGERS IV SOLN
INTRAVENOUS | Status: DC | PRN
Start: 2023-10-01 — End: 2023-10-01

## 2023-10-01 MED ORDER — SUCCINYLCHOLINE CHLORIDE 200 MG/10ML IV SOSY
PREFILLED_SYRINGE | INTRAVENOUS | Status: AC
  Filled 2023-10-01: qty 10

## 2023-10-01 MED ORDER — OXYCODONE HCL 5 MG PO TABS
5.0000 mg | ORAL_TABLET | Freq: Once | ORAL | Status: AC | PRN
  Administered 2023-10-01: 5 mg via ORAL

## 2023-10-01 MED ORDER — CEFAZOLIN SODIUM-DEXTROSE 2-4 GM/100ML-% IV SOLN
2.0000 g | INTRAVENOUS | Status: AC
  Administered 2023-10-01: 2 g via INTRAVENOUS
  Filled 2023-10-01: qty 100

## 2023-10-01 MED ORDER — LIDOCAINE 2% (20 MG/ML) 5 ML SYRINGE
INTRAMUSCULAR | Status: DC | PRN
  Administered 2023-10-01: 100 mg via INTRAVENOUS

## 2023-10-01 MED ORDER — FENTANYL CITRATE (PF) 250 MCG/5ML IJ SOLN
INTRAMUSCULAR | Status: DC | PRN
  Administered 2023-10-01: 100 ug via INTRAVENOUS
  Administered 2023-10-01 (×3): 50 ug via INTRAVENOUS

## 2023-10-01 MED ORDER — SODIUM CHLORIDE 0.9% FLUSH
3.0000 mL | Freq: Two times a day (BID) | INTRAVENOUS | Status: DC
  Administered 2023-10-01 (×2): 3 mL via INTRAVENOUS

## 2023-10-01 MED ORDER — ACETAMINOPHEN 650 MG RE SUPP: 650.0000 mg | RECTAL | Status: DC | PRN

## 2023-10-01 MED ORDER — ONDANSETRON HCL 4 MG PO TABS: 4.0000 mg | ORAL_TABLET | Freq: Four times a day (QID) | ORAL | Status: DC | PRN

## 2023-10-01 MED ORDER — DEXAMETHASONE SODIUM PHOSPHATE 10 MG/ML IJ SOLN
INTRAMUSCULAR | Status: AC
  Filled 2023-10-01: qty 1

## 2023-10-01 MED ORDER — UMECLIDINIUM BROMIDE 62.5 MCG/ACT IN AEPB
1.0000 | INHALATION_SPRAY | Freq: Every day | RESPIRATORY_TRACT | Status: DC
  Administered 2023-10-02: 1 via RESPIRATORY_TRACT
  Filled 2023-10-01: qty 7

## 2023-10-01 SURGICAL SUPPLY — 53 items
ADH SKN CLS APL DERMABOND .7 (GAUZE/BANDAGES/DRESSINGS) ×1
APL SKNCLS STERI-STRIP NONHPOA (GAUZE/BANDAGES/DRESSINGS) ×1
BAG COUNTER SPONGE SURGICOUNT (BAG) ×1 IMPLANT
BAG DECANTER FOR FLEXI CONT (MISCELLANEOUS) ×1 IMPLANT
BAG SPNG CNTER NS LX DISP (BAG) ×1
BENZOIN TINCTURE PRP APPL 2/3 (GAUZE/BANDAGES/DRESSINGS) ×1 IMPLANT
BLADE CLIPPER SURG (BLADE) IMPLANT
BOLT SPNL LRG 45X5.5XPLAT NS (Screw) IMPLANT
BONE MATRIX OSTEOCEL PRO MED (Bone Implant) IMPLANT
BUR MATCHSTICK NEURO 3.0 LAGG (BURR) IMPLANT
COVER BACK TABLE 60X90IN (DRAPES) ×1 IMPLANT
DERMABOND ADVANCED .7 DNX12 (GAUZE/BANDAGES/DRESSINGS) ×2 IMPLANT
DRAPE C-ARM 42X72 X-RAY (DRAPES) ×1 IMPLANT
DRAPE C-ARMOR (DRAPES) ×1 IMPLANT
DRAPE LAPAROTOMY 100X72X124 (DRAPES) ×1 IMPLANT
DRAPE SURG 17X23 STRL (DRAPES) ×2 IMPLANT
DRSG OPSITE POSTOP 4X8 (GAUZE/BANDAGES/DRESSINGS) IMPLANT
DURAPREP 26ML APPLICATOR (WOUND CARE) ×1 IMPLANT
ELECT BLADE 6.5 EXT (BLADE) IMPLANT
ELECT REM PT RETURN 9FT ADLT (ELECTROSURGICAL) ×1
ELECTRODE REM PT RTRN 9FT ADLT (ELECTROSURGICAL) ×1 IMPLANT
FEE INTRAOP MONITOR IMPULS NCS (MISCELLANEOUS) IMPLANT
GAUZE 4X4 16PLY ~~LOC~~+RFID DBL (SPONGE) IMPLANT
GLOVE BIO SURGEON STRL SZ 6.5 (GLOVE) ×1 IMPLANT
GLOVE BIOGEL PI IND STRL 6.5 (GLOVE) ×1 IMPLANT
GLOVE ECLIPSE 9.0 STRL (GLOVE) ×1 IMPLANT
GLOVE EXAM NITRILE XL STR (GLOVE) IMPLANT
GOWN STRL REUS W/ TWL LRG LVL3 (GOWN DISPOSABLE) IMPLANT
GOWN STRL REUS W/ TWL XL LVL3 (GOWN DISPOSABLE) ×2 IMPLANT
GOWN STRL REUS W/TWL 2XL LVL3 (GOWN DISPOSABLE) IMPLANT
GOWN STRL REUS W/TWL LRG LVL3 (GOWN DISPOSABLE) ×1
GOWN STRL REUS W/TWL XL LVL3 (GOWN DISPOSABLE) ×2
INTRAOP MONITOR FEE IMPULS NCS (MISCELLANEOUS)
KIT BASIN OR (CUSTOM PROCEDURE TRAY) ×1 IMPLANT
KIT DILATOR XLIF 5 (KITS) IMPLANT
KIT SURGICAL ACCESS MAXCESS 4 (KITS) IMPLANT
KIT TURNOVER KIT B (KITS) ×1 IMPLANT
MODULE NVM5 NEXT GEN EMG (NEUROSURGERY SUPPLIES) IMPLANT
MODULUS XLW 10X22X50MM 10DEG (Spine Construct) IMPLANT
NDL HYPO 22X1.5 SAFETY MO (MISCELLANEOUS) ×1 IMPLANT
NEEDLE HYPO 22X1.5 SAFETY MO (MISCELLANEOUS) ×1 IMPLANT
NS IRRIG 1000ML POUR BTL (IV SOLUTION) ×1 IMPLANT
PACK LAMINECTOMY NEURO (CUSTOM PROCEDURE TRAY) ×1 IMPLANT
PLATE 2H 10MM (Plate) IMPLANT
SCREW 45MM (Screw) ×2 IMPLANT
SPONGE T-LAP 4X18 ~~LOC~~+RFID (SPONGE) IMPLANT
STRIP CLOSURE SKIN 1/2X4 (GAUZE/BANDAGES/DRESSINGS) ×1 IMPLANT
SUT VIC AB 2-0 CT1 18 (SUTURE) ×2 IMPLANT
SUT VIC AB 3-0 SH 8-18 (SUTURE) ×2 IMPLANT
TOWEL GREEN STERILE (TOWEL DISPOSABLE) ×1 IMPLANT
TOWEL GREEN STERILE FF (TOWEL DISPOSABLE) ×1 IMPLANT
TRAY FOLEY MTR SLVR 16FR STAT (SET/KITS/TRAYS/PACK) ×1 IMPLANT
WATER STERILE IRR 1000ML POUR (IV SOLUTION) ×1 IMPLANT

## 2023-10-01 NOTE — Transfer of Care (Signed)
Immediate Anesthesia Transfer of Care Note  Patient: Alyssa [name]  Procedure(s) Performed: Extreme Lateral Interbody Fusion - left - Left two-Lumbar three - Interbody Fusion with lateral plating (Left)  Patient Location: PACU  Anesthesia Type:General  Level of Consciousness: awake, alert , and oriented  Airway & Oxygen Therapy: Patient connected to face mask oxygen  Post-op Assessment: Post -op Vital signs reviewed and stable  Post vital signs: stable  Last Vitals:  Vitals Value Taken Time  BP 179/93 10/01/23 1016  Temp 36.4 C 10/01/23 1015  Pulse 69 10/01/23 1029  Resp 13 10/01/23 1029  SpO2 93 % 10/01/23 1029  Vitals shown include unfiled device data.  Last Pain:  Vitals:   10/01/23 1015  TempSrc:   PainSc: Asleep      Patients Stated Pain Goal: 5 (10/01/23 1610)  Complications: There were no known notable events for this encounter.

## 2023-10-01 NOTE — Anesthesia Procedure Notes (Signed)
Procedure Name: Intubation Date/Time: 10/01/2023 8:17 AM  Performed by: Randon Goldsmith, CRNAPre-anesthesia Checklist: Patient identified, Emergency Drugs available, Suction available, Patient being monitored and Timeout performed Patient Re-evaluated:Patient Re-evaluated prior to induction Oxygen Delivery Method: Circle system utilized Preoxygenation: Pre-oxygenation with 100% oxygen Induction Type: IV induction Ventilation: Mask ventilation without difficulty Laryngoscope Size: Mac and 4 Grade View: Grade I Tube type: Oral Tube size: 7.0 mm Number of attempts: 1 Airway Equipment and Method: Stylet Placement Confirmation: ETT inserted through vocal cords under direct vision, positive ETCO2, CO2 detector and breath sounds checked- equal and bilateral Secured at: 21 cm Tube secured with: Tape Dental Injury: Teeth and Oropharynx as per pre-operative assessment

## 2023-10-01 NOTE — Op Note (Signed)
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  Postoperative diagnosis: Same  Procedure Name: Left L2-L3 anterior lateral retroperitoneal interbody decompression and fusion utilizing interbody cage and morselized allograft  L2-3 lateral plate fixation  Surgeon:Vaeda Westall A.Haydon Dorris, M.D.  Asst. Surgeon: Doran Durand, NP  Anesthesia: General  Indication: 67 year old female remotely status post L3-S1 decompression and fusion with reasonably good results.  Patient with progressively worsening back pain and radicular symptoms failing all efforts of conservative manage.  Workup demonstrates evidence of progressive disc generation with disc base collapse and degenerative retrolisthesis with associated foraminal stenosis at L2-3.  No evidence of significant central or lateral recess stenosis.  Patient has failed conservative management presents now for L2-3 decompression and fusion in hopes improving her symptoms.  Operative note: After induction of anesthesia, patient positioned in the right lateral decubitus position and appropriately padded.  Patient's left flank was prepped and draped sterilely.  Incision made overlying L2-3 and laterally and a secondary incision was made in the left posterior flank.  Starting first of the left posterior flank incision blunt dissection was then performed into the retroperitoneal space.  The peritoneal sac and contents were mobilized and pushed anteriorly.  Simultaneously a distractor was then passed through the lateral abdominal wall into the lateral aspect of the L2-3 disc space.  This was confirmed to be in good position by fluoroscopy.  Neuromonitoring was performed at this time and throughout.  Direct stimulation revealed no evidence of adjacent neural structures.  A guidewire was  then impacted into the L2-3 disc space.  The dilators were progressively enlarged again directly stimulating and finding no evidence of adjacent neural structures.  The self-retaining retractor was then placed.  This was stimulated and again found to be safe.  The distractor was opened slightly.  A shim was then passed under fluoroscopic visualization into the L2-3 disc space.  The guidewire was removed.  The disc space was directly stimulated and again found to be free of any traversing nerves.  The disc space was then incised after the distractor was widened.  Discectomy was then performed using various instruments.  Contralateral release was then performed using Cobb elevators.  The space was progressively distracted and a 10 mm implant was found to be most appropriate.  A 10 mm x 22 mm x 50 mm titanium printed cage was then packed with Osteocel plus.  This was then impacted into place and confirmed to be in good position in both the AP and lateral plane.  A lateral NuVasive plate was then placed over the L2 and L3 levels.  The plate was then attached to the L2 and L3 vertebral bodies utilizing bicortical screw fixation with 6.5 millimeter screw anchors.  Bicortical purchase was ensured using fluoroscopy.  The screws and the plate were well-positioned on both AP and lateral visualization.  The locking mechanism was engaged in both screw heads.  The bed was returned to a neutral position and the plate was locked in place.  Final images reveal good position of the cage and the hardware at the proper level with normal alignment of spine.  Both wounds were irrigated then closed in layers with Vicryl sutures.  Steri-Strips and sterile dressing were applied.  No apparent complications.  Patient tolerated the procedure well and she returns to recovery room postop.

## 2023-10-01 NOTE — Brief Op Note (Signed)
10/01/2023  10:01 AM  PATIENT:  Alyssa [name]  67 y.o. female  PRE-OPERATIVE DIAGNOSIS:  Spondylolisthesis lumbar  POST-OPERATIVE DIAGNOSIS:  Spondylolisthesis lumbar  PROCEDURE:  Procedure(s): Extreme Lateral Interbody Fusion - left - Left two-Lumbar three - Interbody Fusion with lateral plating (Left)  SURGEON:  Surgeons and Role:    Julio Sicks, MD - Primary  PHYSICIAN ASSISTANT:   ASSISTANTSMarland Mcalpine   ANESTHESIA:   general  EBL:  30 mL   BLOOD ADMINISTERED:none  DRAINS: none   LOCAL MEDICATIONS USED:  MARCAINE     SPECIMEN:  No Specimen  DISPOSITION OF SPECIMEN:  N/A  COUNTS:  YES  TOURNIQUET:  * No tourniquets in log *  DICTATION: .Dragon Dictation  PLAN OF CARE: Admit to inpatient   PATIENT DISPOSITION:  PACU - hemodynamically stable.   Delay start of Pharmacological VTE agent (>24hrs) due to surgical blood loss or risk of bleeding: yes

## 2023-10-01 NOTE — Anesthesia Postprocedure Evaluation (Signed)
Anesthesia Post Note  Patient: ZYA FINKLE  Procedure(s) Performed: Extreme Lateral Interbody Fusion - left - Left two-Lumbar three - Interbody Fusion with lateral plating (Left)     Patient location during evaluation: PACU Anesthesia Type: General Level of consciousness: awake and alert Pain management: pain level controlled Vital Signs Assessment: post-procedure vital signs reviewed and stable Respiratory status: spontaneous breathing, nonlabored ventilation, respiratory function stable and patient connected to nasal cannula oxygen Cardiovascular status: blood pressure returned to baseline and stable Postop Assessment: no apparent nausea or vomiting Anesthetic complications: no   There were no known notable events for this encounter.  Last Vitals:  Vitals:   10/01/23 1100 10/01/23 1115  BP: (!) 161/98 (!) 186/78  Pulse: 63 64  Resp: 11 14  Temp:  (!) 36.4 C  SpO2: 97% 99%    Last Pain:  Vitals:   10/01/23 1151  TempSrc:   PainSc: 8                  Mariann Barter

## 2023-10-01 NOTE — H&P (Signed)
Alyssa [name] is an 67 y.o. female.   Chief Complaint: Back pain HPI: 67 year old female status post prior L3-S1 decompression and fusion with reasonably good results presents with worsening back pain failing conservative management workup demonstrates evidence of progressive adjacent level degeneration at L2-3 with degenerative retrolisthesis and foraminal stenosis.  Patient presents now for L2-3 anterior lateral interbody decompression and fusion in hopes improving her symptoms.  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   Nerve damage    to back and legs   Primary localized osteoarthrosis, lower leg    oa   Pulmonary nodules    Tobacco abuse    Wears dentures top   Wears glasses     Past Surgical History:  Procedure Laterality Date   ABDOMINAL HYSTERECTOMY  yrs ago   1 ovary left   ANTERIOR AND POSTERIOR REPAIR N/A 05/30/2021   Procedure: ANTERIOR (CYSTOCELE) AND POSTERIOR REPAIR (RECTOCELE);  Surgeon: Lavina Hamman, MD;  Location: [facility];  Service: Gynecology;  Laterality: N/A;   ANTERIOR CERVICAL DECOMP/DISCECTOMY FUSION N/A 09/10/2022   Procedure: Anterior Cervical Decompression Fusion - Cervical five-Cervical six;  Surgeon: Julio Sicks, MD;  Location[facility];  Service: Neurosurgery;  Laterality: N/A;   BACK SURGERY  yrs ago   x3 bolts  and screws and rods x 2 lower back   BREAST BIOPSY Left    COLPOCLEISIS N/A 03/22/2022   Procedure: EXAM UNDER ANESTHESIA, COLPOCLEISIS; PERINEORRHAPHY;  Surgeon: Marguerita Beards, MD;  Location: [facility];  Service: Gynecology;  Laterality: N/A;   CYSTOSCOPY N/A 03/22/2022   Procedure: CYSTOSCOPY;  Surgeon: Marguerita Beards, MD;  Location: [facility];  Service: Gynecology;  Laterality: N/A;   HEMORRHOID SURGERY  yrs ago   LEFT HEART CATH AND CORONARY ANGIOGRAPHY N/A 03/08/2022   Procedure: LEFT HEART CATH AND CORONARY ANGIOGRAPHY;  Surgeon: Kathleene Hazel, MD;  Location: [facility];  Service: Cardiovascular;  Laterality: N/A;   ROBOTIC ASSISTED LAPAROSCOPIC SACROCOLPOPEXY N/A 02/13/2022   Procedure: XI ROBOTIC ASSISTED LAPAROSCOPIC LYSIS OF ADHESIONS;  Surgeon: Marguerita Beards, MD;  Location: [facility];  Service: Gynecology;  Laterality: N/A;   TONSILLECTOMY     VAGINAL PROLAPSE REPAIR N/A 10/19/2021   Procedure: VAGINAL VAULT SUSPENSION;  Surgeon: Lavina Hamman, MD;  Location[facility];  Service: Gynecology;  Laterality: N/A;   VULVAR LESION REMOVAL N/A 05/30/2021   Procedure: REMOVAL OF VULVAR CYST;  Surgeon: Lavina Hamman, MD;  Location: [facility];  Service: Gynecology;  Laterality: N/A;    Family History  Problem Relation Age of Onset   Stroke Mother    Hypertension Father    Breast cancer Paternal Aunt    Social History:  reports that she has been smoking cigarettes. She has a 20 pack-year smoking history. She has never used smokeless tobacco. She reports that she does not drink alcohol and does not use drugs.  Allergies:  Allergies  Allergen Reactions   Bee Venom Anaphylaxis   Arthrotec [Diclofenac-Misoprostol] Other (See Comments)    Unknown reaction   Codeine Itching and Other (See Comments)    Headache    Cymbalta [  Duloxetine Hcl] Other (See Comments)    Altered mental state Confusion    Effexor [Venlafaxine Hydrochloride] Other (See Comments)    Confusion    Hydrocodone Itching and Other (See Comments)    Severe headache   Lexapro [Escitalopram Oxalate] Other (See Comments)    Confusion    Neurontin [Gabapentin] Other (See Comments)    Unknown reaction   Opana [Oxymorphone Hcl]  Other (See Comments)    Not effective   Provigil [Modafinil] Other (See Comments)    Unknown reaction   Talwin [Pentazocine] Other (See Comments)    Unknown reaction   Zoloft [Sertraline] Other (See Comments)    Increased depression    Medications Prior to Admission  Medication Sig Dispense Refill   acetaminophen (TYLENOL) 500 MG tablet Take 1 tablet (500 mg total) by mouth every 6 (six) hours as needed (pain). (Patient taking differently: Take 500 mg by mouth every 6 (six) hours as needed for headache (pain).) 30 tablet 0   albuterol (VENTOLIN HFA) 108 (90 Base) MCG/ACT inhaler Inhale 2 puffs into the lungs every 6 (six) hours as needed for wheezing or shortness of breath. 8 g 6   ALPRAZolam (XANAX) 1 MG tablet Take 1 mg by mouth 3 (three) times daily.     aspirin EC 81 MG tablet Take 81 mg by mouth at bedtime.     Cholecalciferol (VITAMIN D-3 PO) Take 1 capsule by mouth every evening.     loratadine (EQ ALL DAY ALLERGY RELIEF) 10 MG tablet Take 10 mg by mouth daily.     meloxicam (MOBIC) 15 MG tablet Take 15 mg by mouth at bedtime.     methocarbamol (ROBAXIN) 750 MG tablet Take 1 tablet (750 mg total) by mouth 3 (three) times daily. 30 tablet 0   polyethylene glycol powder (GLYCOLAX/MIRALAX) 17 GM/SCOOP powder Take 17 g by mouth daily. Drink 17g (1 scoop) dissolved in water per day. 255 g 0   pregabalin (LYRICA) 100 MG capsule Take 100 mg by mouth 3 (three) times daily.     Tiotropium Bromide Monohydrate (SPIRIVA RESPIMAT) 2.5 MCG/ACT AERS Inhale 2 puffs into the lungs daily. 4 g 5   traMADol (ULTRAM) 50 MG tablet Take 100 mg by mouth in the morning, at noon, and at bedtime.      Results for orders placed or performed during the hospital encounter of 10/01/23 (from the past 48 hour(s))  ABO/Rh     Status: None   Collection Time: 10/01/23  6:30 AM  Result Value Ref Range   ABO/RH(D)      A POS Performed at [facility], [address] [place], [place] [address]    No  results found.  Pertinent items noted in HPI and remainder of comprehensive ROS otherwise negative.  Blood pressure (!) 154/93, pulse 62, temperature 98.2 F (36.8 C), temperature source Oral, resp. rate 16, height 5\' 5"  (1.651 m), weight 75.6 kg, SpO2 95%.  Patient is awake and alert.  She is oriented and appropriate.  Speech is fluent.  Judgment insight are intact.  Cranial nerve function normal bilateral.  Motor examination feels intact motor strength bilateral sensory examination with some mild decrease sensation pinprick light touch in her left L2 dermatome.  Deep tendon reflexes are normal active.  No evidence of long track signs.  Gait and posture normal.  Examination head ears eyes nose throat is unremarked.  Chest and abdomen are benign. Assessment/Plan L2-3 adjacent level degeneration with degenerative spondylolisthesis and foraminal stenosis.  Plan left L2-3 anterior lateral retroperitoneal interbody decompression and fusion utilizing interbody cage, morselized allograft, and lateral plate instrumentation.  Risks and benefits been explained.  Patient wishes to proceed.  Sherilyn Cooter A Juanetta Negash 10/01/2023, 8:02 AM

## 2023-10-02 ENCOUNTER — Other Ambulatory Visit (HOSPITAL_COMMUNITY): Payer: Self-pay

## 2023-10-02 ENCOUNTER — Encounter (HOSPITAL_COMMUNITY): Payer: Self-pay | Admitting: Neurosurgery

## 2023-10-02 MED ORDER — TRAMADOL HCL 50 MG PO TABS
100.0000 mg | ORAL_TABLET | Freq: Three times a day (TID) | ORAL | 1 refills | Status: DC
  Filled 2023-10-02: qty 30, 5d supply, fill #0

## 2023-10-02 MED FILL — Thrombin For Soln 5000 Unit: CUTANEOUS | Qty: 5000 | Status: AC

## 2023-10-02 NOTE — Evaluation (Signed)
Physical Therapy Evaluation  Patient Details Name: Alyssa [name] MRN: [id number] DOB: September 07, 1956 Today's Date: 10/02/2023  History of Present Illness  Pt is a 67 y/o female who underwent L2-L3 ALIF on 10/01/23. PMHx: bright's disease, COPD, MI, tobacco use  Clinical Impression  Pt admitted with above diagnosis. At the time of PT eval, pt was able to demonstrate transfers and ambulation with gross supervision for safety and RW for support. Pt was educated on precautions, brace application/wearing schedule, appropriate activity progression, and car transfer. Pt currently with functional limitations due to the deficits listed below (see PT Problem List). Pt will benefit from skilled PT to increase their independence and safety with mobility to allow discharge to the venue listed below.          If plan is discharge home, recommend the following: A little help with walking and/or transfers;A little help with bathing/dressing/bathroom;Assistance with cooking/housework;Assist for transportation;Help with stairs or ramp for entrance   Can travel by private vehicle        Equipment Recommendations None recommended by PT  Recommendations for Other Services       Functional Status Assessment Patient has had a recent decline in their functional status and demonstrates the ability to make significant improvements in function in a reasonable and predictable amount of time.     Precautions / Restrictions Precautions Precautions: Back;Fall Precaution Booklet Issued: Yes (comment) Precaution Comments: Reviewed handout and pt was cued for precautions during functional mobility. Required Braces or Orthoses: Spinal Brace Spinal Brace: Lumbar corset;Applied in sitting position Restrictions Weight Bearing Restrictions: No      Mobility  Bed Mobility Overal bed mobility: Needs Assistance Bed Mobility: Sit to Sidelying         Sit to sidelying: Supervision General bed mobility comments: Good  technique with sit>sidelying. Pt declined attempting to get down to a low surface to simulate home environment. Educated on technique verbally.    Transfers Overall transfer level: Needs assistance Equipment used: Rolling walker (2 wheels) Transfers: Sit to/from Stand Sit to Stand: Supervision           General transfer comment: Pt demonstrated proper hand placement on seated surface for safety. VC's for wide BOS.    Ambulation/Gait Ambulation/Gait assistance: Supervision Gait Distance (Feet): 300 Feet Assistive device: Rolling walker (2 wheels) Gait Pattern/deviations: Step-through pattern, Decreased stride length, Trunk flexed Gait velocity: Decreased Gait velocity interpretation: 1.31 - 2.62 ft/sec, indicative of limited community ambulator   General Gait Details: VC's for improved posture, closer walker proximity and forward gaze. No assist required and no overt LOB noted.  Stairs            Wheelchair Mobility     Tilt Bed    Modified Rankin (Stroke Patients Only)       Balance Overall balance assessment: Needs assistance Sitting-balance support: Feet supported Sitting balance-Leahy Scale: Good     Standing balance support: No upper extremity supported, During functional activity Standing balance-Leahy Scale: Fair                               Pertinent Vitals/Pain Pain Assessment Pain Assessment: Faces Faces Pain Scale: Hurts little more Pain Location: BLEs with figure four position Pain Descriptors / Indicators: Discomfort Pain Intervention(s): Limited activity within patient's tolerance, Monitored during session, Repositioned    Home Living Family/patient expects to be discharged to:: Private residence Living Arrangements: Spouse/significant other;Children Available Help at Discharge: Family;Available PRN/intermittently  Type of Home: House Home Access: Stairs to enter;Ramped entrance   Entrance Stairs-Number of Steps: 2 in  front, ramp at the back   Home Layout: One level Home Equipment: Agricultural consultant (2 wheels);Cane - single point;BSC/3in1;Shower seat (equipment is dtr's but pt able use the DME as needed) Additional Comments: Pt states she "cannot count on them (family)" to assist at discharge. Pt sleeps on mattress+boxspring on the floor    Prior Function Prior Level of Function : Independent/Modified Independent;Driving             Mobility Comments: SPC intermittently ADLs Comments: Indep, drives. Able to get up/down from low bed height (mattress+boxspring on the floor) at baseline     Extremity/Trunk Assessment   Upper Extremity Assessment Upper Extremity Assessment: Generalized weakness    Lower Extremity Assessment Lower Extremity Assessment: Generalized weakness    Cervical / Trunk Assessment Cervical / Trunk Assessment: Back Surgery  Communication   Communication Communication: No apparent difficulties  Cognition Arousal: Alert Behavior During Therapy: Impulsive Overall Cognitive Status: Impaired/Different from baseline Area of Impairment: Safety/judgement, Memory                     Memory: Decreased recall of precautions   Safety/Judgement: Decreased awareness of deficits     General Comments: overall WFL for basic orientation, poor maintance of back precautions, impulsive        General Comments General comments (skin integrity, edema, etc.): VSS    Exercises     Assessment/Plan    PT Assessment Patient needs continued PT services  PT Problem List Decreased strength;Decreased activity tolerance;Decreased balance;Decreased mobility;Decreased knowledge of use of DME;Decreased safety awareness;Decreased knowledge of precautions;Pain       PT Treatment Interventions DME instruction;Gait training;Stair training;Functional mobility training;Therapeutic activities;Therapeutic exercise;Balance training;Patient/family education    PT Goals (Current goals can be  found in the Care Plan section)  Acute Rehab PT Goals Patient Stated Goal: Return home PT Goal Formulation: With patient Time For Goal Achievement: 10/09/23 Potential to Achieve Goals: Good    Frequency Min 5X/week     Co-evaluation               AM-PAC PT "6 Clicks" Mobility  Outcome Measure Help needed turning from your back to your side while in a flat bed without using bedrails?: A Little Help needed moving from lying on your back to sitting on the side of a flat bed without using bedrails?: A Little Help needed moving to and from a bed to a chair (including a wheelchair)?: A Little Help needed standing up from a chair using your arms (e.g., wheelchair or bedside chair)?: A Little Help needed to walk in hospital room?: A Little Help needed climbing 3-5 steps with a railing? : A Little 6 Click Score: 18    End of Session Equipment Utilized During Treatment: Gait belt;Back brace Activity Tolerance: Patient tolerated treatment well Patient left: in bed;with call bell/phone within reach Nurse Communication: Mobility status PT Visit Diagnosis: Unsteadiness on feet (R26.81);Pain Pain - part of body:  (back)    Time: 1610-9604 PT Time Calculation (min) (ACUTE ONLY): 16 min   Charges:   PT Evaluation $PT Eval Low Complexity: 1 Low   PT General Charges $$ ACUTE PT VISIT: 1 Visit         Conni Slipper, PT, DPT Acute Rehabilitation Services Secure Chat Preferred Office: [phone]   Marylynn Pearson 10/02/2023, 10:55 AM

## 2023-10-02 NOTE — Discharge Instructions (Addendum)
Wound Care Keep incision covered and dry for three days.   Do not put any creams, lotions, or ointments on incision. Leave steri-strips on back.  They will fall off by themselves. Activity Walk each and every day, increasing distance each day. No lifting greater than 8 lbs.  Avoid excessive back bending. No driving for 2 weeks; may ride as a passenger locally. If provided with back brace, wear when out of bed.  It is not necessary to wear brace in bed. Diet Resume your normal diet.   Call Your Doctor If Any of These Occur Redness, drainage, or swelling at the wound.  Temperature greater than 101 degrees. Severe pain not relieved by pain medication. Incision starts to come apart. Follow Up Appt Call  [phone])  for problems.  If you have any hardware placed in your spine, you will need an x-ray before your appointment.  

## 2023-10-02 NOTE — Discharge Summary (Signed)
Physician Discharge Summary  Patient ID: Alyssa [name] MRN: [id number] DOB/AGE: 68/10/1956 68 y.o.  Admit date: 10/01/2023 Discharge date: 10/02/2023  Admission Diagnoses:  Discharge Diagnoses:  Principal Problem:   Lumbar adjacent segment disease with spondylolisthesis   Discharged Condition: good  Hospital Course: Patient mated to the hospital where she went uncomplicated L2-3 decompression and fusion surgery.  Postoperative doing well.  Standing ambulating and voiding without difficulty.  Tolerating regular diet.  Back and radicular pain much improved.  Ready for discharge home.  Consults:   Significant Diagnostic Studies:   Treatments:   Discharge Exam: Blood pressure 122/67, pulse 77, temperature 98.5 F (36.9 C), temperature source Oral, resp. rate 16, height 5\' 5"  (1.651 m), weight 75.6 kg, SpO2 96%. Awake and alert.  Oriented and appropriate.  Motor and sensory function intact.  Wound is clean and dry.  Chest and abdomen benign.  Disposition: Discharge disposition: 01-Home or Self Care        Allergies as of 10/02/2023       Reactions   Bee Venom Anaphylaxis   Arthrotec [diclofenac-misoprostol] Other (See Comments)   Unknown reaction   Codeine Itching, Other (See Comments)   Headache    Cymbalta [duloxetine Hcl] Other (See Comments)   Altered mental state Confusion    Effexor [venlafaxine Hydrochloride] Other (See Comments)   Confusion    Hydrocodone Itching, Other (See Comments)   Severe headache   Lexapro [escitalopram Oxalate] Other (See Comments)   Confusion    Neurontin [gabapentin] Other (See Comments)   Unknown reaction   Opana [oxymorphone Hcl] Other (See Comments)   Not effective   Provigil [modafinil] Other (See Comments)   Unknown reaction   Talwin [pentazocine] Other (See Comments)   Unknown reaction   Zoloft [sertraline] Other (See Comments)   Increased depression        Medication List     TAKE these medications     acetaminophen 500 MG tablet Commonly known as: TYLENOL Take 1 tablet (500 mg total) by mouth every 6 (six) hours as needed (pain). What changed: reasons to take this   albuterol 108 (90 Base) MCG/ACT inhaler Commonly known as: VENTOLIN HFA Inhale 2 puffs into the lungs every 6 (six) hours as needed for wheezing or shortness of breath.   ALPRAZolam 1 MG tablet Commonly known as: XANAX Take 1 mg by mouth 3 (three) times daily.   aspirin EC 81 MG tablet Take 81 mg by mouth at bedtime.   EQ All Day Allergy Relief 10 MG tablet Generic drug: loratadine Take 10 mg by mouth daily.   meloxicam 15 MG tablet Commonly known as: MOBIC Take 15 mg by mouth at bedtime.   methocarbamol 750 MG tablet Commonly known as: ROBAXIN Take 1 tablet (750 mg total) by mouth 3 (three) times daily.   polyethylene glycol powder 17 GM/SCOOP powder Commonly known as: GLYCOLAX/MIRALAX Take 17 g by mouth daily. Drink 17g (1 scoop) dissolved in water per day.   pregabalin 100 MG capsule Commonly known as: LYRICA Take 100 mg by mouth 3 (three) times daily.   Spiriva Respimat 2.5 MCG/ACT Aers Generic drug: Tiotropium Bromide Monohydrate Inhale 2 puffs into the lungs daily.   traMADol 50 MG tablet Commonly known as: ULTRAM Take 100 mg by mouth in the morning, at noon, and at bedtime. What changed: Another medication with the same name was added. Make sure you understand how and when to take each.   traMADol 50 MG tablet Commonly known as: Alyssa [name]  Take 2 tablets (100 mg total) by mouth 3 (three) times daily. What changed: You were already taking a medication with the same name, and this prescription was added. Make sure you understand how and when to take each.   VITAMIN D-3 PO Take 1 capsule by mouth every evening.               Durable Medical Equipment  (From admission, onward)           Start     Ordered   10/01/23 1230  DME Walker rolling  Once       Question:  Patient needs a  walker to treat with the following condition  Answer:  Lumbar adjacent segment disease with spondylolisthesis   10/01/23 1229   10/01/23 1230  DME 3 n 1  Once        10/01/23 1229             Signed: Sherilyn Cooter A Yasira Engelson 10/02/2023, 8:09 AM

## 2023-10-02 NOTE — Progress Notes (Signed)
Patient alert and oriented, voiding adequately, skin clean, dry and intact without evidence of skin break down, or symptoms of complications - no redness or edema noted, only slight tenderness at site.  Patient states pain is manageable at time of discharge. Patient has an appointment with MD in 2 weeks 

## 2023-10-02 NOTE — Evaluation (Signed)
Occupational Therapy Evaluation Patient Details Name: Alyssa [name] MRN: [id number] DOB: 11-Sep-1956 Today's Date: 10/02/2023   History of Present Illness Alyssa [name]  67 y.o. female who underwent Extreme Lateral Interbody Fusion - left - Left two-Lumbar three - Interbody Fusion with lateral plating 10/1. PMHx: bright's disease, COPD, MI, tobacco use   Clinical Impression   Alyssa [name] was evaluated s/p the above spine surgery. She is indep and lives with family at baseline. Upon evaluation pt was limited by back precautions, impulsivity, BLE pain with ADLs and decreased activity tolerance. Overall she needed min A for LB ADLs and supervision A for mobility with RW. Maximal cues provided throughout, pt continued to not follow precautions during ADLs. Educated pt on AE to assist with LB ADLs, pt denied practice or home use.  Pt does not require further acute OT services as she has active plans to d/c home today. Recommend d/c home with support of family.         If plan is discharge home, recommend the following: A little help with walking and/or transfers;A little help with bathing/dressing/bathroom;Assistance with cooking/housework;Direct supervision/assist for medications management;Direct supervision/assist for financial management    Functional Status Assessment  Patient has had a recent decline in their functional status and demonstrates the ability to make significant improvements in function in a reasonable and predictable amount of time.  Equipment Recommendations  None recommended by OT       Precautions / Restrictions Precautions Precautions: Back;Fall Precaution Booklet Issued: Yes (comment) Required Braces or Orthoses: Spinal Brace Spinal Brace: Lumbar corset Restrictions Weight Bearing Restrictions: No      Mobility Bed Mobility Overal bed mobility: Needs Assistance             General bed mobility comments: OOB upon arrival, declined log roll practice     Transfers Overall transfer level: Needs assistance Equipment used: Rolling walker (2 wheels) Transfers: Sit to/from Stand Sit to Stand: Supervision                  Balance Overall balance assessment: Needs assistance Sitting-balance support: Feet supported Sitting balance-Leahy Scale: Good     Standing balance support: No upper extremity supported, During functional activity Standing balance-Leahy Scale: Fair                             ADL either performed or assessed with clinical judgement   ADL Overall ADL's : Needs assistance/impaired Eating/Feeding: Independent   Grooming: Supervision/safety;Cueing for compensatory techniques   Upper Body Bathing: Set up;Cueing for compensatory techniques   Lower Body Bathing: Minimal assistance;Sit to/from stand   Upper Body Dressing : Set up;Cueing for compensatory techniques;Sitting   Lower Body Dressing: Minimal assistance;Sit to/from stand Lower Body Dressing Details (indicate cue type and reason): unable to get into figure four position, denied AE Toilet Transfer: Supervision/safety;Ambulation;Rolling walker (2 wheels)   Toileting- Clothing Manipulation and Hygiene: Supervision/safety;Adhering to back precautions;Cueing for compensatory techniques       Functional mobility during ADLs: Supervision/safety;Rolling walker (2 wheels) General ADL Comments: maximal cues provided for back precautions and compensatory techniques, pt continued to break BLT. Unable to get into figure four position, educated on AE but pt denied use/practice. Pt also sleeps on a very low surface at baseline, but refused to attempt a low surface transfer     Vision Baseline Vision/History: 0 No visual deficits Vision Assessment?: No apparent visual deficits     Perception Perception: Not  tested       Praxis Praxis: Not tested       Pertinent Vitals/Pain Pain Assessment Pain Assessment: Faces Faces Pain Scale: Hurts little  more Pain Location: BLEs with figure four position Pain Descriptors / Indicators: Discomfort Pain Intervention(s): Limited activity within patient's tolerance, Monitored during session     Extremity/Trunk Assessment Upper Extremity Assessment Upper Extremity Assessment: Overall WFL for tasks assessed   Lower Extremity Assessment Lower Extremity Assessment: Defer to PT evaluation   Cervical / Trunk Assessment Cervical / Trunk Assessment: Back Surgery   Communication Communication Communication: No apparent difficulties   Cognition Arousal: Alert Behavior During Therapy: Impulsive Overall Cognitive Status: Impaired/Different from baseline Area of Impairment: Safety/judgement, Memory                     Memory: Decreased recall of precautions   Safety/Judgement: Decreased awareness of deficits     General Comments: overall WFL for basic orientation, poor maintance of back precautions, impulsive     General Comments  VSS    Exercises     Shoulder Instructions      Home Living Family/patient expects to be discharged to:: Private residence Living Arrangements: Spouse/significant other;Children Available Help at Discharge: Family;Available PRN/intermittently Type of Home: House Home Access: Stairs to enter;Ramped entrance Entrance Stairs-Number of Steps: 2 in front, ramp at the back   Home Layout: One level     Bathroom Shower/Tub: Producer, television/film/video: Handicapped height     Home Equipment: Agricultural consultant (2 wheels);Cane - single point;BSC/3in1;Shower seat (equipment is dtr's but pt able use the DME as needed)   Additional Comments: Pt states she "cannot count on them (family)" to assist at discharge). Pt sleeps on mattress+boxspring on the floor      Prior Functioning/Environment Prior Level of Function : Independent/Modified Independent;Driving             Mobility Comments: SPC intermittently ADLs Comments: Indep, drives. Able to  get up/down from low bed height (mattress+boxspring on the floor) at baseline        OT Problem List: Decreased strength;Decreased activity tolerance;Decreased range of motion;Impaired balance (sitting and/or standing);Decreased safety awareness;Decreased knowledge of use of DME or AE;Decreased knowledge of precautions;Decreased cognition      OT Treatment/Interventions:      OT Goals(Current goals can be found in the care plan section) Acute Rehab OT Goals Patient Stated Goal: home OT Goal Formulation: With patient Time For Goal Achievement: 10/16/23 Potential to Achieve Goals: Good  OT Frequency:      Co-evaluation              AM-PAC OT "6 Clicks" Daily Activity     Outcome Measure Help from another person eating meals?: None Help from another person taking care of personal grooming?: A Little Help from another person toileting, which includes using toliet, bedpan, or urinal?: A Little Help from another person bathing (including washing, rinsing, drying)?: A Little Help from another person to put on and taking off regular upper body clothing?: A Little Help from another person to put on and taking off regular lower body clothing?: A Little 6 Click Score: 19   End of Session Equipment Utilized During Treatment: Rolling walker (2 wheels);Back brace Nurse Communication: Mobility status  Activity Tolerance: Patient tolerated treatment well Patient left:    OT Visit Diagnosis: Unsteadiness on feet (R26.81);Other abnormalities of gait and mobility (R26.89);Muscle weakness (generalized) (M62.81)  Time: 1610-9604 OT Time Calculation (min): 17 min Charges:  OT General Charges $OT Visit: 1 Visit OT Evaluation $OT Eval Moderate Complexity: 1 Mod  Derenda Mis, OTR/L Acute Rehabilitation Services Office [phone] Secure Chat Communication Preferred   Donia Pounds 10/02/2023, 10:05 AM

## 2023-10-10 ENCOUNTER — Ambulatory Visit: Payer: Medicare Other | Admitting: Internal Medicine

## 2023-11-15 ENCOUNTER — Ambulatory Visit: Payer: Medicare Other | Admitting: Internal Medicine

## 2024-02-06 ENCOUNTER — Other Ambulatory Visit: Payer: Self-pay | Admitting: Emergency Medicine

## 2024-02-07 ENCOUNTER — Telehealth: Payer: Self-pay | Admitting: Emergency Medicine

## 2024-02-07 ENCOUNTER — Other Ambulatory Visit (HOSPITAL_COMMUNITY): Payer: Self-pay

## 2024-02-07 NOTE — Telephone Encounter (Signed)
 Patient went to pharmacy and her Spiriva  was too expensive. She would like an alternative.   Pharmacy: [facility] in [place]

## 2024-02-07 NOTE — Telephone Encounter (Signed)
 Called and spoke to patient. She stated that spiriva  is not affordable. She is requesting cheaper alternative.   PA team, please advise. Thanks

## 2024-02-07 NOTE — Telephone Encounter (Signed)
Dr. Byrum, please see below message and advise. Thanks ?

## 2024-02-07 NOTE — Telephone Encounter (Signed)
 Test claim shows the following results for alternatives in the  same class. Patient has a $340 deductible that has to be met.   Spiriva  [employer]  -  $334.83 [employer]  -  $356.20 [employer]  - non formulary

## 2024-02-10 NOTE — Telephone Encounter (Signed)
 They are all going to be expensive until she meets with $340 deductible.  I think that her current Spiriva  is probably stable best choice

## 2024-02-10 NOTE — Telephone Encounter (Signed)
 Lm x1 for patient.

## 2024-02-11 NOTE — Telephone Encounter (Signed)
Pt is aware of below message and voiced her understanding. She stated that she can afford 340 dollars. She stated that she will continue albuterol PRN.    Routing to MR as an Financial planner

## 2024-02-11 NOTE — Telephone Encounter (Signed)
Patient is returning phone call. Patient phone number is [phone].

## 2024-03-03 ENCOUNTER — Ambulatory Visit: Payer: Medicare Other | Admitting: Internal Medicine

## 2024-03-06 ENCOUNTER — Ambulatory Visit: Payer: Medicare Other | Admitting: Internal Medicine
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
## 2024-04-03 ENCOUNTER — Ambulatory Visit: Attending: Internal Medicine | Admitting: Internal Medicine

## 2024-04-03 ENCOUNTER — Encounter: Payer: Self-pay | Admitting: Internal Medicine

## 2024-04-03 DIAGNOSIS — Z87898 Personal history of other specified conditions: Secondary | ICD-10-CM

## 2024-04-03 NOTE — Patient Instructions (Signed)
 Medication Instructions:  Your physician recommends that you continue on your current medications as directed. Please refer to the Current Medication list given to you today.  *If you need a refill on your cardiac medications before your next appointment, please call your pharmacy*  Follow-Up: At [facility], you and your health needs are our priority.  As part of our continuing mission to provide you with exceptional heart care, our providers are all part of one team.  This team includes your primary Cardiologist (physician) and Advanced Practice Providers or APPs (Physician Assistants and Nurse Practitioners) who all work together to provide you with the care you need, when you need it.  Your next appointment:   6 month(s)  Provider:   Any APP  Other Instructions    1st Floor: - Lobby - Registration  - Pharmacy  - Lab - Cafe  2nd Floor: - PV Lab - Diagnostic Testing (echo, CT, nuclear med)  3rd Floor: - Vacant  4th Floor: - TCTS (cardiothoracic surgery) - AFib Clinic - Structural Heart Clinic - Vascular Surgery  - Vascular Ultrasound  5th Floor: - [facility] Cardiology (general and EP) - Clinical Pharmacy for coumadin, hypertension, lipid, weight-loss medications, and med management appointments    Valet parking services will be available as well.

## 2024-04-07 ENCOUNTER — Other Ambulatory Visit: Payer: Self-pay | Admitting: Obstetrics and Gynecology

## 2024-04-07 DIAGNOSIS — Z1231 Encounter for screening mammogram for malignant neoplasm of breast: Secondary | ICD-10-CM

## 2024-04-23 ENCOUNTER — Ambulatory Visit
Admission: RE | Admit: 2024-04-23 | Discharge: 2024-04-23 | Disposition: A | Discharge status: Home / Self Care | Source: Ambulatory Visit | Attending: Obstetrics and Gynecology | Admitting: Obstetrics and Gynecology

## 2024-04-23 DIAGNOSIS — Z1231 Encounter for screening mammogram for malignant neoplasm of breast: Secondary | ICD-10-CM

## 2024-05-01 ENCOUNTER — Encounter: Payer: Self-pay | Admitting: Internal Medicine

## 2024-05-01 ENCOUNTER — Ambulatory Visit

## 2024-05-01 VITALS — Ht 65.0 in | Wt 156.4 lb

## 2024-05-01 DIAGNOSIS — Z1211 Encounter for screening for malignant neoplasm of colon: Secondary | ICD-10-CM

## 2024-05-01 MED ORDER — NA SULFATE-K SULFATE-MG SULF 17.5-3.13-1.6 GM/177ML PO SOLN
1.0000 | Freq: Once | ORAL | 0 refills | Status: AC
Start: 2024-05-01 — End: 2024-05-01

## 2024-05-01 NOTE — Progress Notes (Signed)
 No egg or soy allergy known to patient  No issues known to pt with past sedation with any surgeries or procedures Patient denies ever being told they had issues or difficulty with intubation  No FH of Malignant Hyperthermia Pt is not on diet pills Pt is not on  home 02  Pt is not on blood thinners  Pt denies issues with constipation  No A fib or A flutter Have any cardiac testing pending--no  LOA: independent  Prep: suprep   Patient's chart reviewed by Rogena Class CNRA prior to previsit and patient appropriate for the [facility].  Previsit completed and red dot placed by patient's name on their procedure day (on provider's schedule).     PV completed with patient. Prep instructions sent via mychart and hard copy given at [facility] apt,

## 2024-05-12 ENCOUNTER — Telehealth (HOSPITAL_BASED_OUTPATIENT_CLINIC_OR_DEPARTMENT_OTHER): Payer: Self-pay

## 2024-05-12 NOTE — Telephone Encounter (Signed)
 Please advise as pt states Spiriva  is too expensive  Copied from CRM [id number]. Topic: Appointments - Appointment Scheduling >> May 12, 2024 12:54 PM Alyssa [name] wrote: Attempted to schedule appt with dr Alyssa [name]. Patient insists she has to see him in June as he is monitoring her lung noodles.   States she cannot get her inhaler because it cost too much money and she's trying to manage without it. Asking for samples. Is there another option?  (Denies having symptoms today) Mobile [phone]

## 2024-05-14 NOTE — Telephone Encounter (Signed)
 She should be having a lung cancer screening scan in June. We can review at next available OV, and also by phone or MyChart if needed.   She is supposed to be on Spiriva  based on last notes. ? Whether she is paying a medication deductible, or whether there is a better formulary alternative.

## 2024-05-14 NOTE — Telephone Encounter (Signed)
 Atc x1 no answer- left detailed vm to call back.

## 2024-05-19 ENCOUNTER — Telehealth: Payer: Self-pay | Admitting: *Deleted

## 2024-05-19 NOTE — Telephone Encounter (Signed)
 Copied from CRM [id number]. Topic: Clinical - Lab/Test Results >> May 19, 2024 12:55 PM Alyssa [name] B wrote: Reason for CRM: Patient stated she tried to call and schedule her CT but was told she needed an order and should call back to Dr Lacinda Pica office.  Patient would like a call to schedule CT.  Called pt and there was no answer- LMTCB

## 2024-05-19 NOTE — Telephone Encounter (Signed)
 Copied from CRM [id number]. Topic: Clinical - Request for Lab/Test Order >> May 19, 2024  1:18 PM Alyssa [name] wrote: Reason for CRM:   Pt is contacting clinic to request order be placed for a chest CT scan. She attempted to schedule it today but was advised an order would need to be placed prior to scheduling.  CB#  [phone]  Duplicate

## 2024-05-20 NOTE — Telephone Encounter (Signed)
 Looks like you left pt a message to return call for lung screening CT

## 2024-05-22 ENCOUNTER — Ambulatory Visit (AMBULATORY_SURGERY_CENTER): Admitting: Internal Medicine

## 2024-05-22 ENCOUNTER — Encounter: Payer: Self-pay | Admitting: Internal Medicine

## 2024-05-22 VITALS — BP 139/78 | HR 61 | Temp 97.1°F | Resp 12 | Ht 65.0 in | Wt 156.4 lb

## 2024-05-22 DIAGNOSIS — D123 Benign neoplasm of transverse colon: Secondary | ICD-10-CM

## 2024-05-22 DIAGNOSIS — K5730 Diverticulosis of large intestine without perforation or abscess without bleeding: Secondary | ICD-10-CM | POA: Diagnosis not present

## 2024-05-22 DIAGNOSIS — D122 Benign neoplasm of ascending colon: Secondary | ICD-10-CM | POA: Diagnosis not present

## 2024-05-22 DIAGNOSIS — Z1211 Encounter for screening for malignant neoplasm of colon: Secondary | ICD-10-CM

## 2024-05-22 MED ORDER — SODIUM CHLORIDE 0.9 % IV SOLN: 500.0000 mL | Freq: Once | INTRAVENOUS | Status: AC

## 2024-05-22 NOTE — Progress Notes (Signed)
 Sedate, gd SR, tolerated procedure well, VSS, report to RN

## 2024-05-22 NOTE — Op Note (Signed)
 [facility] Patient Name: Alyssa [name] Procedure Date: 05/22/2024 2:48 PM MRN: [id number] Endoscopist: Murel Arlington. Elvin Hammer , MD, [id number] Age: 68 Referring MD:  Date of Birth: 1956/03/07 Gender: Female Account #: [id number] Procedure:                Colonoscopy with cold snare polypectomy x 3 Indications:              Screening for colorectal malignant neoplasm Medicines:                Monitored Anesthesia Care Procedure:                Pre-Anesthesia Assessment:                           - Prior to the procedure, a History and Physical                            was performed, and patient medications and                            allergies were reviewed. The patient's tolerance of                            previous anesthesia was also reviewed. The risks                            and benefits of the procedure and the sedation                            options and risks were discussed with the patient.                            All questions were answered, and informed consent                            was obtained. Prior Anticoagulants: The patient has                            taken no anticoagulant or antiplatelet agents.                            After reviewing the risks and benefits, the patient                            was deemed in satisfactory condition to undergo the                            procedure.                           After obtaining informed consent, the colonoscope                            was passed under direct vision. Throughout the  procedure, the patient's blood pressure, pulse, and                            oxygen saturations were monitored continuously. The                            PCF-[id number] Slim SN [id number] was introduced through                            the anus and advanced to the the cecum, identified                            by appendiceal orifice and ileocecal valve. The                             ileocecal valve, appendiceal orifice, and rectum                            were photographed. The quality of the bowel                            preparation was excellent. The colonoscopy was                            performed without difficulty. The patient tolerated                            the procedure well. The bowel preparation used was                            SUPREP via split dose instruction. Scope In: 3:03:01 PM Scope Out: 3:16:49 PM Scope Withdrawal Time: 0 hours 10 minutes 30 seconds  Total Procedure Duration: 0 hours 13 minutes 48 seconds  Findings:                 Three sessile polyps were found in the transverse                            colon and ascending colon. The polyps were 4 to 5                            mm in size. These polyps were removed with a cold                            snare. Resection and retrieval were complete.                           Multiple diverticula were found in the entire                            colon. There was rectosigmoid stenosis.                           The exam was otherwise  without abnormality on                            direct and retroflexion views. Complications:            No immediate complications. Estimated blood loss:                            None. Estimated Blood Loss:     Estimated blood loss: none. Impression:               - Three 4 to 5 mm polyps in the transverse colon                            and in the ascending colon, removed with a cold                            snare. Resected and retrieved.                           - Diverticulosis in the entire examined colon.                           - The examination was otherwise normal on direct                            and retroflexion views. Recommendation:           - Repeat colonoscopy in 5 years for surveillance.                            Recommend ULTRASLIM PEDIATRIC SCOPE                           - Patient has a contact number available for                             emergencies. The signs and symptoms of potential                            delayed complications were discussed with the                            patient. Return to normal activities tomorrow.                            Written discharge instructions were provided to the                            patient.                           - Resume previous diet.                           - Continue present medications.                           -  Await pathology results. Murel Arlington. Elvin Hammer, MD 05/22/2024 3:23:06 PM This report has been signed electronically.

## 2024-05-22 NOTE — Progress Notes (Signed)
 HISTORY OF PRESENT ILLNESS:  Alyssa [name] is a 68 y.o. female sent for screening colonoscopy.  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   Nerve damage    to back and legs   Primary localized osteoarthrosis, lower leg    oa   Pulmonary nodules    Tobacco abuse    Wears dentures top   Wears glasses     Past Surgical History:  Procedure Laterality Date   ABDOMINAL HYSTERECTOMY  yrs ago   1 ovary left   ANTERIOR AND POSTERIOR REPAIR N/A 05/30/2021   Procedure: ANTERIOR (CYSTOCELE) AND POSTERIOR REPAIR (RECTOCELE);  Surgeon: Cyd Dowse, MD;  Location: [facility];  Service: Gynecology;  Laterality: N/A;   ANTERIOR CERVICAL DECOMP/DISCECTOMY FUSION N/A 09/10/2022   Procedure: Anterior Cervical Decompression Fusion - Cervical five-Cervical six;  Surgeon: Agustina Aldrich, MD;  [facility];  Service: Neurosurgery;  Laterality: N/A;   ANTERIOR LATERAL LUMBAR FUSION WITH PERCUTANEOUS SCREW 1 LEVEL Left 10/01/2023   Procedure: Extreme Lateral Interbody Fusion - left - Left two-Lumbar three - Interbody Fusion with lateral plating;  Surgeon: Agustina Aldrich, MD;  Location: [facility] OR;  Service: Neurosurgery;  Laterality: Left;   BACK SURGERY  yrs ago   x3 bolts  and screws and rods x 2 lower back   BREAST BIOPSY Left    COLPOCLEISIS N/A 03/22/2022   Procedure: EXAM UNDER ANESTHESIA, COLPOCLEISIS; PERINEORRHAPHY;  Surgeon: Arma Lamp, MD;  Location: [facility];  Service: Gynecology;  Laterality: N/A;    CYSTOSCOPY N/A 03/22/2022   Procedure: CYSTOSCOPY;  Surgeon: Arma Lamp, MD;  Location: [facility];  Service: Gynecology;  Laterality: N/A;   HEMORRHOID SURGERY  yrs ago   LEFT HEART CATH AND CORONARY ANGIOGRAPHY N/A 03/08/2022   Procedure: LEFT HEART CATH AND CORONARY ANGIOGRAPHY;  Surgeon: Odie Benne, MD;  Location: [facility];  Service: Cardiovascular;  Laterality: N/A;   ROBOTIC ASSISTED LAPAROSCOPIC SACROCOLPOPEXY N/A 02/13/2022   Procedure: XI ROBOTIC ASSISTED LAPAROSCOPIC LYSIS OF ADHESIONS;  Surgeon: Arma Lamp, MD;  Location: [facility];  Service: Gynecology;  Laterality: N/A;   TONSILLECTOMY     VAGINAL PROLAPSE REPAIR N/A 10/19/2021   Procedure: VAGINAL VAULT SUSPENSION;  Surgeon: Cyd Dowse, MD;  Location: [facility];  Service: Gynecology;  Laterality: N/A;   VULVAR LESION REMOVAL N/A 05/30/2021   Procedure: REMOVAL OF VULVAR CYST;  Surgeon: Cyd Dowse, MD;  Location: [facility];  Service: Gynecology;  Laterality: N/A;    Social History ELIM PEALE  reports that she has been smoking cigarettes. She has a 20 pack-year smoking history. She has never used smokeless tobacco. She reports that she does not drink alcohol and does not use drugs.  family history includes Breast cancer (age of onset: 12 - 51) in her paternal aunt; Hypertension in her father; Stroke in her mother.  Allergies  Allergen Reactions   Bee Venom Anaphylaxis   Arthrotec [Diclofenac-Misoprostol] Other (See  Comments)    Unknown reaction   Chlorpheniramine Maleate Other (See Comments)   Codeine Itching and Other (See Comments)    Headache    Cymbalta [Duloxetine Hcl] Other (See Comments)    Altered mental state Confusion    Effexor [Venlafaxine Hydrochloride] Other (See Comments)    Confusion    Hydrocodone  Itching and Other (See Comments)    Severe headache   Lactate Other (See Comments)     GI upset   Lexapro [Escitalopram Oxalate] Other (See Comments)    Confusion    Neurontin [Gabapentin] Other (See Comments)    Unknown reaction   Opana [Oxymorphone Hcl] Other (See Comments)    Not effective   Provigil [Modafinil] Other (See Comments)    Unknown reaction   Pseudoephedrine Hcl Other (See Comments)    "Makes me nervous"   Talwin [Pentazocine] Other (See Comments)    Unknown reaction   Zoloft [Sertraline] Other (See Comments)    Increased depression       PHYSICAL EXAMINATION: Vital signs: BP (!) 141/66   Pulse 69   Temp (!) 97.1 F (36.2 C) (Temporal)   Ht 5\' 5"  (1.651 m)   Wt 156 lb 6.4 oz (70.9 kg)   SpO2 97%   BMI 26.03 kg/m  General: Well-developed, well-nourished, no acute distress HEENT: Sclerae are anicteric, conjunctiva pink. Oral mucosa intact Lungs: Clear Heart: Regular Abdomen: soft, nontender, nondistended, no obvious ascites, no peritoneal signs, normal bowel sounds. No organomegaly. Extremities: No edema Psychiatric: alert and oriented x3. Cooperative     ASSESSMENT:  Colon cancer screening   PLAN:  Screening colonoscopy

## 2024-05-22 NOTE — Progress Notes (Signed)
 Called to room to assist during endoscopic procedure.  Patient ID and intended procedure confirmed with present staff. Received instructions for my participation in the procedure from the performing physician.

## 2024-05-22 NOTE — Patient Instructions (Signed)

## 2024-05-22 NOTE — Progress Notes (Signed)
 Pt's states no medical or surgical changes since previsit or office visit.

## 2024-05-26 ENCOUNTER — Telehealth: Payer: Self-pay | Admitting: *Deleted

## 2024-05-26 NOTE — Telephone Encounter (Signed)
  Follow up Call-     05/22/2024    2:18 PM  Call back number  Post procedure Call Back phone  # [phone]  Permission to leave phone message Yes     Patient questions:  Message left to call us  if necessary.

## 2024-05-28 ENCOUNTER — Ambulatory Visit: Payer: Self-pay | Admitting: Internal Medicine

## 2024-05-28 LAB — SURGICAL PATHOLOGY

## 2024-06-02 ENCOUNTER — Telehealth: Payer: Self-pay | Admitting: Acute Care

## 2024-06-02 DIAGNOSIS — Z122 Encounter for screening for malignant neoplasm of respiratory organs: Secondary | ICD-10-CM

## 2024-06-02 DIAGNOSIS — F17210 Nicotine dependence, cigarettes, uncomplicated: Secondary | ICD-10-CM

## 2024-06-02 DIAGNOSIS — Z87891 Personal history of nicotine dependence: Secondary | ICD-10-CM

## 2024-06-02 NOTE — Telephone Encounter (Signed)
 Patient has been scheduled for SDMV and LDCT. Will close encounter.

## 2024-06-02 NOTE — Telephone Encounter (Signed)
 Lung Cancer Screening Narrative/Criteria Questionnaire (Cigarette Smokers Only- No Cigars/Pipes/vapes)   AILANA CUADRADO   SDMV:06/15/2024 9:30 [place]        1956/11/14   LDCT: 06/16/2024 10:40 GI    68 y.o.   Phone: [phone]  Lung Screening Narrative (confirm age 54-77 yrs Medicare / 50-80 yrs Private pay insurance)   Insurance information:[employer] mcr   Referring Provider:Dr. Baldwin Levee   This screening involves an initial phone call with a team member from our program. It is called a shared decision making visit. The initial meeting is required by  insurance and Medicare to make sure you understand the program. This appointment takes about 15-20 minutes to complete. You will complete the screening scan at your scheduled date/time.  This scan takes about 5-10 minutes to complete. You can eat and drink normally before and after the scan.  Criteria questions for Lung Cancer Screening:   Are you a current or former smoker? Current Age began smoking: 68yo   If you are a former smoker, what year did you quit smoking? Patient quit for 1.5 years then started back again. (within 15 yrs)   To calculate your smoking history, I need an accurate estimate of how many packs of cigarettes you smoked per day and for how many years. (Not just the number of PPD you are now smoking)   Years smoking 50.5 x Packs per day 1/2 = Pack years 25.25   (at least 20 pack yrs)   (Make sure they understand that we need to know how much they have smoked in the past, not just the number of PPD they are smoking now)  Do you have a personal history of cancer?  No    Do you have a family history of cancer? No  Are you coughing up blood?  No  Have you had unexplained weight loss of 15 lbs or more in the last 6 months? No  It looks like you meet all criteria.  When would be a good time for us  to schedule you for this screening?   Additional information: N/A

## 2024-06-12 ENCOUNTER — Telehealth: Payer: Self-pay

## 2024-06-12 MED ORDER — SPIRIVA RESPIMAT 2.5 MCG/ACT IN AERS: 2.0000 | INHALATION_SPRAY | Freq: Every day | RESPIRATORY_TRACT | Status: DC

## 2024-06-12 NOTE — Telephone Encounter (Signed)
 Copied from CRM [id number]. Topic: Clinical - Medication Question >> Jun 11, 2024 12:21 PM Chantha C wrote: Reason for CRM: Patient is asking for samples of Tiotropium Bromide  Monohydrate (SPIRIVA  RESPIMAT) 2.5 MCG/ACT AERS? Patient cannot afford it and has not used it in a few months. Please advise and call back [phone].   Called and spoke to patient. She stated that spiriva  is not affordable and she has been without it. She is requesting a sample.  One sample has been placed up front for pickup. She stated that she would come by on Monday to pick it up.  She is due for an appt, however Dr. Baldwin Levee does not have any availability and his schedule is not out for Aug yet.  I offered appt with NP and she declined as she prefers to see Dr. Baldwin Levee.  Pharmacy team, will you riun a test claim for a cheaper alternative? Thank you

## 2024-06-15 ENCOUNTER — Other Ambulatory Visit (HOSPITAL_COMMUNITY): Payer: Self-pay

## 2024-06-15 ENCOUNTER — Telehealth: Payer: Self-pay

## 2024-06-15 ENCOUNTER — Encounter: Payer: Self-pay | Admitting: Adult Health

## 2024-06-15 ENCOUNTER — Ambulatory Visit (INDEPENDENT_AMBULATORY_CARE_PROVIDER_SITE_OTHER): Admitting: Adult Health

## 2024-06-15 DIAGNOSIS — F17210 Nicotine dependence, cigarettes, uncomplicated: Secondary | ICD-10-CM | POA: Diagnosis not present

## 2024-06-15 NOTE — Progress Notes (Signed)
  Virtual Visit via Telephone Note  I connected with Alyssa [name] , 06/15/24 9:44 AM by a telemedicine application and verified that I am speaking with the correct person using two identifiers.  Location: Patient: home Provider: home   I discussed the limitations of evaluation and management by telemedicine and the availability of in person appointments. The patient expressed understanding and agreed to proceed.   Shared Decision Making Visit Lung Cancer Screening Program [id number])   Eligibility: 68 y.o. Pack Years Smoking History Calculation = 25 pack years  (# packs/per year x # years smoked) Recent History of coughing up blood  no Unexplained weight loss? no ( >Than 15 pounds within the last 6 months ) Prior History Lung / other cancer no (Diagnosis within the last 5 years already requiring surveillance chest CT Scans). Smoking Status Current Smoker   Visit Components: Discussion included one or more decision making aids. YES Discussion included risk/benefits of screening. YES Discussion included potential follow up diagnostic testing for abnormal scans. YES Discussion included meaning and risk of over diagnosis. YES Discussion included meaning and risk of False Positives. YES Discussion included meaning of total radiation exposure. YES  Counseling Included: Importance of adherence to annual lung cancer LDCT screening. YES Impact of comorbidities on ability to participate in the program. YES Ability and willingness to under diagnostic treatment. YES  Smoking Cessation Counseling: Current Smokers:  Discussed importance of smoking cessation. yes Information about tobacco cessation classes and interventions provided to patient. yes Patient provided with ticket for LDCT Scan. yes Symptomatic Patient. NO Diagnosis Code: Tobacco Use Z72.0 Asymptomatic Patient yes  Counseling - 4 minutes of smoking cessation counseling (CT Chest Lung Cancer Screening Low Dose W/O CM)  [id number]  Z12.2-Screening of respiratory organs Z87.891-Personal history of nicotine  dependence   Cullen Dose 06/15/24

## 2024-06-15 NOTE — Patient Instructions (Signed)

## 2024-06-15 NOTE — Telephone Encounter (Signed)
 Pt is aware of below message and voiced her understanding.  She will pickup sample today.  Nothing further needed.

## 2024-06-15 NOTE — Telephone Encounter (Signed)
 Patient has a deductible to meet before her prices will go down-   Spiriva  Respimat- $359.80 BranSpiriva  Handihaler- $332.27 Incruse Ellipta - $351.47

## 2024-06-16 ENCOUNTER — Other Ambulatory Visit: Payer: Self-pay | Admitting: Orthopaedic Surgery

## 2024-06-16 ENCOUNTER — Ambulatory Visit
Admission: RE | Admit: 2024-06-16 | Discharge: 2024-06-16 | Disposition: A | Discharge status: Home / Self Care | Source: Ambulatory Visit | Attending: Acute Care | Admitting: Acute Care

## 2024-06-16 DIAGNOSIS — M25511 Pain in right shoulder: Secondary | ICD-10-CM

## 2024-06-16 DIAGNOSIS — Z122 Encounter for screening for malignant neoplasm of respiratory organs: Secondary | ICD-10-CM

## 2024-06-16 DIAGNOSIS — Z87891 Personal history of nicotine dependence: Secondary | ICD-10-CM

## 2024-06-16 DIAGNOSIS — F17210 Nicotine dependence, cigarettes, uncomplicated: Secondary | ICD-10-CM

## 2024-06-19 ENCOUNTER — Telehealth: Payer: Self-pay

## 2024-06-19 NOTE — Telephone Encounter (Signed)
...     Pre-operative Risk Assessment    Patient Name: Alyssa [name]  DOB: 1956/09/09 MRN: [id number]   Date of last office visit: 04/03/24 Date of next office visit: NONE   Request for Surgical Clearance     Procedure:  RIGHT REVERSE TOTAL SHOULDER ARTHROPLASTY  Date of Surgery:  Clearance TBD                                  Surgeon:  DR Grafton Lawrence Surgeon's Group or Practice Name:  [facility] Phone number:  [phone] Fax number:  [phone]   Type of Clearance Requested:   - Medical  - Pharmacy:  Hold Aspirin      Type of Anesthesia:  General  WITH INTERSCALENE BLOCK   Additional requests/questions:    Montel Antu   06/19/2024, 10:16 AM

## 2024-06-19 NOTE — Telephone Encounter (Signed)
   Name: Alyssa [name]  DOB: 10-24-1956  MRN: [id number]  Primary Cardiologist: Bridgette Campus, MD (Inactive)   Preoperative team, please contact this patient and set up a phone call appointment for further preoperative risk assessment. Please obtain consent and complete medication review. Thank you for your help. Last seen by Dr. Alois Arnt on 04/03/2024.  I confirm that guidance regarding antiplatelet and oral anticoagulation therapy has been completed and, if necessary, noted below.Per office protocol, if patient is without any new symptoms or concerns at the time of their virtual visit, he/she may hold aspirin  for 7 days prior to procedure. Please resume aspirin  as soon as possible postprocedure, at the discretion of the surgeon.    I also confirmed the patient resides in the state of [place] . As per [facility] Medical Board telemedicine laws, the patient must reside in the state in which the provider is licensed.   Friddie Jetty, NP 06/19/2024, 10:45 AM [facility]

## 2024-06-19 NOTE — Telephone Encounter (Signed)
 Left message to call back to schedule tele pre op appt.

## 2024-06-22 ENCOUNTER — Telehealth: Payer: Self-pay

## 2024-06-22 ENCOUNTER — Telehealth: Payer: Self-pay | Admitting: Emergency Medicine

## 2024-06-22 NOTE — Telephone Encounter (Signed)
 [name] has an opening 7/16 that we can overide for her. E2C2 send to front desk please. LM for PT and sent MYCHART.

## 2024-06-22 NOTE — Telephone Encounter (Signed)
 Fax received from Dr.Dax Cristy with [facility] to perform a right reverse total shoulder arthroplasty with interscalene block on patient.  Patient needs surgery clearance. Surgery is pending. Patient was seen on 07/16/24. Office protocol is a risk assessment can be sent to surgeon if patient has been seen in 60 days or less.    Pt will need appt for risk assessment.   I am unable to schedule appt bc I can not override same day or HFU slot. This pt needs appt with [place] or APP for risk assessment- routing to admin pool to get her scheduled.   Please route back to surgical clearance pool when appt scheduled, thanks!

## 2024-06-22 NOTE — Telephone Encounter (Signed)
2nd attempt to reach pt regarding surgical clearance and the need for a tele visit.  Left a message for pt to call back and ask for the preop team.

## 2024-06-22 NOTE — Telephone Encounter (Signed)
 error

## 2024-06-23 ENCOUNTER — Telehealth: Payer: Self-pay | Admitting: *Deleted

## 2024-06-23 NOTE — Telephone Encounter (Signed)
 Pt called back and has been scheduled tele preop appt 07/01/24. Med rec and consent are done.

## 2024-06-23 NOTE — Telephone Encounter (Signed)
 3rd attempt to reach pt to schedule tele preop appt. I will update the requesting office the pt will need to call [phone] and s/w the preop team to set up tele preop appt.   In the meantime I will remove from the preop call back.

## 2024-06-23 NOTE — Telephone Encounter (Signed)
 Pt called back and has been scheduled tele preop appt 07/01/24. Med rec and consent are done.      Patient Consent for Virtual Visit        Alyssa [name] has provided verbal consent on 06/23/2024 for a virtual visit (video or telephone).   CONSENT FOR VIRTUAL VISIT FOR:  Alyssa [name]  By participating in this virtual visit I agree to the following:  I hereby voluntarily request, consent and authorize [facility] and its employed or contracted physicians, physician assistants, nurse practitioners or other licensed health care professionals (the Practitioner), to provide me with telemedicine health care services (the "Services) as deemed necessary by the treating Practitioner. I acknowledge and consent to receive the Services by the Practitioner via telemedicine. I understand that the telemedicine visit will involve communicating with the Practitioner through live audiovisual communication technology and the disclosure of certain medical information by electronic transmission. I acknowledge that I have been given the opportunity to request an in-person assessment or other available alternative prior to the telemedicine visit and am voluntarily participating in the telemedicine visit.  I understand that I have the right to withhold or withdraw my consent to the use of telemedicine in the course of my care at any time, without affecting my right to future care or treatment, and that the Practitioner or I may terminate the telemedicine visit at any time. I understand that I have the right to inspect all information obtained and/or recorded in the course of the telemedicine visit and may receive copies of available information for a reasonable fee.  I understand that some of the potential risks of receiving the Services via telemedicine include:  Delay or interruption in medical evaluation due to technological equipment failure or disruption; Information transmitted may not be sufficient  (e.g. poor resolution of images) to allow for appropriate medical decision making by the Practitioner; and/or  In rare instances, security protocols could fail, causing a breach of personal health information.  Furthermore, I acknowledge that it is my responsibility to provide information about my medical history, conditions and care that is complete and accurate to the best of my ability. I acknowledge that Practitioner's advice, recommendations, and/or decision may be based on factors not within their control, such as incomplete or inaccurate data provided by me or distortions of diagnostic images or specimens that may result from electronic transmissions. I understand that the practice of medicine is not an exact science and that Practitioner makes no warranties or guarantees regarding treatment outcomes. I acknowledge that a copy of this consent can be made available to me via my patient portal [facility] MyChart), or I can request a printed copy by calling the office of [facility].    I understand that my insurance will be billed for this visit.   I have read or had this consent read to me. I understand the contents of this consent, which adequately explains the benefits and risks of the Services being provided via telemedicine.  I have been provided ample opportunity to ask questions regarding this consent and the Services and have had my questions answered to my satisfaction. I give my informed consent for the services to be provided through the use of telemedicine in my medical care

## 2024-06-25 NOTE — Telephone Encounter (Signed)
 Called and spoke with patient, she received the message about the appointment on 7/16 at 1:30 pm and this time works for her.  Advised her to arrive by 1:15 pm for check in.  She verbalized understanding.  Nothing further needed.

## 2024-06-29 ENCOUNTER — Ambulatory Visit
Admission: RE | Admit: 2024-06-29 | Discharge: 2024-06-29 | Disposition: A | Discharge status: Home / Self Care | Source: Ambulatory Visit | Attending: Orthopaedic Surgery

## 2024-06-29 DIAGNOSIS — M25511 Pain in right shoulder: Secondary | ICD-10-CM

## 2024-06-30 NOTE — Telephone Encounter (Signed)
 ATC x2. Ldvm Pt has ov 7/16 closing encounter.

## 2024-07-01 ENCOUNTER — Ambulatory Visit: Attending: Cardiology | Admitting: Emergency Medicine

## 2024-07-01 ENCOUNTER — Telehealth: Payer: Self-pay | Admitting: Primary Care

## 2024-07-01 DIAGNOSIS — Z01810 Encounter for preprocedural cardiovascular examination: Secondary | ICD-10-CM

## 2024-07-01 NOTE — Progress Notes (Signed)
 Virtual Visit via Telephone Note   Because of Alyssa [name] co-morbid illnesses, she is at least at moderate risk for complications without adequate follow up.  This format is felt to be most appropriate for this patient at this time.  Due to technical limitations with video connection Web designer), today's appointment will be conducted as an audio only telehealth visit, and Alyssa [name] verbally agreed to proceed in this manner.   All issues noted in this document were discussed and addressed.  No physical exam could be performed with this format.  Evaluation Performed:  Preoperative cardiovascular risk assessment _____________   Date:  07/01/2024   Patient ID:  Alyssa [name], DOB June 12, 1956, MRN [id number] Patient Location:  Home Provider location:   Office  Primary Care Provider:  Shepard Ade, MD Primary Cardiologist:  Alvan Ronal BRAVO, MD (Inactive)  Chief Complaint / Patient Profile   68 y.o. y/o female with a h/o nonobstructive CAD, abnormal EKG, syncope, polysubstance abuse, COPD, chronic pain who is pending right reverse shoulder arthroplasty on date TBD by [facility] and presents today for telephonic preoperative cardiovascular risk assessment.  History of Present Illness    Alyssa [name] is a 68 y.o. female who presents via audio/video conferencing for a telehealth visit today.  Pt was last seen in cardiology clinic on 04/03/2024 by Dr. Alvan.  At that time Alyssa [name] was doing well.  The patient is now pending procedure as outlined above. Since her last visit, she denies chest pain, shortness of breath, lower extremity edema, fatigue, palpitations, melena, hematuria, hemoptysis, diaphoresis, weakness, presyncope, syncope, orthopnea, and PND.  Today patient is doing well overall.  She is without acute cardiovascular concerns or complaint.  She denies any chest pains or dyspnea on exertion.  Denies any further syncope.  She denies any further  polysubstance use.  She reports she stays relatively active.  She does all the house chores such as cleaning, doing dishes, and washing close.  She also goes to the grocery store weekly and pulls weeds without limitation.  Overall patient is easily able to complete greater than 4 METS without symptoms or limitations.  Past Medical History    Past Medical History:  Diagnosis Date   Bright's disease age 11   issue resolved   Chronic pain    COPD (chronic obstructive pulmonary disease) (HCC)    Headache    Marijuana abuse    Myocardial infarction (HCC) 05/25/2023   NSTEMI 05/25/23 in setting of recreational drug use wtih syncope; mild non-obstructive CAD 03/08/22   Nerve damage    to back and legs   Primary localized osteoarthrosis, lower leg    oa   Pulmonary nodules    Tobacco abuse    Wears dentures top   Wears glasses    Past Surgical History:  Procedure Laterality Date   ABDOMINAL HYSTERECTOMY  yrs ago   1 ovary left   ANTERIOR AND POSTERIOR REPAIR N/A 05/30/2021   Procedure: ANTERIOR (CYSTOCELE) AND POSTERIOR REPAIR (RECTOCELE);  Surgeon: Horacio Boas, MD;  Location: [facility];  Service: Gynecology;  Laterality: N/A;   ANTERIOR CERVICAL DECOMP/DISCECTOMY FUSION N/A 09/10/2022   Procedure: Anterior Cervical Decompression Fusion - Cervical five-Cervical six;  Surgeon: Louis Shove, MD;  Location: [facility] OR;  Service: Neurosurgery;  Laterality: N/A;   ANTERIOR LATERAL LUMBAR FUSION WITH PERCUTANEOUS SCREW 1 LEVEL Left 10/01/2023   Procedure: Extreme Lateral Interbody Fusion - left - Left two-Lumbar three - Interbody Fusion with  lateral plating;  Surgeon: Louis Shove, MD;  Location: [facility] OR;  Service: Neurosurgery;  Laterality: Left;   BACK SURGERY  yrs ago   x3 bolts  and screws and rods x 2 lower back   BREAST BIOPSY Left    COLPOCLEISIS N/A 03/22/2022   Procedure: EXAM UNDER ANESTHESIA, COLPOCLEISIS; PERINEORRHAPHY;  Surgeon: Marilynne Rosaline SAILOR, MD;  Location: [facility];  Service: Gynecology;  Laterality: N/A;   CYSTOSCOPY N/A 03/22/2022   Procedure: CYSTOSCOPY;  Surgeon: Marilynne Rosaline SAILOR, MD;  Location: [facility];  Service: Gynecology;  Laterality: N/A;   HEMORRHOID SURGERY  yrs ago   LEFT HEART CATH AND CORONARY ANGIOGRAPHY N/A 03/08/2022   Procedure: LEFT HEART CATH AND CORONARY ANGIOGRAPHY;  Surgeon: Verlin Lonni BIRCH, MD;  Location: [facility];  Service: Cardiovascular;  Laterality: N/A;   ROBOTIC ASSISTED LAPAROSCOPIC SACROCOLPOPEXY N/A 02/13/2022   Procedure: XI ROBOTIC ASSISTED LAPAROSCOPIC LYSIS OF ADHESIONS;  Surgeon: Marilynne Rosaline SAILOR, MD;  Location: [facility];  Service: Gynecology;  Laterality: N/A;   TONSILLECTOMY     VAGINAL PROLAPSE REPAIR N/A 10/19/2021   Procedure: VAGINAL VAULT SUSPENSION;  Surgeon: Horacio Boas, MD;  Location: [facility];  Service: Gynecology;  Laterality: N/A;   VULVAR LESION REMOVAL N/A 05/30/2021   Procedure: REMOVAL OF VULVAR CYST;  Surgeon: Horacio Boas, MD;  Location: [facility];  Service: Gynecology;  Laterality: N/A;    Allergies  Allergies  Allergen Reactions   Bee Venom Anaphylaxis   Arthrotec [Diclofenac-Misoprostol] Other (See Comments)    Unknown reaction   Chlorpheniramine Maleate Other (See Comments)   Codeine Itching and Other (See Comments)    Headache    Cymbalta [Duloxetine Hcl] Other (See Comments)    Altered mental state Confusion    Effexor [Venlafaxine Hydrochloride] Other (See Comments)    Confusion    Hydrocodone  Itching and Other (See Comments)    Severe headache   Lactate Other (See Comments)    GI upset   Lexapro [Escitalopram Oxalate] Other (See Comments)    Confusion    Neurontin [Gabapentin] Other (See Comments)    Unknown reaction   Opana [Oxymorphone Hcl] Other (See Comments)    Not effective   Provigil [Modafinil] Other (See Comments)    Unknown reaction    Pseudoephedrine Hcl Other (See Comments)    Makes me nervous   Talwin [Pentazocine] Other (See Comments)    Unknown reaction   Zoloft [Sertraline] Other (See Comments)    Increased depression    Home Medications    Prior to Admission medications   Medication Sig Start Date End Date Taking? Authorizing Provider  acetaminophen  (TYLENOL ) 500 MG tablet Take 1 tablet (500 mg total) by mouth every 6 (six) hours as needed (pain). Patient taking differently: Take 500 mg by mouth every 6 (six) hours as needed for headache (pain). 03/01/22   Marilynne Rosaline SAILOR, MD  albuterol  (VENTOLIN  HFA) [phone] Base) MCG/ACT inhaler INHALE 2 PUFFS BY MOUTH EVERY 6 HOURS AS NEEDED FOR WHEEZE OR SHORTNESS OF BREATH 02/06/24   Shelah Lamar RAMAN, MD  ALPRAZolam  (XANAX ) 1 MG tablet Take 1 mg by mouth 3 (three) times daily.    [provider]  aspirin  EC 81 MG tablet Take 81 mg by mouth at bedtime.    [provider]  Cholecalciferol (VITAMIN D -3 PO) Take 1 capsule by mouth every evening.    [provider]  loratadine  (EQ ALL DAY ALLERGY RELIEF)  10 MG tablet Take 10 mg by mouth daily.    [provider]  meloxicam (MOBIC) 15 MG tablet Take 15 mg by mouth at bedtime.    [provider]  methocarbamol  (ROBAXIN ) 750 MG tablet Take 1 tablet (750 mg total) by mouth 3 (three) times daily. 09/11/22   Louis Shove, MD  polyethylene glycol powder (GLYCOLAX /MIRALAX ) 17 GM/SCOOP powder Take 17 g by mouth daily. Drink 17g (1 scoop) dissolved in water  per day. 03/01/22   Marilynne Rosaline SAILOR, MD  pregabalin  (LYRICA ) 100 MG capsule Take 100 mg by mouth 3 (three) times daily.    [provider]  Tiotropium Bromide  Monohydrate (SPIRIVA  RESPIMAT) 2.5 MCG/ACT AERS Inhale 2 puffs into the lungs daily. Patient not taking: Reported on 06/23/2024 09/25/23   Shelah Lamar RAMAN, MD  Tiotropium Bromide  Monohydrate (SPIRIVA  RESPIMAT) 2.5 MCG/ACT AERS Inhale 2 puffs into the lungs daily. 06/12/24    Shelah Lamar RAMAN, MD  traMADol  (ULTRAM ) 50 MG tablet Take 2 tablets (100 mg total) by mouth 3 (three) times daily. 10/02/23   Louis Shove, MD    Physical Exam    Vital Signs:  Alyssa [name] [place] does not have vital signs available for review today.  Given telephonic nature of communication, physical exam is limited. AAOx3. NAD. Normal affect.  Speech and respirations are unlabored.  Accessory Clinical Findings    None  Assessment & Plan    1.  Preoperative Cardiovascular Risk Assessment: According to the Revised Cardiac Risk Index (RCRI), her Perioperative Risk of Major Cardiac Event is (%): 0.4. Her Functional Capacity in METs is: 5.62 according to the [facility] Activity Status Index (DASI). Therefore, based on ACC/AHA guidelines, patient would be at acceptable risk for the planned procedure without further cardiovascular testing. I will route this recommendation to the requesting party via Epic fax function.  The patient was advised that if she develops new symptoms prior to surgery to contact our office to arrange for a follow-up visit, and she verbalized understanding.  She may hold aspirin  for 5-7 days prior to procedure. Please resume aspirin  as soon as possible postprocedure, at the discretion of the surgeon.    A copy of this note will be routed to requesting surgeon.  Time:   Today, I have spent 6 minutes with the patient with telehealth technology discussing medical history, symptoms, and management plan.     Lum LITTIE Louis, NP  07/01/2024, 1:56 PM

## 2024-07-01 NOTE — Telephone Encounter (Signed)
 Fax received from Dr. Bonner Hair with [facility] to perform a right reverse total shoulder arthroplasty on patient with interscalene block.  Patient needs surgery clearance. Surgery is pending. Patient was seen on 07/17/23. Office protocol is a risk assessment can be sent to surgeon if patient has been seen in 60 days or less.   Pt is scheduled with Beth for risk assessment on 07/15/24 Will route to clearance pool until visit is complete

## 2024-07-08 ENCOUNTER — Other Ambulatory Visit: Payer: Self-pay | Admitting: Acute Care

## 2024-07-08 DIAGNOSIS — F17210 Nicotine dependence, cigarettes, uncomplicated: Secondary | ICD-10-CM

## 2024-07-08 DIAGNOSIS — Z87891 Personal history of nicotine dependence: Secondary | ICD-10-CM

## 2024-07-08 DIAGNOSIS — Z122 Encounter for screening for malignant neoplasm of respiratory organs: Secondary | ICD-10-CM

## 2024-07-15 ENCOUNTER — Ambulatory Visit: Admitting: Primary Care

## 2024-07-15 ENCOUNTER — Encounter: Payer: Self-pay | Admitting: Primary Care

## 2024-07-15 VITALS — BP 108/71 | HR 67 | Temp 97.4°F | Ht 65.0 in | Wt 154.8 lb

## 2024-07-15 DIAGNOSIS — Z01811 Encounter for preprocedural respiratory examination: Secondary | ICD-10-CM | POA: Diagnosis not present

## 2024-07-15 DIAGNOSIS — F17210 Nicotine dependence, cigarettes, uncomplicated: Secondary | ICD-10-CM | POA: Diagnosis not present

## 2024-07-15 DIAGNOSIS — R918 Other nonspecific abnormal finding of lung field: Secondary | ICD-10-CM

## 2024-07-15 DIAGNOSIS — J449 Chronic obstructive pulmonary disease, unspecified: Secondary | ICD-10-CM | POA: Diagnosis not present

## 2024-07-15 MED ORDER — SPIRIVA RESPIMAT 2.5 MCG/ACT IN AERS: 2.0000 | INHALATION_SPRAY | Freq: Every day | RESPIRATORY_TRACT | Status: DC

## 2024-07-15 NOTE — Patient Instructions (Signed)
  VISIT SUMMARY: Today, you were seen for a surgical risk assessment in preparation for your upcoming shoulder replacement surgery. We discussed your history of COPD, emphysema, smoking-related bronchitis, and a stable pulmonary nodule. You are currently undergoing pre-operative assessments and awaiting clearance from various specialists.  YOUR PLAN: -CHRONIC OBSTRUCTIVE PULMONARY DISEASE (COPD): COPD is a chronic lung disease that makes it hard to breathe. To optimize your lung function before surgery, you should restart using your Spiriva  inhaler. We provided you with a sample and will help you apply for patient assistance if needed. After surgery, early walking, using an incentive spirometer, and performing ankle pumps or wearing compression stockings are recommended to prevent complications.  -EMPHYSEMA: Emphysema is a type of COPD that damages the air sacs in your lungs. It is part of your overall COPD management plan.  -SMOKING-RELATED BRONCHITIS: This condition is caused by smoking and results in a chronic cough with mucus. We encourage you to quit smoking and recommend the book by Dale Myron for help. You might also consider using nicotine  patches or gum.  -PULMONARY NODULE: A pulmonary nodule is a small growth in the lung. Your nodule is stable and has not changed in size. We will schedule a follow-up CT scan in one year to monitor it.  INSTRUCTIONS: Please restart using your Spiriva  inhaler as directed. After your shoulder surgery, make sure to walk early, use an incentive spirometer, and perform ankle pumps or wear compression stockings to prevent complications. We will schedule a follow-up CT scan for your pulmonary nodule in one year. Consider reading the book by Dale Myron and using nicotine  patches or gum to help quit smoking.  Follow-up 6 months with Dr. Byrum
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
## 2024-07-20 NOTE — Telephone Encounter (Signed)
 07/15/24 risk assessment from [facility] faxed to [facility].

## 2024-09-03 NOTE — Progress Notes (Signed)
 Sent message, via epic in basket, requesting orders in epic from [facility].

## 2024-09-09 NOTE — Progress Notes (Addendum)
 COVID Vaccine received:  []  No []  Yes Date of any COVID positive Test in last 90 days:  PCP - Dr. Charlie Love Cardiologist - Dr. Ronal Ross  Chest x-ray - Chest CT 06/16/24 Epic EKG - 09/25/23 Epic  Stress Test -  ECHO - 05/26/23 Epic Cardiac Cath - 03/08/22 Epic Cardiac clearance- Bowel Prep - []  No  []   Yes ______  Cardiac clearance 7/2/25GLENWOOD Lum Louis NP  Pacemaker / ICD device []  No []  Yes   Spinal Cord Stimulator:[]  No []  Yes       History of Sleep Apnea? []  No []  Yes   CPAP used?- []  No []  Yes    Does the patient monitor blood sugar?          []  No []  Yes  []  N/A  Patient has: []  NO Hx DM   []  Pre-DM                 []  DM1  []   DM2 Does patient have a [employer] or Dexacom? []  No []  Yes   Fasting Blood Sugar Ranges-  Checks Blood Sugar _____ times a day  GLP1 agonist / usual dose -  GLP1 instructions:  SGLT-2 inhibitors / usual dose -  SGLT-2 instructions:   Blood Thinner / Instructions: Aspirin  Instructions:  Comments:   Activity level: Patient is able / unable to climb a flight of stairs without difficulty; []  No CP  []  No SOB, but would have ___   Patient can / can not perform ADLs without assistance.   Anesthesia review:   Patient denies shortness of breath, fever, cough and chest pain at PAT appointment.  Patient verbalized understanding and agreement to the Pre-Surgical Instructions that were given to them at this PAT appointment. Patient was also educated of the need to review these PAT instructions again prior to his/her surgery.I reviewed the appropriate phone numbers to call if they have any and questions or concerns.

## 2024-09-09 NOTE — Patient Instructions (Signed)
 SURGICAL WAITING ROOM VISITATION  Patients having surgery or a procedure may have no more than 2 support people in the waiting area - these visitors may rotate.    Children under the age of 36 must have an adult with them who is not the patient.  Visitors with respiratory illnesses are discouraged from visiting and should remain at home.  If the patient needs to stay at the hospital during part of their recovery, the visitor guidelines for inpatient rooms apply. Pre-op nurse will coordinate an appropriate time for 1 support person to accompany patient in pre-op.  This support person may not rotate.    Please refer to the [facility] website for the visitor guidelines for Inpatients (after your surgery is over and you are in a regular room).       Your procedure is scheduled on: 09/23/24   Report to [facility] Main Entrance    Report to admitting at 5:15 AM   Call this number if you have problems the morning of surgery [phone]   Do not eat food or drink liquids:After Midnight.but may have sips of water  to take meds.     Oral Hygiene is also important to reduce your risk of infection.                                    Remember - BRUSH YOUR TEETH THE MORNING OF SURGERY WITH YOUR REGULAR TOOTHPASTE  DENTURES WILL BE REMOVED PRIOR TO SURGERY PLEASE DO NOT APPLY Poly grip OR ADHESIVES!!!   Do NOT smoke after Midnight   Stop all vitamins and herbal supplements 7 days before surgery.   Take these medicines the morning of surgery with A SIP OF WATER : Xanax  if needed, loratadine , pregabalin (lyrica ), tylenol  if needed, inhaler.             You may not have any metal on your body including hair pins, jewelry, and body piercing             Do not wear make-up, lotions, powders, perfumes/cologne, or deodorant  Do not wear nail polish including gel and S&S, artificial/acrylic nails, or any other type of covering on natural nails including finger and toenails. If you have  artificial nails, gel coating, etc. that needs to be removed by a nail salon please have this removed prior to surgery or surgery may need to be canceled/ delayed if the surgeon/ anesthesia feels like they are unable to be safely monitored.   Do not shave  48 hours prior to surgery.    Do not bring valuables to the hospital. [place] IS NOT             RESPONSIBLE   FOR VALUABLES.   Contacts, glasses, dentures or bridgework may not be worn into surgery.  DO NOT BRING YOUR HOME MEDICATIONS TO THE HOSPITAL. PHARMACY WILL DISPENSE MEDICATIONS LISTED ON YOUR MEDICATION LIST TO YOU DURING YOUR ADMISSION IN THE HOSPITAL!    Patients discharged on the day of surgery will not be allowed to drive home.  Someone NEEDS to stay with you for the first 24 hours after anesthesia.   Special Instructions: Bring a copy of your healthcare power of attorney and living will documents the day of surgery if you haven't scanned them before.              Please read over the following fact sheets you were given:  IF YOU HAVE QUESTIONS ABOUT YOUR PRE-OP INSTRUCTIONS PLEASE CALL [phone] Alyssa [name]   If you received a COVID test during your pre-op visit  it is requested that you wear a mask when out in public, stay away from anyone that may not be feeling well and notify your surgeon if you develop symptoms. If you test positive for Covid or have been in contact with anyone that has tested positive in the last 10 days please notify you surgeon.      Pre-operative 5 CHG Bath Instructions   You can play a key role in reducing the risk of infection after surgery. Your skin needs to be as free of germs as possible. You can reduce the number of germs on your skin by washing with CHG (chlorhexidine  gluconate) soap before surgery. CHG is an antiseptic soap that kills germs and continues to kill germs even after washing.   DO NOT use if you have an allergy to chlorhexidine /CHG or antibacterial soaps. If your skin becomes  reddened or irritated, stop using the CHG and notify one of our RNs at [phone].   Please shower with the CHG soap starting 4 days before surgery using the following schedule:     Please keep in mind the following:  DO NOT shave, including legs and underarms, starting the day of your first shower.   You may shave your face at any point before/day of surgery.  Place clean sheets on your bed the day you start using CHG soap. Use a clean washcloth (not used since being washed) for each shower. DO NOT sleep with pets once you start using the CHG.   CHG Shower Instructions:  If you choose to wash your hair and private area, wash first with your normal shampoo/soap.  After you use shampoo/soap, rinse your hair and body thoroughly to remove shampoo/soap residue.  Turn the water  OFF and apply about 3 tablespoons (45 ml) of CHG soap to a CLEAN washcloth.  Apply CHG soap ONLY FROM YOUR NECK DOWN TO YOUR TOES (washing for 3-5 minutes)  DO NOT use CHG soap on face, private areas, open wounds, or sores.  Pay special attention to the area where your surgery is being performed.  If you are having back surgery, having someone wash your back for you may be helpful. Wait 2 minutes after CHG soap is applied, then you may rinse off the CHG soap.  Pat dry with a clean towel  Put on clean clothes/pajamas   If you choose to wear lotion, please use ONLY the CHG-compatible lotions on the back of this paper.     Additional instructions for the day of surgery: DO NOT APPLY any lotions, deodorants, cologne, or perfumes.   Put on clean/comfortable clothes.  Brush your teeth.  Ask your nurse before applying any prescription medications to the skin.      CHG Compatible Lotions   Aveeno Moisturizing lotion  Cetaphil Moisturizing Cream  Cetaphil Moisturizing Lotion  Clairol Herbal Essence Moisturizing Lotion, Dry Skin  Clairol Herbal Essence Moisturizing Lotion, Extra Dry Skin  Clairol Herbal Essence  Moisturizing Lotion, Normal Skin  Curel Age Defying Therapeutic Moisturizing Lotion with Alpha Hydroxy  Curel Extreme Care Body Lotion  Curel Soothing Hands Moisturizing Hand Lotion  Curel Therapeutic Moisturizing Cream, Fragrance-Free  Curel Therapeutic Moisturizing Lotion, Fragrance-Free  Curel Therapeutic Moisturizing Lotion, Original Formula  Eucerin Daily Replenishing Lotion  Eucerin Dry Skin Therapy Plus Alpha Hydroxy Crme  Eucerin Dry Skin Therapy Plus Alpha Hydroxy Lotion  Eucerin  Original Crme  Eucerin Original Lotion  Eucerin Plus Crme Eucerin Plus Lotion  Eucerin TriLipid Replenishing Lotion  Keri Anti-Bacterial Hand Lotion  Keri Deep Conditioning Original Lotion Dry Skin Formula Softly Scented  Keri Deep Conditioning Original Lotion, Fragrance Free Sensitive Skin Formula  Keri Lotion Fast Absorbing Fragrance Free Sensitive Skin Formula  Keri Lotion Fast Absorbing Softly Scented Dry Skin Formula  Keri Original Lotion  Keri Skin Renewal Lotion Keri Silky Smooth Lotion  Keri Silky Smooth Sensitive Skin Lotion  Nivea Body Creamy Conditioning Oil  Nivea Body Extra Enriched Lotion  Nivea Body Original Lotion  Nivea Body Sheer Moisturizing Lotion Nivea Crme  Nivea Skin Firming Lotion  NutraDerm 30 Skin Lotion  NutraDerm Skin Lotion  NutraDerm Therapeutic Skin Cream  NutraDerm Therapeutic Skin Lotion  ProShield Protective Hand Cream   Incentive Spirometer  An incentive spirometer is a tool that can help keep your lungs clear and active. This tool measures how well you are filling your lungs with each breath. Taking long deep breaths may help reverse or decrease the chance of developing breathing (pulmonary) problems (especially infection) following: A long period of time when you are unable to move or be active. BEFORE THE PROCEDURE  If the spirometer includes an indicator to show your best effort, your nurse or respiratory therapist will set it to a desired goal. If  possible, sit up straight or lean slightly forward. Try not to slouch. Hold the incentive spirometer in an upright position. INSTRUCTIONS FOR USE  Sit on the edge of your bed if possible, or sit up as far as you can in bed or on a chair. Hold the incentive spirometer in an upright position. Breathe out normally. Place the mouthpiece in your mouth and seal your lips tightly around it. Breathe in slowly and as deeply as possible, raising the piston or the ball toward the top of the column. Hold your breath for 3-5 seconds or for as long as possible. Allow the piston or ball to fall to the bottom of the column. Remove the mouthpiece from your mouth and breathe out normally. Rest for a few seconds and repeat Steps 1 through 7 at least 10 times every 1-2 hours when you are awake. Take your time and take a few normal breaths between deep breaths. The spirometer may include an indicator to show your best effort. Use the indicator as a goal to work toward during each repetition. After each set of 10 deep breaths, practice coughing to be sure your lungs are clear. If you have an incision (the cut made at the time of surgery), support your incision when coughing by placing a pillow or rolled up towels firmly against it. Once you are able to get out of bed, walk around indoors and cough well. You may stop using the incentive spirometer when instructed by your caregiver.  RISKS AND COMPLICATIONS Take your time so you do not get dizzy or light-headed. If you are in pain, you may need to take or ask for pain medication before doing incentive spirometry. It is harder to take a deep breath if you are having pain. AFTER USE Rest and breathe slowly and easily. It can be helpful to keep track of a log of your progress. Your caregiver can provide you with a simple table to help with this. If you are using the spirometer at home, follow these instructions: SEEK MEDICAL CARE IF:  You are having difficultly using the  spirometer. You have trouble using the spirometer  as often as instructed. Your pain medication is not giving enough relief while using the spirometer. You develop fever of 100.5 F (38.1 C) or higher. SEEK IMMEDIATE MEDICAL CARE IF:  You cough up bloody sputum that had not been present before. You develop fever of 102 F (38.9 C) or greater. You develop worsening pain at or near the incision site. MAKE SURE YOU:  Understand these instructions. Will watch your condition. Will get help right away if you are not doing well or get worse. Document Released: 04/29/2007 Document Revised: 03/10/2012 Document Reviewed: 06/30/2007 [facility] Patient Information 2014 [place], [place]- Preparing for Total Shoulder Arthroplasty    Before surgery, you can play an important role. Because skin is not sterile, your skin needs to be as free of germs as possible. You can reduce the number of germs on your skin by using the following products. Benzoyl Peroxide Gel Reduces the number of germs present on the skin Applied twice a day to shoulder area starting two days before surgery    ==================================================================  Please follow these instructions carefully:  BENZOYL PEROXIDE 5% GEL  Please do not use if you have an allergy to benzoyl peroxide.   If your skin becomes reddened/irritated stop using the benzoyl peroxide.  Starting two days before surgery, apply as follows: Apply benzoyl peroxide in the morning and at night. Apply after taking a shower. If you are not taking a shower clean entire shoulder front, back, and side along with the armpit with a clean wet washcloth.  Place a quarter-sized dollop on your shoulder and rub in thoroughly, making sure to cover the front, back, and side of your shoulder, along with the armpit.   2 days before ____ AM   ____ PM              1 day before ____ AM   ____ PM                         Do this twice a day for two  days.  (Last application is the night before surgery, AFTER using the CHG soap as described below).  Do NOT apply benzoyl peroxide gel on the day of surgery.

## 2024-09-10 ENCOUNTER — Encounter (HOSPITAL_COMMUNITY): Payer: Self-pay

## 2024-09-10 ENCOUNTER — Other Ambulatory Visit: Payer: Self-pay

## 2024-09-10 ENCOUNTER — Encounter (HOSPITAL_COMMUNITY)
Admission: RE | Admit: 2024-09-10 | Discharge: 2024-09-10 | Disposition: A | Discharge status: Home / Self Care | Source: Ambulatory Visit | Attending: Orthopaedic Surgery | Admitting: Orthopaedic Surgery

## 2024-09-10 VITALS — BP 151/89 | HR 76 | Temp 98.3°F | Resp 18 | Ht 65.0 in | Wt 154.0 lb

## 2024-09-10 DIAGNOSIS — Z01812 Encounter for preprocedural laboratory examination: Secondary | ICD-10-CM | POA: Diagnosis present

## 2024-09-10 DIAGNOSIS — F17210 Nicotine dependence, cigarettes, uncomplicated: Secondary | ICD-10-CM | POA: Diagnosis not present

## 2024-09-10 DIAGNOSIS — Z01818 Encounter for other preprocedural examination: Secondary | ICD-10-CM

## 2024-09-10 DIAGNOSIS — J449 Chronic obstructive pulmonary disease, unspecified: Secondary | ICD-10-CM | POA: Insufficient documentation

## 2024-09-10 DIAGNOSIS — I2510 Atherosclerotic heart disease of native coronary artery without angina pectoris: Secondary | ICD-10-CM | POA: Insufficient documentation

## 2024-09-10 DIAGNOSIS — M19011 Primary osteoarthritis, right shoulder: Secondary | ICD-10-CM | POA: Diagnosis not present

## 2024-09-10 HISTORY — DX: Depression, unspecified: F32.A

## 2024-09-10 HISTORY — DX: Unspecified osteoarthritis, unspecified site: M19.90

## 2024-09-10 LAB — BASIC METABOLIC PANEL WITH GFR
Anion gap: 12 (ref 5–15)
BUN: 9 mg/dL (ref 8–23)
CO2: 26 mmol/L (ref 22–32)
Calcium: 9.4 mg/dL (ref 8.9–10.3)
Chloride: 102 mmol/L (ref 98–111)
Creatinine, Ser: 0.65 mg/dL (ref 0.44–1.00)
GFR, Estimated: >60 mL/min (ref >60)
Glucose, Bld: 97 mg/dL (ref 70–99)
Potassium: 3.9 mmol/L (ref 3.5–5.1)
Sodium: 140 mmol/L (ref 135–145)

## 2024-09-10 LAB — CBC
HCT: 43 % (ref 36.0–46.0)
Hemoglobin: 13.3 g/dL (ref 12.0–15.0)
MCH: 28.4 pg (ref 26.0–34.0)
MCHC: 30.9 g/dL (ref 30.0–36.0)
MCV: 91.9 fL (ref 80.0–100.0)
Platelets: 199 K/uL (ref 150–400)
RBC: 4.68 MIL/uL (ref 3.87–5.11)
RDW: 12.3 % (ref 11.5–15.5)
WBC: 6.6 K/uL (ref 4.0–10.5)
nRBC: 0 % (ref 0.0–0.2)

## 2024-09-10 LAB — SURGICAL PCR SCREEN
MRSA, PCR: NEGATIVE
Staphylococcus aureus: NEGATIVE

## 2024-09-10 NOTE — Progress Notes (Signed)
 COVID Vaccine received:  []  No [x]  Yes Date of any COVID positive Test in last 90 days: no PCP - Dr. Charlie Love Cardiologist - Dr. Ronal Ross  Chest x-ray - Chest CT 06/16/24 Epic EKG - 09/25/23 Epic  Stress Test -  ECHO - 05/26/23 Epic Cardiac Cath - 03/08/22 Epic Cardiac clearance- Bowel Prep - [x]  No  []   Yes ______  Cardiac clearance 7/2/25[facility] Lum Louis NP  Pacemaker / ICD device [x]  No []  Yes   Spinal Cord Stimulator:[x]  No []  Yes       History of Sleep Apnea? [x]  No []  Yes   CPAP used?- [x]  No []  Yes    Does the patient monitor blood sugar?          [x]  No []  Yes  []  N/A  Patient has: [x]  NO Hx DM   []  Pre-DM                 []  DM1  []   DM2 Does patient have a [employer] or [employer]? []  No []  Yes   Fasting Blood Sugar Ranges- 0 Checks Blood Sugar ____0_ times a day  GLP1 agonist / usual dose - no GLP1 instructions:  SGLT-2 inhibitors / usual dose - no SGLT-2 instructions:   Blood Thinner / Instructions:no Aspirin  Instructions:ASA 81 mg Last dose to be 09/16/24  Comments:   Activity level: Patient is  unable to climb a flight of stairs without difficulty; [x]  No CP  [x]  No SOB, _   Patient can perform ADLs without assistance.   Anesthesia review: CAD-MI, COPD,Smoker  Patient denies shortness of breath, fever, cough and chest pain at PAT appointment.  Patient verbalized understanding and agreement to the Pre-Surgical Instructions that were given to them at this PAT appointment. Patient was also educated of the need to review these PAT instructions again prior to his/her surgery.I reviewed the appropriate phone numbers to call if they have any and questions or concerns.

## 2024-09-11 NOTE — Progress Notes (Signed)
 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 smoker with h/o COPD, nonobstructive CAD, right shoulder OA scheduled for above procedure 09/23/24 with Dr. Bonner Cristy.   Pt last seen by pulmonology 07/15/2024. Pt with chronic cough. No recent respiratory infections or O2 use. Per note, She is optimized for surgery from pulmonary standpoint, ultimate clearance decided upon by surgeon.  - Restart Spiriva  inhaler to optimize lung function before surgery - Provide sample of Spiriva  inhaler - Recommend early ambulation post-surgery to prevent blood clots and respiratory failure - Use incentive spirometer post-surgery to encourage deep breathing - Perform ankle pumps or wear compression stockings post-surgery to prevent blood clots  Per cardiology preoperative evaluation 07/01/24, According to the Revised Cardiac Risk Index (RCRI), her Perioperative Risk of Major Cardiac Event is (%): 0.4. Her Functional Capacity in METs is: 5.62 according to the Duke Activity Status Index (DASI). Therefore, based on ACC/AHA guidelines, patient would be at acceptable risk for the planned procedure without further cardiovascular testing. I will route this recommendation to the requesting party via Epic fax function.   The patient was advised that if she develops new symptoms prior to surgery to contact our office to arrange for a follow-up visit, and she verbalized understanding.   She may hold aspirin  for 5-7 days prior to procedure. Please resume aspirin  as soon as possible postprocedure, at the discretion of the surgeon.   VS: BP (!) 151/89   Pulse 76   Temp 36.8 C (Oral)   Resp 18   Ht 5'  5 (1.651 m)   Wt 69.9 kg   SpO2 100%   BMI 25.63 kg/m   PROVIDERS: Shepard Ade, MD is PCP    LABS: Labs reviewed: Acceptable for surgery. (all labs ordered are listed, but only abnormal results are displayed)  Labs Reviewed  SURGICAL PCR SCREEN  BASIC METABOLIC PANEL WITH GFR  CBC     IMAGES:   EKG:   CV: Echo 05/26/2023 1. Distal septal and apical hypokinesis . Left ventricular ejection  fraction, by estimation, is 50 to 55%. The left ventricle has low normal  function. The left ventricle demonstrates regional wall motion  abnormalities (see scoring diagram/findings for  description). The left ventricular internal cavity size was mildly  dilated. Left ventricular diastolic parameters were normal.   2. Right ventricular systolic function is normal. The right ventricular  size is normal.   3. The mitral valve is normal in structure. No evidence of mitral valve  regurgitation. No evidence of mitral stenosis.   4. The aortic valve is tricuspid. Aortic valve regurgitation is not  visualized. No aortic stenosis is present.   5. The inferior vena cava is normal in size with greater than 50%  respiratory variability, suggesting right atrial pressure of 3 mmHg.   Cardiac Cath 03/08/2022    1st Diag lesion is 30% stenosed.   Prox LAD to Mid LAD lesion is 20% stenosed.   The left ventricular systolic function is normal.   LV end diastolic pressure is normal.   The left ventricular ejection fraction is greater than 65% by visual estimate.   There is no mitral valve regurgitation.   Mild  non-obstructive disease in the Diagonal/mid LAD Moderate caliber Circumflex with no obstructive disease Large dominant RCA with no obstructive disease Hyperdynamic LV systolic function No evidence of dilated aortic root or ascending aorta dissection   Recommendations: Will monitor overnight. Consider other causes of neck pain, chest burning and abnormal EKG.  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   Chronic pain    COPD (chronic obstructive pulmonary disease) (HCC)    Depression    Headache    Marijuana abuse    Myocardial infarction (HCC) 05/25/2023   NSTEMI 05/25/23 in setting of recreational drug use wtih syncope; mild non-obstructive CAD 03/08/22   Nerve damage    to back and legs   Primary localized osteoarthrosis, lower leg    oa   Pulmonary nodules    Tobacco abuse    Wears dentures top   Wears glasses     Past Surgical History:  Procedure Laterality Date   ABDOMINAL HYSTERECTOMY  yrs ago   1 ovary left   ANTERIOR AND POSTERIOR REPAIR N/A 05/30/2021   Procedure: ANTERIOR (CYSTOCELE) AND POSTERIOR REPAIR (RECTOCELE);  Surgeon: Horacio Boas, MD;  Location: [facility];  Service: Gynecology;  Laterality: N/A;   ANTERIOR CERVICAL DECOMP/DISCECTOMY FUSION N/A 09/10/2022   Procedure: Anterior Cervical Decompression Fusion - Cervical five-Cervical six;  Surgeon: Louis Shove, MD;  Location: [facility] OR;  Service: Neurosurgery;  Laterality: N/A;   ANTERIOR LATERAL LUMBAR FUSION WITH PERCUTANEOUS SCREW 1 LEVEL Left 10/01/2023   Procedure: Extreme Lateral Interbody Fusion - left - Left two-Lumbar three - Interbody Fusion with lateral plating;  Surgeon: Louis Shove, MD;  Location: [facility] OR;  Service: Neurosurgery;  Laterality: Left;   BACK SURGERY  yrs ago   x3 bolts  and screws and rods x 2 lower back   BREAST BIOPSY Left    COLPOCLEISIS N/A 03/22/2022   Procedure: EXAM UNDER ANESTHESIA, COLPOCLEISIS; PERINEORRHAPHY;  Surgeon: Marilynne Rosaline SAILOR, MD;  Location: [facility];  Service: Gynecology;  Laterality: N/A;   CYSTOSCOPY N/A 03/22/2022   Procedure: CYSTOSCOPY;  Surgeon: Marilynne Rosaline SAILOR, MD;  Location: [facility];  Service: Gynecology;  Laterality: N/A;   HEMORRHOID SURGERY  yrs ago   LEFT HEART CATH AND CORONARY ANGIOGRAPHY N/A 03/08/2022   Procedure:  LEFT HEART CATH AND CORONARY ANGIOGRAPHY;  Surgeon: Verlin Lonni BIRCH, MD;  Location: [facility];  Service: Cardiovascular;  Laterality: N/A;   ROBOTIC ASSISTED LAPAROSCOPIC SACROCOLPOPEXY N/A 02/13/2022   Procedure: XI ROBOTIC ASSISTED LAPAROSCOPIC LYSIS OF ADHESIONS;  Surgeon: Marilynne Rosaline SAILOR, MD;  Location: [facility];  Service: Gynecology;  Laterality: N/A;   TONSILLECTOMY     VAGINAL PROLAPSE REPAIR N/A 10/19/2021   Procedure: VAGINAL VAULT SUSPENSION;  Surgeon: Horacio Boas, MD;  Location: [facility];  Service: Gynecology;  Laterality: N/A;   VULVAR LESION REMOVAL N/A 05/30/2021   Procedure: REMOVAL OF VULVAR CYST;  Surgeon: Horacio Boas, MD;  Location: [facility];  Service: Gynecology;  Laterality: N/A;    MEDICATIONS:  acetaminophen  (TYLENOL ) 500 MG tablet   albuterol  (VENTOLIN  HFA) 108 (90 Base) MCG/ACT inhaler   ALPRAZolam  (XANAX ) 1 MG tablet   aspirin  EC 81 MG tablet   Cholecalciferol (VITAMIN D -3 PO)   meloxicam (MOBIC) 15 MG tablet   methocarbamol  (ROBAXIN ) 750 MG tablet   oxybutynin (DITROPAN) 5 MG tablet   polyethylene glycol powder (GLYCOLAX /MIRALAX ) 17  GM/SCOOP powder   pregabalin  (LYRICA ) 100 MG capsule   rosuvastatin (CRESTOR) 20 MG tablet   Tiotropium Bromide  Monohydrate (SPIRIVA  RESPIMAT) 2.5 MCG/ACT AERS   traMADol  (ULTRAM ) 50 MG tablet    0.9 %  sodium chloride  infusion     Harlene Hoots Ward, PA-C [facility] Pre-Surgical Testing [phone]

## 2024-09-11 NOTE — Anesthesia Preprocedure Evaluation (Addendum)
 Anesthesia Evaluation  Patient identified by MRN, date of birth, ID band Patient awake    Reviewed: Allergy & Precautions, NPO status , Patient's Chart, lab work & pertinent test results  Airway Mallampati: II  TM Distance: >3 FB Neck ROM: Full    Dental  (+) Dental Advisory Given, Upper Dentures   Pulmonary COPD,  COPD inhaler, Current SmokerPatient did not abstain from smoking.   Pulmonary exam normal breath sounds clear to auscultation       Cardiovascular (-) angina + CAD and + Past MI  Normal cardiovascular exam Rhythm:Regular Rate:Normal     Neuro/Psych  Headaches PSYCHIATRIC DISORDERS Anxiety Depression       GI/Hepatic negative GI ROS,,,(+)     substance abuse  marijuana use  Endo/Other  negative endocrine ROS    Renal/GU Renal disease     Musculoskeletal  (+) Arthritis ,    Abdominal   Peds  Hematology negative hematology ROS (+)   Anesthesia Other Findings Day of surgery medications reviewed with the patient.  Reproductive/Obstetrics                              Anesthesia Physical Anesthesia Plan  ASA: 3  Anesthesia Plan: General   Post-op Pain Management: Regional block* and Tylenol  PO (pre-op)*   Induction: Intravenous  PONV Risk Score and Plan: 2 and Midazolam , Dexamethasone  and Ondansetron   Airway Management Planned: Oral ETT  Additional Equipment:   Intra-op Plan:   Post-operative Plan: Extubation in OR  Informed Consent: I have reviewed the patients History and Physical, chart, labs and discussed the procedure including the risks, benefits and alternatives for the proposed anesthesia with the patient or authorized representative who has indicated his/her understanding and acceptance.     Dental advisory given  Plan Discussed with: CRNA  Anesthesia Plan Comments: (See PAT note 09/10/24)         Anesthesia Quick Evaluation

## 2024-09-21 NOTE — H&P (Signed)
 PREOPERATIVE H&P  Chief Complaint: osteoarthritis of right shoulder  HPI: Alyssa [name] is a 68 y.o. female who is scheduled for Procedure(s): ARTHROPLASTY, SHOULDER, TOTAL, REVERSE.   Patient has a past medical history significant for COPD, NSTEMI 2024.   Patient has had shoulder pain for quite some time. She had an injection and got temporary relief. She tried home exercises with no improvement.   Symptoms are rated as moderate to severe, and have been worsening.  This is significantly impairing activities of daily living.    Please see clinic note for further details on this patient's care.    She has elected for surgical management.   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   Nerve damage    to back and legs   Primary localized osteoarthrosis, lower leg    oa   Pulmonary nodules    Tobacco abuse    Wears dentures top   Wears glasses    Past Surgical History:  Procedure Laterality Date   ABDOMINAL HYSTERECTOMY  yrs ago   1 ovary left   ANTERIOR AND POSTERIOR REPAIR N/A 05/30/2021   Procedure: ANTERIOR (CYSTOCELE) AND POSTERIOR REPAIR (RECTOCELE);  Surgeon: Horacio Boas, MD;  Location: [facility];  Service: Gynecology;  Laterality: N/A;   ANTERIOR CERVICAL DECOMP/DISCECTOMY FUSION N/A 09/10/2022   Procedure: Anterior Cervical Decompression Fusion - Cervical five-Cervical six;  Surgeon: Louis Shove, MD;  Location: [facility] OR;  Service: Neurosurgery;  Laterality: N/A;   ANTERIOR LATERAL LUMBAR FUSION WITH PERCUTANEOUS SCREW 1 LEVEL Left 10/01/2023   Procedure: Extreme Lateral Interbody Fusion - left - Left two-Lumbar three - Interbody Fusion with  lateral plating;  Surgeon: Louis Shove, MD;  Location: [facility] OR;  Service: Neurosurgery;  Laterality: Left;   BACK SURGERY  yrs ago   x3 bolts  and screws and rods x 2 lower back   BREAST BIOPSY Left    COLPOCLEISIS N/A 03/22/2022   Procedure: EXAM UNDER ANESTHESIA, COLPOCLEISIS; PERINEORRHAPHY;  Surgeon: Marilynne Rosaline LOISE, MD;  Location: [facility];  Service: Gynecology;  Laterality: N/A;   CYSTOSCOPY N/A 03/22/2022   Procedure: CYSTOSCOPY;  Surgeon: Marilynne Rosaline LOISE, MD;  Location: [facility];  Service: Gynecology;  Laterality: N/A;   HEMORRHOID SURGERY  yrs ago   LEFT HEART CATH AND CORONARY ANGIOGRAPHY N/A 03/08/2022   Procedure: LEFT HEART CATH AND CORONARY ANGIOGRAPHY;  Surgeon: Verlin Lonni BIRCH, MD;  Location: [facility];  Service: Cardiovascular;  Laterality: N/A;   ROBOTIC ASSISTED LAPAROSCOPIC SACROCOLPOPEXY N/A 02/13/2022   Procedure: XI ROBOTIC ASSISTED LAPAROSCOPIC LYSIS OF ADHESIONS;  Surgeon: Marilynne Rosaline LOISE, MD;  Location: [facility];  Service: Gynecology;  Laterality: N/A;   TONSILLECTOMY     VAGINAL PROLAPSE REPAIR N/A 10/19/2021   Procedure: VAGINAL VAULT SUSPENSION;  Surgeon: Horacio Boas, MD;  Location: [facility];  Service: Gynecology;  Laterality: N/A;   VULVAR LESION REMOVAL N/A 05/30/2021   Procedure: REMOVAL OF VULVAR CYST;  Surgeon: Horacio Boas, MD;  Location: [facility];  Service: Gynecology;  Laterality: N/A;   Social History  Socioeconomic History   Marital status: Married    Spouse name: Not on file   Number of children: Not on file   Years of education: Not on file   Highest education level: Not on file  Occupational History   Not on file  Tobacco Use   Smoking status: Every Day    Current packs/day: 0.50    Average packs/day: 0.5 packs/day for 40.0 years (20.0 ttl pk-yrs)    Types: Cigarettes   Smokeless tobacco: Never   Tobacco  comments:    Pt smokes 6-7 cigarettes every day. AB, CMA 07-15-24        Pt smokes 4 cigarettes a day ARJ 07/17/23  Vaping Use   Vaping status: Never Used  Substance and Sexual Activity   Alcohol use: No   Drug use: No   Sexual activity: Not Currently  Other Topics Concern   Not on file  Social History Narrative   Not on file   Social Drivers of Health   Financial Resource Strain: Not on file  Food Insecurity: No Food Insecurity (05/26/2023)   Hunger Vital Sign    Worried About Running Out of Food in the Last Year: Never true    Ran Out of Food in the Last Year: Never true  Transportation Needs: No Transportation Needs (05/26/2023)   PRAPARE - Administrator, Civil Service (Medical): No    Lack of Transportation (Non-Medical): No  Physical Activity: Not on file  Stress: Not on file  Social Connections: Not on file   Family History  Problem Relation Age of Onset   Stroke Mother    Hypertension Father    Breast cancer Paternal Aunt 33 - 23   Colon cancer Neg Hx    Stomach cancer Neg Hx    Rectal cancer Neg Hx    Esophageal cancer Neg Hx    Allergies  Allergen Reactions   Bee Venom Anaphylaxis   Arthrotec [Diclofenac-Misoprostol] Other (See Comments)    Unknown reaction   Chlorpheniramine Maleate Other (See Comments)   Codeine Itching and Other (See Comments)    Headache    Cymbalta [Duloxetine Hcl] Other (See Comments)    Altered mental state Confusion    Effexor [Venlafaxine Hydrochloride] Other (See Comments)    Confusion    Hydrocodone  Itching and Other (See Comments)    Severe headache   Lactate Other (See Comments)    GI upset   Lexapro [Escitalopram Oxalate] Other (See Comments)    Confusion    Neurontin [Gabapentin] Other (See Comments)    Unknown reaction   Opana [Oxymorphone Hcl] Other (See Comments)    Not effective   Provigil [Modafinil] Other (See Comments)    Unknown reaction   Pseudoephedrine Hcl Other (See Comments)    Makes me  nervous   Talwin [Pentazocine] Other (See Comments)    Unknown reaction   Zoloft [Sertraline] Other (See Comments)    Increased depression   Prior to Admission medications   Medication Sig Start Date End Date Taking? Authorizing Provider  acetaminophen  (TYLENOL ) 500 MG tablet Take 1 tablet (500 mg total) by mouth every 6 (six) hours as needed (pain). 03/01/22  Yes Marilynne Rosaline SAILOR, MD  albuterol  (VENTOLIN  HFA) 108 (90 Base) MCG/ACT inhaler INHALE 2 PUFFS BY MOUTH EVERY 6 HOURS AS NEEDED FOR WHEEZE OR SHORTNESS OF BREATH 02/06/24  Yes Shelah Lamar RAMAN, MD  ALPRAZolam  (XANAX ) 1 MG tablet Take 1 mg by mouth 3 (three) times daily.   Yes  [provider]  aspirin  EC 81 MG tablet Take 81 mg by mouth at bedtime.   Yes [provider]  Cholecalciferol (VITAMIN D -3 PO) Take 1 capsule by mouth every evening.   Yes [provider]  meloxicam  (MOBIC ) 15 MG tablet Take 15 mg by mouth at bedtime.   Yes [provider]  methocarbamol  (ROBAXIN ) 750 MG tablet Take 1 tablet (750 mg total) by mouth 3 (three) times daily. 09/11/22  Yes Pool, Victory, MD  oxybutynin (DITROPAN) 5 MG tablet Take 5 mg by mouth at bedtime. 07/29/24  Yes [provider]  polyethylene glycol powder (GLYCOLAX /MIRALAX ) 17 GM/SCOOP powder Take 17 g by mouth daily. Drink 17g (1 scoop) dissolved in water  per day. 03/01/22  Yes Marilynne Rosaline SAILOR, MD  pregabalin  (LYRICA ) 100 MG capsule Take 100 mg by mouth 3 (three) times daily.   Yes [provider]  rosuvastatin (CRESTOR) 20 MG tablet Take 20 mg by mouth daily. 08/10/24  Yes [provider]  Tiotropium Bromide  Monohydrate (SPIRIVA  RESPIMAT) 2.5 MCG/ACT AERS Inhale 2 puffs into the lungs daily. 07/15/24  Yes Hope Almarie ORN, NP  traMADol  (ULTRAM ) 50 MG tablet Take 2 tablets (100 mg total) by mouth 3 (three) times daily. 10/02/23  Yes Pool, Victory, MD    ROS: All other systems have been reviewed and were otherwise negative with the  exception of those mentioned in the HPI and as above.  Physical Exam: General: Alert, no acute distress Cardiovascular: No pedal edema Respiratory: No cyanosis, no use of accessory musculature GI: No organomegaly, abdomen is soft and non-tender Skin: No lesions in the area of chief complaint Neurologic: Sensation intact distally Psychiatric: Patient is competent for consent with normal mood and affect Lymphatic: No axillary or cervical lymphadenopathy  MUSCULOSKELETAL:  Right shoulder: active forward elevation to 90 degrees, passive to 100. Weakness with cuff testing.   Imaging: MRI demonstrates full thickness cartilage loss throughout the joint. High grade near complete tear of the supraspinatus.   BMI: There is no height or weight on file to calculate BMI.  Lab Results  Component Value Date   ALBUMIN 3.3 (L) 05/26/2023   Diabetes: Patient does not have a diagnosis of diabetes.     Smoking Status: Social History   Tobacco Use  Smoking Status Every Day   Current packs/day: 0.50   Average packs/day: 0.5 packs/day for 40.0 years (20.0 ttl pk-yrs)   Types: Cigarettes  Smokeless Tobacco Never  Tobacco Comments   Pt smokes 6-7 cigarettes every day. AB, CMA 07-15-24      Pt smokes 4 cigarettes a day ARJ 07/17/23   Ready to quit: Not Answered Counseling given: Not Answered Tobacco comments: Pt smokes 6-7 cigarettes every day. AB, CMA 07-15-24  Pt smokes 4 cigarettes a day ARJ 07/17/23  The patient has participated in a 4-week cessation program.           Assessment: osteoarthritis of right shoulder  Plan: Plan for Procedure(s): ARTHROPLASTY, SHOULDER, TOTAL, REVERSE  The risks benefits and alternatives were discussed with the patient including but not limited to the risks of nonoperative treatment, versus surgical intervention including infection, bleeding, nerve injury,  blood clots, cardiopulmonary complications, morbidity, mortality, among others, and they  were willing to proceed.   We additionally specifically discussed risks of axillary nerve injury, infection, periprosthetic fracture, continued pain and longevity of implants prior to beginning procedure.    Patient will be closely monitored in PACU for medical stabilization and pain control. If found  stable in PACU, patient may be discharged home with outpatient follow-up. If any concerns regarding patient's stabilization patient will be admitted for observation after surgery. The patient is planning to be discharged home with outpatient PT.   The patient acknowledged the explanation, agreed to proceed with the plan and consent was signed.   She received operative clearance from her cardiologist, DR. Branch, her PCP, Dr. Shepard, and pulmonologist, Dr. Hope  Operative Plan: Right reverse total shoulder arthroplasty Discharge Medications: standard DVT Prophylaxis: aspirin  Physical Therapy: outpatient PT Special Discharge needs: Sling (should bring with her). IceMan   Aleck LOISE Stalling, PA-C  09/21/2024 12:03 PM

## 2024-09-23 ENCOUNTER — Encounter (HOSPITAL_COMMUNITY)
Admission: RE | Disposition: A | Discharge status: Home / Self Care | Payer: Self-pay | Source: Home / Self Care | Attending: Orthopaedic Surgery

## 2024-09-23 ENCOUNTER — Ambulatory Visit (HOSPITAL_COMMUNITY): Admitting: Certified Registered"

## 2024-09-23 ENCOUNTER — Ambulatory Visit (HOSPITAL_COMMUNITY)

## 2024-09-23 ENCOUNTER — Ambulatory Visit (HOSPITAL_COMMUNITY): Payer: Self-pay | Admitting: Physician Assistant

## 2024-09-23 ENCOUNTER — Ambulatory Visit (HOSPITAL_COMMUNITY): Admission: RE | Admit: 2024-09-23 | Source: Home / Self Care | Admitting: Orthopaedic Surgery

## 2024-09-23 ENCOUNTER — Encounter (HOSPITAL_COMMUNITY): Payer: Self-pay | Admitting: Orthopaedic Surgery

## 2024-09-23 ENCOUNTER — Other Ambulatory Visit: Payer: Self-pay

## 2024-09-23 DIAGNOSIS — I252 Old myocardial infarction: Secondary | ICD-10-CM | POA: Diagnosis not present

## 2024-09-23 DIAGNOSIS — M19011 Primary osteoarthritis, right shoulder: Secondary | ICD-10-CM | POA: Diagnosis present

## 2024-09-23 DIAGNOSIS — I2510 Atherosclerotic heart disease of native coronary artery without angina pectoris: Secondary | ICD-10-CM | POA: Diagnosis not present

## 2024-09-23 DIAGNOSIS — F17210 Nicotine dependence, cigarettes, uncomplicated: Secondary | ICD-10-CM | POA: Diagnosis not present

## 2024-09-23 DIAGNOSIS — J449 Chronic obstructive pulmonary disease, unspecified: Secondary | ICD-10-CM | POA: Diagnosis not present

## 2024-09-23 DIAGNOSIS — F418 Other specified anxiety disorders: Secondary | ICD-10-CM | POA: Insufficient documentation

## 2024-09-23 HISTORY — PX: REVERSE SHOULDER ARTHROPLASTY: SHX5054

## 2024-09-23 SURGERY — ARTHROPLASTY, SHOULDER, TOTAL, REVERSE
Anesthesia: General | Site: Shoulder | Laterality: Right

## 2024-09-23 MED ORDER — ROCURONIUM BROMIDE 10 MG/ML (PF) SYRINGE
PREFILLED_SYRINGE | INTRAVENOUS | Status: AC
  Filled 2024-09-23: qty 10

## 2024-09-23 MED ORDER — 0.9 % SODIUM CHLORIDE (POUR BTL) OPTIME
TOPICAL | Status: DC | PRN
  Administered 2024-09-23: 1000 mL

## 2024-09-23 MED ORDER — SUGAMMADEX SODIUM 200 MG/2ML IV SOLN
INTRAVENOUS | Status: AC
  Filled 2024-09-23: qty 2

## 2024-09-23 MED ORDER — ROCURONIUM BROMIDE 10 MG/ML (PF) SYRINGE
PREFILLED_SYRINGE | INTRAVENOUS | Status: DC | PRN
  Administered 2024-09-23: 50 mg via INTRAVENOUS

## 2024-09-23 MED ORDER — MELOXICAM 15 MG PO TABS: 15.0000 mg | ORAL_TABLET | Freq: Every day | ORAL | 0 refills | Status: AC

## 2024-09-23 MED ORDER — PHENYLEPHRINE HCL-NACL 20-0.9 MG/250ML-% IV SOLN
INTRAVENOUS | Status: DC | PRN
  Administered 2024-09-23: 40 ug/min via INTRAVENOUS

## 2024-09-23 MED ORDER — MIDAZOLAM HCL 2 MG/2ML IJ SOLN
INTRAMUSCULAR | Status: AC
  Administered 2024-09-23: 1 mg via INTRAVENOUS
  Filled 2024-09-23: qty 2

## 2024-09-23 MED ORDER — SODIUM CHLORIDE 0.9 % IR SOLN
Status: DC | PRN
  Administered 2024-09-23: 1000 mL

## 2024-09-23 MED ORDER — STERILE WATER FOR IRRIGATION IR SOLN
Status: DC | PRN
  Administered 2024-09-23: 2000 mL

## 2024-09-23 MED ORDER — OXYCODONE HCL 5 MG PO TABS: ORAL_TABLET | ORAL | 0 refills | Status: DC

## 2024-09-23 MED ORDER — ONDANSETRON HCL 4 MG/2ML IJ SOLN
INTRAMUSCULAR | Status: DC | PRN
  Administered 2024-09-23: 4 mg via INTRAVENOUS

## 2024-09-23 MED ORDER — FENTANYL CITRATE PF 50 MCG/ML IJ SOSY: 25.0000 ug | PREFILLED_SYRINGE | INTRAMUSCULAR | Status: DC | PRN

## 2024-09-23 MED ORDER — ONDANSETRON HCL 4 MG/2ML IJ SOLN
INTRAMUSCULAR | Status: AC
  Filled 2024-09-23: qty 2

## 2024-09-23 MED ORDER — FENTANYL CITRATE (PF) 100 MCG/2ML IJ SOLN
INTRAMUSCULAR | Status: DC | PRN
  Administered 2024-09-23: 50 ug via INTRAVENOUS

## 2024-09-23 MED ORDER — PROPOFOL 10 MG/ML IV BOLUS
INTRAVENOUS | Status: DC | PRN
  Administered 2024-09-23: 120 mg via INTRAVENOUS

## 2024-09-23 MED ORDER — FENTANYL CITRATE PF 50 MCG/ML IJ SOSY
PREFILLED_SYRINGE | INTRAMUSCULAR | Status: AC
  Administered 2024-09-23: 50 ug via INTRAVENOUS
  Filled 2024-09-23: qty 1

## 2024-09-23 MED ORDER — VANCOMYCIN HCL 1 G IV SOLR
INTRAVENOUS | Status: DC | PRN
  Administered 2024-09-23: 1000 mg via TOPICAL

## 2024-09-23 MED ORDER — FENTANYL CITRATE (PF) 100 MCG/2ML IJ SOLN
INTRAMUSCULAR | Status: AC
  Filled 2024-09-23: qty 2

## 2024-09-23 MED ORDER — FENTANYL CITRATE PF 50 MCG/ML IJ SOSY
50.0000 ug | PREFILLED_SYRINGE | INTRAMUSCULAR | Status: DC
  Filled 2024-09-23: qty 1

## 2024-09-23 MED ORDER — HYDROMORPHONE HCL 2 MG PO TABS: 2.0000 mg | ORAL_TABLET | Freq: Three times a day (TID) | ORAL | 0 refills | Status: AC

## 2024-09-23 MED ORDER — OMEPRAZOLE 20 MG PO CPDR: 20.0000 mg | DELAYED_RELEASE_CAPSULE | Freq: Every day | ORAL | 0 refills | Status: AC

## 2024-09-23 MED ORDER — LACTATED RINGERS IV SOLN: INTRAVENOUS | Status: DC

## 2024-09-23 MED ORDER — MIDAZOLAM HCL 2 MG/2ML IJ SOLN: 1.0000 mg | INTRAMUSCULAR | Status: DC

## 2024-09-23 MED ORDER — ASPIRIN 81 MG PO CHEW: 81.0000 mg | CHEWABLE_TABLET | Freq: Two times a day (BID) | ORAL | 0 refills | Status: AC

## 2024-09-23 MED ORDER — ACETAMINOPHEN 500 MG PO TABS: 1000.0000 mg | ORAL_TABLET | Freq: Three times a day (TID) | ORAL | 0 refills | Status: AC

## 2024-09-23 MED ORDER — VANCOMYCIN HCL 1000 MG IV SOLR
INTRAVENOUS | Status: AC
  Filled 2024-09-23: qty 20

## 2024-09-23 MED ORDER — BUPIVACAINE LIPOSOME 1.3 % IJ SUSP
INTRAMUSCULAR | Status: DC | PRN
  Administered 2024-09-23: 10 mL via PERINEURAL

## 2024-09-23 MED ORDER — BUPIVACAINE HCL (PF) 0.5 % IJ SOLN
INTRAMUSCULAR | Status: DC | PRN
  Administered 2024-09-23: 10 mL via PERINEURAL

## 2024-09-23 MED ORDER — PROPOFOL 10 MG/ML IV BOLUS
INTRAVENOUS | Status: AC
  Filled 2024-09-23: qty 20

## 2024-09-23 MED ORDER — ACETAMINOPHEN 500 MG PO TABS
1000.0000 mg | ORAL_TABLET | Freq: Once | ORAL | Status: AC
  Administered 2024-09-23: 1000 mg via ORAL
  Filled 2024-09-23: qty 2

## 2024-09-23 MED ORDER — LIDOCAINE HCL (PF) 2 % IJ SOLN
INTRAMUSCULAR | Status: AC
  Filled 2024-09-23: qty 5

## 2024-09-23 MED ORDER — CHLORHEXIDINE GLUCONATE 0.12 % MT SOLN
15.0000 mL | Freq: Once | OROMUCOSAL | Status: AC
  Administered 2024-09-23: 15 mL via OROMUCOSAL

## 2024-09-23 MED ORDER — DEXAMETHASONE SODIUM PHOSPHATE 10 MG/ML IJ SOLN
INTRAMUSCULAR | Status: DC | PRN
  Administered 2024-09-23: 4 mg via INTRAVENOUS

## 2024-09-23 MED ORDER — CEFAZOLIN SODIUM-DEXTROSE 2-4 GM/100ML-% IV SOLN
2.0000 g | INTRAVENOUS | Status: AC
  Administered 2024-09-23: 2 g via INTRAVENOUS
  Filled 2024-09-23: qty 100

## 2024-09-23 MED ORDER — LIDOCAINE HCL (CARDIAC) PF 100 MG/5ML IV SOSY
PREFILLED_SYRINGE | INTRAVENOUS | Status: DC | PRN
  Administered 2024-09-23: 60 mg via INTRAVENOUS

## 2024-09-23 MED ORDER — ONDANSETRON HCL 4 MG/2ML IJ SOLN: 4.0000 mg | Freq: Once | INTRAMUSCULAR | Status: DC | PRN

## 2024-09-23 MED ORDER — ONDANSETRON HCL 4 MG PO TABS: 4.0000 mg | ORAL_TABLET | Freq: Three times a day (TID) | ORAL | 0 refills | Status: AC | PRN

## 2024-09-23 MED ORDER — TRANEXAMIC ACID-NACL 1000-0.7 MG/100ML-% IV SOLN
1000.0000 mg | INTRAVENOUS | Status: AC
  Administered 2024-09-23: 1000 mg via INTRAVENOUS
  Filled 2024-09-23: qty 100

## 2024-09-23 MED ORDER — SUGAMMADEX SODIUM 200 MG/2ML IV SOLN
INTRAVENOUS | Status: DC | PRN
  Administered 2024-09-23: 200 mg via INTRAVENOUS

## 2024-09-23 MED ORDER — ORAL CARE MOUTH RINSE: 15.0000 mL | Freq: Once | OROMUCOSAL | Status: AC

## 2024-09-23 MED ORDER — DEXAMETHASONE SODIUM PHOSPHATE 10 MG/ML IJ SOLN
INTRAMUSCULAR | Status: AC
  Filled 2024-09-23: qty 1

## 2024-09-23 SURGICAL SUPPLY — 54 items
BAG COUNTER SPONGE SURGICOUNT (BAG) ×1 IMPLANT
BASEPLATE GLENOID RSA 3X25 0D (Shoulder) IMPLANT
BIT DRILL 3.2 PERIPHERAL SCREW (BIT) IMPLANT
BLADE SAW SGTL 73X25 THK (BLADE) ×1 IMPLANT
CHLORAPREP W/TINT 26 (MISCELLANEOUS) ×2 IMPLANT
CLSR STERI-STRIP ANTIMIC 1/2X4 (GAUZE/BANDAGES/DRESSINGS) ×1 IMPLANT
COOLER ICEMAN CLASSIC (MISCELLANEOUS) ×1 IMPLANT
COVER BACK TABLE 60X90IN (DRAPES) ×1 IMPLANT
COVER SURGICAL LIGHT HANDLE (MISCELLANEOUS) ×1 IMPLANT
DRAPE C-ARM 42X120 X-RAY (DRAPES) IMPLANT
DRAPE INCISE IOBAN 66X45 STRL (DRAPES) ×1 IMPLANT
DRAPE POUCH INSTRU U-SHP 10X18 (DRAPES) ×1 IMPLANT
DRAPE SHEET LG 3/4 BI-LAMINATE (DRAPES) ×2 IMPLANT
DRAPE SURG ORHT 6 SPLT 77X108 (DRAPES) ×2 IMPLANT
DRSG AQUACEL AG ADV 3.5X 6 (GAUZE/BANDAGES/DRESSINGS) ×1 IMPLANT
ELECT BLADE TIP CTD 4 INCH (ELECTRODE) ×1 IMPLANT
ELECT PENCIL ROCKER SW 15FT (MISCELLANEOUS) ×1 IMPLANT
ELECT REM PT RETURN 15FT ADLT (MISCELLANEOUS) ×1 IMPLANT
FACESHIELD WRAPAROUND OR TEAM (MASK) ×3 IMPLANT
GLOVE BIO SURGEON STRL SZ 6.5 (GLOVE) ×2 IMPLANT
GLOVE BIOGEL PI IND STRL 6.5 (GLOVE) ×1 IMPLANT
GLOVE BIOGEL PI IND STRL 8 (GLOVE) ×1 IMPLANT
GLOVE ECLIPSE 8.0 STRL XLNG CF (GLOVE) ×2 IMPLANT
GOWN STRL REUS W/ TWL LRG LVL3 (GOWN DISPOSABLE) ×2 IMPLANT
GUIDEWIRE GLENOID 2.5X220 (WIRE) IMPLANT
INSERT HUM RET REV 3/4 36 +0 (Insert) IMPLANT
KIT BASIN OR (CUSTOM PROCEDURE TRAY) ×1 IMPLANT
KIT STABILIZATION SHOULDER (MISCELLANEOUS) ×1 IMPLANT
KIT TURNOVER KIT A (KITS) ×1 IMPLANT
MANIFOLD NEPTUNE II (INSTRUMENTS) ×1 IMPLANT
NS IRRIG 1000ML POUR BTL (IV SOLUTION) ×1 IMPLANT
PACK SHOULDER (CUSTOM PROCEDURE TRAY) ×1 IMPLANT
PAD COLD SHLDR WRAP-ON (PAD) ×1 IMPLANT
PIN GUIDE 3X75 SHOULDER (PIN) IMPLANT
RESTRAINT HEAD UNIVERSAL NS (MISCELLANEOUS) ×1 IMPLANT
SCREW 5.5X22 (Screw) IMPLANT
SCREW BONE 6.5X40 SM (Screw) IMPLANT
SCREW PERIPHERAL 5.0X34 (Screw) IMPLANT
SET HNDPC FAN SPRY TIP SCT (DISPOSABLE) ×1 IMPLANT
SPHERE GLENOID LAT REV 36 (Joint) IMPLANT
SPONGE T-LAP 4X18 ~~LOC~~+RFID (SPONGE) IMPLANT
STEM HUMERAL STD SHORT SZ3 (Joint) IMPLANT
STRIP CLOSURE SKIN 1/2X4 (GAUZE/BANDAGES/DRESSINGS) IMPLANT
SUCTION TUBE FRAZIER 12FR DISP (SUCTIONS) IMPLANT
SUT ETHIBOND 2 V 37 (SUTURE) ×1 IMPLANT
SUT ETHIBOND NAB CT1 #1 30IN (SUTURE) ×1 IMPLANT
SUT MNCRL AB 4-0 PS2 18 (SUTURE) ×1 IMPLANT
SUT VIC AB 0 CT1 36 (SUTURE) IMPLANT
SUT VIC AB 3-0 SH 27X BRD (SUTURE) ×1 IMPLANT
SUT VICRYL+ 3-0 36IN CT-1 (SUTURE) IMPLANT
SUTURE FIBERWR #5 38 CONV NDL (SUTURE) ×2 IMPLANT
TOWEL OR 17X26 10 PK STRL BLUE (TOWEL DISPOSABLE) ×1 IMPLANT
TUBE SUCTION HIGH CAP CLEAR NV (SUCTIONS) ×1 IMPLANT
WATER STERILE IRR 1000ML POUR (IV SOLUTION) ×2 IMPLANT

## 2024-09-23 NOTE — Anesthesia Procedure Notes (Signed)
 Anesthesia Regional Block: Interscalene brachial plexus block   Pre-Anesthetic Checklist: , timeout performed,  Correct Patient, Correct Site, Correct Laterality,  Correct Procedure, Correct Position, site marked,  Risks and benefits discussed,  Surgical consent,  Pre-op evaluation,  At surgeon's request and post-op pain management  Laterality: Right  Prep: chloraprep       Needles:  Injection technique: Single-shot  Needle Type: Echogenic Stimulator Needle     Needle Length: 9cm  Needle Gauge: 22     Additional Needles:   Procedures:,,,, ultrasound used (permanent image in chart),,    Narrative:  Start time: 09/23/2024 8:25 AM End time: 09/23/2024 8:33 AM Injection made incrementally with aspirations every 5 mL.  Performed by: Personally  Anesthesiologist: Corinne Garnette BRAVO, MD  Additional Notes: Functioning IV was confirmed and monitors were applied.  A 90mm 22ga echogenic stimulator needle was used. Sterile prep and drape, hand hygiene, and sterile gloves were used.  Negative aspiration and negative test dose prior to incremental administration of local anesthetic. The patient tolerated the procedure well.  Ultrasound guidance: relevent anatomy identified, needle position confirmed, local anesthetic spread visualized around nerve(s), vascular puncture avoided.  Image printed for medical record.

## 2024-09-23 NOTE — Anesthesia Procedure Notes (Signed)
 Procedure Name: Intubation Date/Time: 09/23/2024 9:30 AM  Performed by: Metta Andrea NOVAK, CRNAPre-anesthesia Checklist: Patient identified, Emergency Drugs available, Suction available, Patient being monitored and Timeout performed Patient Re-evaluated:Patient Re-evaluated prior to induction Oxygen Delivery Method: Circle system utilized Preoxygenation: Pre-oxygenation with 100% oxygen Induction Type: IV induction Ventilation: Mask ventilation without difficulty Laryngoscope Size: Mac and 4 Grade View: Grade I Tube type: Oral Tube size: 7.0 mm Number of attempts: 1 Airway Equipment and Method: Stylet Placement Confirmation: ETT inserted through vocal cords under direct vision, positive ETCO2 and breath sounds checked- equal and bilateral Secured at: 21 cm Tube secured with: Tape Dental Injury: Teeth and Oropharynx as per pre-operative assessment

## 2024-09-23 NOTE — Anesthesia Postprocedure Evaluation (Signed)
 Anesthesia Post Note  Patient: Alyssa [name]  Procedure(s) Performed: ARTHROPLASTY, SHOULDER, TOTAL, REVERSE (Right: Shoulder)     Patient location during evaluation: PACU Anesthesia Type: General Level of consciousness: awake and alert Pain management: pain level controlled Vital Signs Assessment: post-procedure vital signs reviewed and stable Respiratory status: spontaneous breathing, nonlabored ventilation, respiratory function stable and patient connected to nasal cannula oxygen Cardiovascular status: blood pressure returned to baseline and stable Postop Assessment: no apparent nausea or vomiting Anesthetic complications: no   No notable events documented.  Last Vitals:  Vitals:   09/23/24 1140 09/23/24 1145  BP:  127/85  Pulse: (!) 56 (!) 55  Resp: 13 16  Temp: (!) 36.1 C   SpO2: 94% 91%    Last Pain:  Vitals:   09/23/24 1145  TempSrc:   PainSc: 0-No pain                 Garnette FORBES Skillern

## 2024-09-23 NOTE — Transfer of Care (Signed)
 Immediate Anesthesia Transfer of Care Note  Patient: Alyssa [name]  Procedure(s) Performed: ARTHROPLASTY, SHOULDER, TOTAL, REVERSE (Right: Shoulder)  Patient Location: PACU  Anesthesia Type:General  Level of Consciousness: awake, drowsy, and patient cooperative  Airway & Oxygen Therapy: Patient Spontanous Breathing and Patient connected to face mask oxygen  Post-op Assessment: Report given to RN and Post -op Vital signs reviewed and stable  Post vital signs: Reviewed and stable  Last Vitals:  Vitals Value Taken Time  BP 163/88 09/23/24 10:40  Temp    Pulse 56 09/23/24 10:42  Resp 18 09/23/24 10:42  SpO2 100 % 09/23/24 10:42  Vitals shown include unfiled device data.  Last Pain:  Vitals:   09/23/24 0715  TempSrc: Oral  PainSc:          Complications: No notable events documented.

## 2024-09-23 NOTE — Discharge Instructions (Signed)
 Bonner Hair MD, MPH Aleck Stalling, PA-C [facility] [address], [address] [phone] (tel)   [phone] (fax)   POST-OPERATIVE INSTRUCTIONS - TOTAL SHOULDER REPLACEMENT    WOUND CARE You may leave the operative dressing in place until your follow-up appointment. KEEP THE INCISIONS CLEAN AND DRY. There may be a small amount of fluid/bleeding leaking at the surgical site. This is normal after surgery.  If it fills with liquid or blood please call us  immediately to change it for you. Use the provided ice machine or Ice packs as often as possible for the first 3-4 days, then as needed for pain relief.   Keep a layer of cloth or a shirt between your skin and the cooling unit to prevent frost bite as it can get very cold.  SHOWERING: - You may shower on Post-Op Day #2.  - The dressing is water  resistant but do not scrub it as it may start to peel up.   - You may remove the sling for showering - Gently pat the area dry.  - Do not soak the shoulder in water .  - Do not go swimming in the pool or ocean until your incision has completely healed (about 4-6 weeks after surgery) - KEEP THE INCISIONS CLEAN AND DRY.  EXERCISES Wear the sling at all times  You may remove the sling for showering, but keep the arm across the chest or in a secondary sling.    Accidental/Purposeful External Rotation and shoulder flexion (reaching behind you) is to be avoided at all costs for the first month. It is ok to come out of your sling if your are sitting and have assistance for eating.   Do not lift anything heavier than 1 pound until we discuss it further in clinic.  It is normal for your fingers/hand to become more swollen after surgery and discolored from bruising.   This will resolve over the first few weeks usually after surgery. Please continue to ambulate and do not stay sitting or lying for too long.  Perform foot and wrist pumps to assist in circulation.  PHYSICAL  THERAPY - You will begin physical therapy soon after surgery (unless otherwise specified) - Please call to set up an appointment, if you do not already have one  - Let our office if there are any issues with scheduling your therapy  - You have a physical therapy appointment scheduled at [facility] (across the hall from our office) on 9/26   REGIONAL ANESTHESIA (NERVE BLOCKS) The anesthesia team may have performed a nerve block for you this is a great tool used to minimize pain.   The block may start wearing off overnight (between 8-24 hours postop) When the block wears off, your pain may go from nearly zero to the pain you would have had postop without the block. This is an abrupt transition but nothing dangerous is happening.   This can be a challenging period but utilize your as needed pain medications to try and manage this period. We suggest you use the pain medication the first night prior to going to bed, to ease this transition.  You may take an extra dose of narcotic when this happens if needed   POST-OP MEDICATIONS- Multimodal approach to pain control In general your pain will be controlled with a combination of substances.  Prescriptions unless otherwise discussed are electronically sent to your pharmacy.  This is a carefully made plan we use to minimize narcotic use.  Meloxicam  - Anti-inflammatory medication taken on a scheduled basis Acetaminophen  - Non-narcotic pain medicine taken on a scheduled basis  Oxycodone  - This is a strong narcotic, to be used only on an "as needed" basis for SEVERE pain. Aspirin  81mg  - This medicine is used to minimize the risk of blood clots after surgery. Omeprazole  - daily medicine to protect your stomach while taking anti-inflammatories.   Zofran  -  take as needed for nausea   FOLLOW-UP If you develop a Fever (>101.5), Redness or Drainage from the surgical incision site, please call our office to arrange for an evaluation. Please call the office  to schedule a follow-up appointment for a wound check, 7-10 days post-operatively.  IF YOU HAVE ANY QUESTIONS, PLEASE FEEL FREE TO CALL OUR OFFICE.  HELPFUL INFORMATION  Your arm will be in a sling following surgery. You will be in this sling for the next 4 weeks.   You may be more comfortable sleeping in a semi-seated position the first few nights following surgery.  Keep a pillow propped under the elbow and forearm for comfort.  If you have a recliner type of chair it might be beneficial.  If not that is fine too, but it would be helpful to sleep propped up with pillows behind your operated shoulder as well under your elbow and forearm.  This will reduce pulling on the suture lines.  When dressing, put your operative arm in the sleeve first.  When getting undressed, take your operative arm out last.  Loose fitting, button-down shirts are recommended.  In most states it is against the law to drive while your arm is in a sling. And certainly against the law to drive while taking narcotics.  You may return to work/school in the next couple of days when you feel up to it. Desk work and typing in the sling is fine.  We suggest you use the pain medication the first night prior to going to bed, in order to ease any pain when the anesthesia wears off. You should avoid taking pain medications on an empty stomach as it will make you nauseous.  You should wean off your narcotic medicines as soon as you are able.     Most patients will be off narcotics before their first postop appointment.   Do not drink alcoholic beverages or take illicit drugs when taking pain medications.  Pain medication may make you constipated.  Below are a few solutions to try in this order: Decrease the amount of pain medication if you aren't having pain. Drink lots of decaffeinated fluids. Drink prune juice and/or each dried prunes  If the first 3 don't work start with additional solutions Take Colace - an  over-the-counter stool softener Take Senokot - an over-the-counter laxative Take Miralax  - a stronger over-the-counter laxative   Dental Antibiotics:  We require dental prophylaxis for 2 years after a shoulder replacement  Contact your surgeon for an antibiotic prescription, prior to your dental procedure.   For more information including helpful videos and documents visit our website:   [email or web address]

## 2024-09-23 NOTE — Interval H&P Note (Signed)
 All questions answered, patient wants to proceed with procedure. ? ?

## 2024-09-23 NOTE — Op Note (Signed)
 Orthopaedic Surgery Operative Note (CSN: [id number])  Alyssa [name]  1956/02/25 Date of Surgery: 09/23/2024   Diagnoses:  osteoarthritis of right shoulder   Procedure: Right reverse Total Shoulder Arthroplasty   Operative Finding Successful completion of planned procedure. Good bone quality, full thickness cartilage loss, thin superior cuff, tug test normal.  Post-operative plan: The patient will be NWB in sling.  The patient will be will be discharged from PACU if continues to be stable as was plan prior to surgery.  DVT prophylaxis Aspirin  81 mg twice daily for 6 weeks.  Pain control with PRN pain medication preferring oral medicines.  Follow up plan will be scheduled in approximately 7 days for incision check and XR.  Physical therapy to start immediately.  Implants: Tornier perform humeral size 3 standard stem, 0 polyethylene retentive, 36+3 glenosphere with a 25+3 baseplate 40 center screw and 2 peripheral locking screws  Post-Op Diagnosis: Same Surgeons:Primary: Cristy Bonner DASEN, MD Assistants:Caroline McBane, PA-C Location: [facility] ROOM 06 Anesthesia: General with Exparel  Interscalene Antibiotics: Ancef  2g preop, Vancomycin  1000mg  locally Tourniquet time: None Estimated Blood Loss: 100 Complications: None Specimens: None Implants: Implant Name Type Inv. Item Serial No. Manufacturer Lot No. LRB No. Used Action  SPHERE GLENOID LAT REV 36 - [id number] Joint SPHERE GLENOID LAT REV 36 [id number] [employer]  Right 1 Implanted  BASEPLATE GLENOID RSA 3X25 0D - [id number] Shoulder BASEPLATE GLENOID RSA 3X25 0D [id number] [employer]  Right 1 Implanted  SCREW BONE 6.5X40 SM - [id number] Screw SCREW BONE 6.5X40 SM  [employer]  Right 1 Implanted  SCREW PERIPHERAL 5.0X34 - [id number] Screw SCREW PERIPHERAL 5.0X34  [employer]  Right 1 Implanted  SCREW 5.5X22 - [id number] Screw SCREW 5.5X22  [employer]  Right 1 Implanted  STEM HUMERAL STD SHORT SZ3 - [id number] Joint STEM HUMERAL STD SHORT SZ3  [id number] [employer]  Right 1 Implanted  INSERT HUM RET REV 3/4 36 +0 - [id number] Insert INSERT HUM RET REV 3/4 36 +0 [id number] [employer]  Right 1 Implanted    Indications for Surgery:   Alyssa [name] is a 68 y.o. female with arthritis and cuff thinning.  Benefits and risks of operative and nonoperative management were discussed prior to surgery with patient/guardian(s) and informed consent form was completed.  Infection and need for further surgery were discussed as was prosthetic stability and cuff issues.  We additionally specifically discussed risks of axillary nerve injury, infection, periprosthetic fracture, continued pain and longevity of implants prior to beginning procedure.      Procedure:   The patient was identified in the preoperative holding area where the surgical site was marked. Block placed by anesthesia with exparel .  The patient was taken to the OR where a procedural timeout was called and the above noted anesthesia was induced.  The patient was positioned beachchair on allen table with spider arm positioner.  Preoperative antibiotics were dosed.  The patient's right shoulder was prepped and draped in the usual sterile fashion.  A second preoperative timeout was called.       Standard deltopectoral approach was performed with a #10 blade. We dissected down to the subcutaneous tissues and the cephalic vein was taken laterally with the deltoid. Clavipectoral fascia was incised in line with the incision. Deep retractors were placed. The long of the biceps tendon was identified and there was significant tenosynovitis present.  Tenodesis was performed to the pectoralis tendon with #2 Ethibond. The remaining biceps was followed up into the rotator interval where  it was released.   The subscapularis was taken down in a full thickness layer with capsule along the humeral neck extending inferiorly around the humeral head. We continued releasing the capsule directly off of the  osteophytes inferiorly all the way around the corner. This allowed us  to dislocate the humeral head.   There were osteophytes along the inferior humeral neck. The osteophytes were removed with an osteotome and a rongeur.  Osteophytes were removed with a rongeur and an osteotome and the anatomic neck was well visualized.     A humeral cutting guide was used extra medullary with a pin to help control version. The version was set at 20 of retroversion. Humeral osteotomy was performed with an oscillating saw. The head fragment was passed off the back table.  A cut protector plate was placed.  The subscapularis was again identified and immediately we took care to palpate the axillary nerve anteriorly and verify its position with gentle palpation as well as the tug test.  We then released the SGHL with bovie cautery prior to placing a curved mayo at the junction of the anterior glenoid well above the axillary nerve and bluntly dissecting the subscapularis from the capsule.  We then carefully protected the axillary nerve as we gently released the inferior capsule to fully mobilize the subscapularis.  An anterior deltoid retractor was then placed as well as a small Hohmann retractor superiorly.   The remaining labrum was removed circumferentially taking great care not to disrupt the posterior capsule.   The glenoid drill guide was placed and used to drill a guide pin in the center, inferior position. The glenoid face was then reamed concentrically over the guide wire. The center hole was drilled over the guidepin in a near anatomic angle of version. Next the glenoid vault was drilled back and the vault was intact.  We were able to place our baseplate and 2 peripheral locking screws.  The base plate was screwed into the glenoid vault obtaining secure fixation. We next placed superior and inferior locking screws for additional fixation.  Glenosphere was attached as above size list.  The center screw was  tightened.  We turned attention back to the humeral side. The cut protector was removed.  We then reamed for the central ball of the implant.  We sized as above.  Trial was obtained good stability and we selected real implants.  The shoulder was trialed.  There was good ROM in all planes and the shoulder was stable with no inferior translation.  The real humeral implants were opened after again confirming sizes.  The trial was removed. #5 Fiberwire x4 sutures passed through the humeral neck for subscap repair. The humeral component was press-fit obtaining a secure fit. The joint was reduced and thoroughly irrigated with pulsatile lavage. Subscap was repaired back with #5 Fiberwire sutures through bone tunnels. Hemostasis was obtained. The deltopectoral interval was reapproximated with #1 Ethibond. The subcutaneous tissues were closed with 2-0 Vicryl and the skin was closed with running monocryl.    The wounds were cleaned and dried and an Aquacel dressing was placed. The drapes taken down. The arm was placed into sling with abduction pillow. Patient was awakened, extubated, and transferred to the recovery room in stable condition. There were no intraoperative complications. The sponge, needle, and attention counts were  correct at the end of the case.     Aleck Stalling, PA-C, present and scrubbed throughout the case, critical for completion in a timely fashion, and for  retraction, instrumentation, closure.

## 2024-09-24 ENCOUNTER — Encounter (HOSPITAL_COMMUNITY): Payer: Self-pay | Admitting: Orthopaedic Surgery

## 2024-11-09 ENCOUNTER — Other Ambulatory Visit: Payer: Self-pay | Admitting: Emergency Medicine

## 2024-12-16 ENCOUNTER — Encounter: Payer: Self-pay | Admitting: Emergency Medicine

## 2024-12-16 ENCOUNTER — Ambulatory Visit: Admitting: Emergency Medicine

## 2024-12-16 VITALS — BP 122/75 | HR 65 | Temp 98.1°F | Ht 65.0 in | Wt 157.0 lb

## 2024-12-16 DIAGNOSIS — Z720 Tobacco use: Secondary | ICD-10-CM

## 2024-12-16 DIAGNOSIS — F17210 Nicotine dependence, cigarettes, uncomplicated: Secondary | ICD-10-CM

## 2024-12-16 DIAGNOSIS — J449 Chronic obstructive pulmonary disease, unspecified: Secondary | ICD-10-CM | POA: Diagnosis not present

## 2024-12-16 DIAGNOSIS — R918 Other nonspecific abnormal finding of lung field: Secondary | ICD-10-CM

## 2024-12-16 NOTE — Patient Instructions (Addendum)
 Congratulations on decreasing your cigarettes.  We talked today about possibly setting a quit date at some point in the future.  If you decide you would like to do so we can consider starting medications to help decrease the cravings, strategies to succeed in stopping We will send a referral to our clinical pharmacist to see if you have an insurance deductible for and how medication, whether there are any more affordable inhaled medications that we could use for your COPD Keep your albuterol  available to use 2 puffs when needed for shortness of breath, chest tightness, wheezing. Flu shot is up-to-date Get your repeat lung cancer screening CT scan of the chest in June as planned Follow Dr. Shelah at the end of June so we can review your scan and your symptoms.  Call sooner if you have any problems

## 2024-12-16 NOTE — Progress Notes (Signed)
° °  Subjective:    Patient ID: Alyssa [name], female    DOB: [birth year], 68 y.o.   MRN: [id number]  COPD Her past medical history is significant for COPD.   ROV 12/16/2024 --Chosen is a 68 year old woman with a history of tobacco use and moderate COPD, pulmonary nodules that we have followed on chest imaging.  She deals with chronic cough and mucus production.  She had shoulder surgery 09/23/2024. She has some cough in the am when she gets up. She has good functional capacity but dyspnea w stairs and hills. No wheeze, minimal sputum. She has had some benefit from spiriva  in the past but costly - med deductible.  Uses albuterol  about 2x a month. Smoking 5-6 cig a day. She does want to quit. Has never used chantix or wellbutrin. Flu shot up to date.   Lung cancer screening CT chest 06/16/2024 reviewed by me showed no mediastinal or hilar adenopathy, mild centrilobular emphysema with some biapical pleural-parenchymal scarring and inflammatory changes consistent with respiratory bronchiolitis.  Unchanged area of clustered nodularity in the medial left lower lobe 9 mm.  RADS 2 study with plans for repeat in 12 months.   Review of Systems As per HPI      Objective:   Physical Exam Vitals:   12/16/24 1451  BP: 122/75  Pulse: 65  Temp: 98.1 F (36.7 C)  TempSrc: Oral  SpO2: 96%  Weight: 157 lb (71.2 kg)  Height: 5' 5 (1.651 m)     Gen: Pleasant, well-nourished, in no distress, somewhat blunted affect  ENT: No lesions,  mouth clear,  oropharynx clear, no postnasal drip  Neck: No JVD, no stridor  Lungs: No use of accessory muscles, no crackles or wheezing on normal respiration, no wheeze on forced expiration  Cardiovascular: RRR, heart sounds normal, no murmur or gallops, no peripheral edema  Musculoskeletal: No deformities, no cyanosis or clubbing  Neuro: alert, awake, non focal  Skin: Warm, no lesions or rash      Assessment & Plan:  Tobacco use Congratulations on  decreasing your cigarettes.  We talked today about possibly setting a quit date at some point in the future.  If you decide you would like to do so we can consider starting medications to help decrease the cravings, strategies to succeed in stopping  COPD (chronic obstructive pulmonary disease) (HCC) We will send a referral to our clinical pharmacist to see if you have an insurance deductible for and how medication, whether there are any more affordable inhaled medications that we could use for your COPD Keep your albuterol  available to use 2 puffs when needed for shortness of breath, chest tightness, wheezing. Flu shot is up-to-date Follow Dr. Shelah at the end of June so we can review your scan and your symptoms.  Call sooner if you have any problems  Pulmonary nodules Get your repeat lung cancer screening CT scan of the chest in June as planned  I personally spent a total of 31 minutes in the care of the patient today including preparing to see the patient, getting/reviewing separately obtained history, performing a medically appropriate exam/evaluation, counseling and educating, placing orders, documenting clinical information in the EHR, independently interpreting results, and communicating results.    Lamar Shelah, MD, PhD 12/16/2024, 3:12 PM [facility] [phone] or if no answer before 7:00PM call [phone] For any issues after 7:00PM please call eLink [phone]

## 2024-12-16 NOTE — Assessment & Plan Note (Signed)
 Congratulations on decreasing your cigarettes.  We talked today about possibly setting a quit date at some point in the future.  If you decide you would like to do so we can consider starting medications to help decrease the cravings, strategies to succeed in stopping

## 2024-12-16 NOTE — Assessment & Plan Note (Signed)
 Get your repeat lung cancer screening CT scan of the chest in June as planned

## 2024-12-16 NOTE — Assessment & Plan Note (Signed)
 We will send a referral to our clinical pharmacist to see if you have an insurance deductible for and how medication, whether there are any more affordable inhaled medications that we could use for your COPD Keep your albuterol  available to use 2 puffs when needed for shortness of breath, chest tightness, wheezing. Flu shot is up-to-date Follow Dr. Shelah at the end of June so we can review your scan and your symptoms.  Call sooner if you have any problems

## 2024-12-18 ENCOUNTER — Telehealth: Payer: Self-pay

## 2024-12-18 NOTE — Telephone Encounter (Signed)
 Received the following referral -   Would like to know if she has an medication insurance deductible.  She had been on Spiriva  but unaffordable, are there other BD options for us  for her COPD

## 2024-12-22 ENCOUNTER — Other Ambulatory Visit (HOSPITAL_COMMUNITY): Payer: Self-pay

## 2024-12-22 NOTE — Telephone Encounter (Signed)
 Dr. Shelah, Please see note from pharmacy team regarding covered inhaler.  Thank you.

## 2024-12-23 NOTE — Telephone Encounter (Signed)
 Spiriva  remains the most affordable inhaler for her so would continue it. Unfortunately she is dealing with medication deductible

## 2024-12-28 MED ORDER — SPIRIVA RESPIMAT 2.5 MCG/ACT IN AERS: 2.0000 | INHALATION_SPRAY | Freq: Every day | RESPIRATORY_TRACT | 5 refills | Status: AC

## 2024-12-28 NOTE — Telephone Encounter (Signed)
 Pt is aware of RB note and rx has been sent to pharmacy. Nothing further needed.

## 2025-06-16 ENCOUNTER — Ambulatory Visit: Admitting: Emergency Medicine
